# Patient Record
Sex: Female | Born: 1985 | Race: Black or African American | Hispanic: No | Marital: Married | Smoking: Never smoker
Health system: Southern US, Community
[De-identification: ages and names within clinical notes are randomized; demographics above are authoritative.]

## PROBLEM LIST (undated history)

## (undated) ENCOUNTER — Inpatient Hospital Stay (HOSPITAL_COMMUNITY): Payer: Self-pay

## (undated) DIAGNOSIS — B958 Unspecified staphylococcus as the cause of diseases classified elsewhere: Secondary | ICD-10-CM

## (undated) DIAGNOSIS — Z789 Other specified health status: Secondary | ICD-10-CM

## (undated) DIAGNOSIS — N200 Calculus of kidney: Secondary | ICD-10-CM

## (undated) DIAGNOSIS — N390 Urinary tract infection, site not specified: Secondary | ICD-10-CM

## (undated) DIAGNOSIS — I10 Essential (primary) hypertension: Secondary | ICD-10-CM

## (undated) DIAGNOSIS — Z3490 Encounter for supervision of normal pregnancy, unspecified, unspecified trimester: Secondary | ICD-10-CM

## (undated) DIAGNOSIS — Z87442 Personal history of urinary calculi: Secondary | ICD-10-CM

## (undated) DIAGNOSIS — Z973 Presence of spectacles and contact lenses: Secondary | ICD-10-CM

## (undated) DIAGNOSIS — T148XXA Other injury of unspecified body region, initial encounter: Secondary | ICD-10-CM

## (undated) DIAGNOSIS — N319 Neuromuscular dysfunction of bladder, unspecified: Secondary | ICD-10-CM

## (undated) DIAGNOSIS — Z8679 Personal history of other diseases of the circulatory system: Secondary | ICD-10-CM

## (undated) DIAGNOSIS — N329 Bladder disorder, unspecified: Secondary | ICD-10-CM

## (undated) HISTORY — PX: INTERSTIM IMPLANT REMOVAL: SHX5131

## (undated) HISTORY — PX: OTHER SURGICAL HISTORY: SHX169

## (undated) HISTORY — PX: SPINE SURGERY: SHX786

## (undated) HISTORY — PX: INDUCED ABORTION: SHX677

## (undated) HISTORY — PX: INTERSTIM IMPLANT PLACEMENT: SHX5130

## (undated) HISTORY — PX: EXTRACORPOREAL SHOCK WAVE LITHOTRIPSY: SHX1557

## (undated) HISTORY — PX: WISDOM TOOTH EXTRACTION: SHX21

## (undated) HISTORY — PX: LITHOTRIPSY: SUR834

## (undated) HISTORY — PX: ENDOMETRIAL ABLATION: SHX621

---
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
## 2006-02-21 ENCOUNTER — Emergency Department (HOSPITAL_COMMUNITY): Admission: EM | Admit: 2006-02-21 | Discharge: 2006-02-21 | Payer: Self-pay | Admitting: Family Medicine

## 2006-05-14 ENCOUNTER — Emergency Department (HOSPITAL_COMMUNITY): Admission: EM | Admit: 2006-05-14 | Discharge: 2006-05-14 | Payer: Self-pay | Admitting: Emergency Medicine

## 2006-10-21 ENCOUNTER — Emergency Department (HOSPITAL_COMMUNITY): Admission: EM | Admit: 2006-10-21 | Discharge: 2006-10-21 | Payer: Self-pay | Admitting: Family Medicine

## 2006-12-08 ENCOUNTER — Inpatient Hospital Stay (HOSPITAL_COMMUNITY): Admission: AD | Admit: 2006-12-08 | Discharge: 2006-12-08 | Payer: Self-pay | Admitting: Obstetrics & Gynecology

## 2006-12-24 ENCOUNTER — Ambulatory Visit: Payer: Self-pay | Admitting: Family Medicine

## 2006-12-24 ENCOUNTER — Encounter (INDEPENDENT_AMBULATORY_CARE_PROVIDER_SITE_OTHER): Payer: Self-pay | Admitting: Family Medicine

## 2006-12-24 LAB — CONVERTED CEMR LAB
Antibody Screen: NEGATIVE
Basophils Absolute: 0 10*3/uL (ref 0.0–0.1)
Basophils Relative: 0 % (ref 0–1)
Eosinophils Absolute: 0.1 10*3/uL — ABNORMAL LOW (ref 0.2–0.7)
Eosinophils Relative: 2 % (ref 0–5)
HCT: 37.3 % (ref 36.0–46.0)
Hemoglobin: 12 g/dL (ref 12.0–15.0)
Hepatitis B Surface Ag: NEGATIVE
Lymphocytes Relative: 33 % (ref 12–46)
Lymphs Abs: 1.5 10*3/uL (ref 0.7–4.0)
MCHC: 32.2 g/dL (ref 30.0–36.0)
MCV: 67.8 fL — ABNORMAL LOW (ref 78.0–100.0)
Monocytes Absolute: 0.3 10*3/uL (ref 0.1–1.0)
Monocytes Relative: 7 % (ref 3–12)
Neutro Abs: 2.6 10*3/uL (ref 1.7–7.7)
Neutrophils Relative %: 58 % (ref 43–77)
Platelets: 321 10*3/uL (ref 150–400)
RBC: 5.5 M/uL — ABNORMAL HIGH (ref 3.87–5.11)
RDW: 19.1 % — ABNORMAL HIGH (ref 11.5–15.5)
Rh Type: POSITIVE
Rubella: 91 intl units/mL — ABNORMAL HIGH
WBC: 4.5 10*3/uL (ref 4.0–10.5)

## 2006-12-25 ENCOUNTER — Encounter (INDEPENDENT_AMBULATORY_CARE_PROVIDER_SITE_OTHER): Payer: Self-pay | Admitting: Family Medicine

## 2006-12-31 ENCOUNTER — Encounter (INDEPENDENT_AMBULATORY_CARE_PROVIDER_SITE_OTHER): Payer: Self-pay | Admitting: Family Medicine

## 2006-12-31 ENCOUNTER — Encounter: Payer: Self-pay | Admitting: Family Medicine

## 2006-12-31 ENCOUNTER — Ambulatory Visit: Payer: Self-pay | Admitting: Family Medicine

## 2006-12-31 LAB — CONVERTED CEMR LAB: GC Culture Only: NEGATIVE

## 2007-01-01 LAB — CONVERTED CEMR LAB: GC Probe Amp, Genital: NEGATIVE

## 2007-01-27 ENCOUNTER — Telehealth (INDEPENDENT_AMBULATORY_CARE_PROVIDER_SITE_OTHER): Payer: Self-pay | Admitting: Family Medicine

## 2007-01-27 ENCOUNTER — Inpatient Hospital Stay (HOSPITAL_COMMUNITY): Admission: AD | Admit: 2007-01-27 | Discharge: 2007-01-28 | Payer: Self-pay | Admitting: Obstetrics & Gynecology

## 2007-01-28 ENCOUNTER — Encounter (INDEPENDENT_AMBULATORY_CARE_PROVIDER_SITE_OTHER): Payer: Self-pay | Admitting: *Deleted

## 2007-01-28 ENCOUNTER — Telehealth (INDEPENDENT_AMBULATORY_CARE_PROVIDER_SITE_OTHER): Payer: Self-pay | Admitting: Family Medicine

## 2007-01-29 ENCOUNTER — Telehealth: Payer: Self-pay | Admitting: *Deleted

## 2007-01-29 ENCOUNTER — Inpatient Hospital Stay (HOSPITAL_COMMUNITY): Admission: AD | Admit: 2007-01-29 | Discharge: 2007-01-29 | Payer: Self-pay | Admitting: Obstetrics & Gynecology

## 2007-01-30 ENCOUNTER — Inpatient Hospital Stay (HOSPITAL_COMMUNITY): Admission: AD | Admit: 2007-01-30 | Discharge: 2007-01-30 | Payer: Self-pay | Admitting: Obstetrics & Gynecology

## 2007-02-01 ENCOUNTER — Encounter (INDEPENDENT_AMBULATORY_CARE_PROVIDER_SITE_OTHER): Payer: Self-pay | Admitting: Family Medicine

## 2007-02-02 ENCOUNTER — Telehealth (INDEPENDENT_AMBULATORY_CARE_PROVIDER_SITE_OTHER): Payer: Self-pay | Admitting: Family Medicine

## 2007-04-28 ENCOUNTER — Inpatient Hospital Stay (HOSPITAL_COMMUNITY): Admission: AD | Admit: 2007-04-28 | Discharge: 2007-04-28 | Payer: Self-pay | Admitting: Obstetrics & Gynecology

## 2007-05-01 ENCOUNTER — Inpatient Hospital Stay (HOSPITAL_COMMUNITY): Admission: AD | Admit: 2007-05-01 | Discharge: 2007-05-01 | Payer: Self-pay | Admitting: Gynecology

## 2007-05-08 ENCOUNTER — Inpatient Hospital Stay (HOSPITAL_COMMUNITY): Admission: RE | Admit: 2007-05-08 | Discharge: 2007-05-08 | Payer: Self-pay | Admitting: Obstetrics & Gynecology

## 2007-06-09 ENCOUNTER — Inpatient Hospital Stay (HOSPITAL_COMMUNITY): Admission: AD | Admit: 2007-06-09 | Discharge: 2007-06-10 | Payer: Self-pay | Admitting: Obstetrics

## 2007-06-14 ENCOUNTER — Inpatient Hospital Stay (HOSPITAL_COMMUNITY): Admission: AD | Admit: 2007-06-14 | Discharge: 2007-06-14 | Payer: Self-pay | Admitting: Obstetrics

## 2007-06-16 ENCOUNTER — Observation Stay (HOSPITAL_COMMUNITY): Admission: AD | Admit: 2007-06-16 | Discharge: 2007-06-19 | Payer: Self-pay | Admitting: Obstetrics & Gynecology

## 2007-06-29 ENCOUNTER — Inpatient Hospital Stay (HOSPITAL_COMMUNITY): Admission: AD | Admit: 2007-06-29 | Discharge: 2007-06-29 | Payer: Self-pay | Admitting: Obstetrics & Gynecology

## 2007-07-24 ENCOUNTER — Inpatient Hospital Stay (HOSPITAL_COMMUNITY): Admission: AD | Admit: 2007-07-24 | Discharge: 2007-07-25 | Payer: Self-pay | Admitting: Obstetrics & Gynecology

## 2007-08-03 ENCOUNTER — Inpatient Hospital Stay (HOSPITAL_COMMUNITY): Admission: AD | Admit: 2007-08-03 | Discharge: 2007-08-03 | Payer: Self-pay | Admitting: Obstetrics

## 2007-08-05 ENCOUNTER — Encounter: Payer: Self-pay | Admitting: Obstetrics & Gynecology

## 2007-08-05 ENCOUNTER — Ambulatory Visit (HOSPITAL_COMMUNITY): Admission: RE | Admit: 2007-08-05 | Discharge: 2007-08-05 | Payer: Self-pay | Admitting: Obstetrics

## 2007-08-05 ENCOUNTER — Observation Stay (HOSPITAL_COMMUNITY): Admission: AD | Admit: 2007-08-05 | Discharge: 2007-08-06 | Payer: Self-pay | Admitting: Obstetrics & Gynecology

## 2007-09-24 ENCOUNTER — Emergency Department (HOSPITAL_COMMUNITY): Admission: EM | Admit: 2007-09-24 | Discharge: 2007-09-24 | Payer: Self-pay | Admitting: Emergency Medicine

## 2007-11-20 ENCOUNTER — Emergency Department (HOSPITAL_COMMUNITY): Admission: EM | Admit: 2007-11-20 | Discharge: 2007-11-20 | Payer: Self-pay | Admitting: Family Medicine

## 2007-11-26 ENCOUNTER — Emergency Department (HOSPITAL_COMMUNITY): Admission: EM | Admit: 2007-11-26 | Discharge: 2007-11-26 | Payer: Self-pay | Admitting: Emergency Medicine

## 2007-12-25 ENCOUNTER — Emergency Department (HOSPITAL_COMMUNITY): Admission: EM | Admit: 2007-12-25 | Discharge: 2007-12-25 | Payer: Self-pay | Admitting: Family Medicine

## 2008-03-18 ENCOUNTER — Emergency Department (HOSPITAL_COMMUNITY): Admission: EM | Admit: 2008-03-18 | Discharge: 2008-03-18 | Payer: Self-pay | Admitting: Emergency Medicine

## 2008-04-06 ENCOUNTER — Inpatient Hospital Stay (HOSPITAL_COMMUNITY): Admission: AD | Admit: 2008-04-06 | Discharge: 2008-04-06 | Payer: Self-pay | Admitting: Obstetrics & Gynecology

## 2008-04-09 ENCOUNTER — Inpatient Hospital Stay (HOSPITAL_COMMUNITY): Admission: AD | Admit: 2008-04-09 | Discharge: 2008-04-09 | Payer: Self-pay | Admitting: Obstetrics & Gynecology

## 2008-05-26 ENCOUNTER — Inpatient Hospital Stay (HOSPITAL_COMMUNITY): Admission: AD | Admit: 2008-05-26 | Discharge: 2008-05-26 | Payer: Self-pay | Admitting: Obstetrics & Gynecology

## 2008-06-15 ENCOUNTER — Encounter (INDEPENDENT_AMBULATORY_CARE_PROVIDER_SITE_OTHER): Payer: Self-pay | Admitting: Family Medicine

## 2008-06-15 ENCOUNTER — Ambulatory Visit: Payer: Self-pay | Admitting: Family Medicine

## 2008-06-15 DIAGNOSIS — N912 Amenorrhea, unspecified: Secondary | ICD-10-CM

## 2008-06-15 LAB — CONVERTED CEMR LAB
Antibody Screen: NEGATIVE
Anticardiolipin IgG: <7 (ref <11)
Anticardiolipin IgM: <7 (ref <10)
Basophils Absolute: 0 10*3/uL (ref 0.0–0.1)
Basophils Relative: 0 % (ref 0–1)
Beta hcg, urine, semiquantitative: POSITIVE
Eosinophils Absolute: 0.1 10*3/uL (ref 0.0–0.7)
Eosinophils Relative: 2 % (ref 0–5)
Hemoglobin: 11.7 g/dL — ABNORMAL LOW (ref 12.0–15.0)
Lymphocytes Relative: 36 % (ref 12–46)
Lymphs Abs: 1.8 10*3/uL (ref 0.7–4.0)
MCV: 67.9 fL — ABNORMAL LOW (ref 78.0–100.0)
Monocytes Relative: 7 % (ref 3–12)
Platelets: 296 10*3/uL (ref 150–400)
RBC: 4.96 M/uL (ref 3.87–5.11)
Rubella: 85 intl units/mL — ABNORMAL HIGH
Sickle Cell Screen: NEGATIVE

## 2008-06-16 ENCOUNTER — Encounter: Payer: Self-pay | Admitting: *Deleted

## 2008-06-16 ENCOUNTER — Encounter (INDEPENDENT_AMBULATORY_CARE_PROVIDER_SITE_OTHER): Payer: Self-pay | Admitting: Family Medicine

## 2008-06-18 ENCOUNTER — Ambulatory Visit (HOSPITAL_COMMUNITY): Admission: RE | Admit: 2008-06-18 | Discharge: 2008-06-18 | Payer: Self-pay | Admitting: Family Medicine

## 2008-06-28 ENCOUNTER — Telehealth (INDEPENDENT_AMBULATORY_CARE_PROVIDER_SITE_OTHER): Payer: Self-pay | Admitting: Family Medicine

## 2008-07-07 ENCOUNTER — Telehealth: Payer: Self-pay | Admitting: Family Medicine

## 2008-07-08 ENCOUNTER — Encounter: Payer: Self-pay | Admitting: Family Medicine

## 2008-07-08 ENCOUNTER — Other Ambulatory Visit: Admission: RE | Admit: 2008-07-08 | Discharge: 2008-07-08 | Payer: Self-pay | Admitting: Family Medicine

## 2008-07-08 ENCOUNTER — Ambulatory Visit: Payer: Self-pay | Admitting: Family Medicine

## 2008-07-08 DIAGNOSIS — O211 Hyperemesis gravidarum with metabolic disturbance: Secondary | ICD-10-CM | POA: Insufficient documentation

## 2008-07-08 LAB — CONVERTED CEMR LAB: Chlamydia, DNA Probe: NEGATIVE

## 2008-07-21 ENCOUNTER — Inpatient Hospital Stay (HOSPITAL_COMMUNITY): Admission: AD | Admit: 2008-07-21 | Discharge: 2008-07-21 | Payer: Self-pay | Admitting: Obstetrics & Gynecology

## 2008-07-21 ENCOUNTER — Ambulatory Visit: Payer: Self-pay | Admitting: Family

## 2008-07-22 ENCOUNTER — Ambulatory Visit: Payer: Self-pay | Admitting: Obstetrics & Gynecology

## 2008-07-22 ENCOUNTER — Encounter: Payer: Self-pay | Admitting: Family Medicine

## 2008-07-22 ENCOUNTER — Ambulatory Visit (HOSPITAL_COMMUNITY): Admission: RE | Admit: 2008-07-22 | Discharge: 2008-07-22 | Payer: Self-pay | Admitting: Obstetrics & Gynecology

## 2008-07-22 ENCOUNTER — Encounter: Payer: Self-pay | Admitting: Physician Assistant

## 2008-07-22 LAB — CONVERTED CEMR LAB
Trich, Wet Prep: NONE SEEN
WBC, Wet Prep HPF POC: NONE SEEN
Yeast Wet Prep HPF POC: NONE SEEN

## 2008-08-02 ENCOUNTER — Ambulatory Visit: Payer: Self-pay | Admitting: Obstetrics & Gynecology

## 2008-08-16 ENCOUNTER — Ambulatory Visit: Payer: Self-pay | Admitting: Obstetrics & Gynecology

## 2008-09-06 ENCOUNTER — Ambulatory Visit: Payer: Self-pay | Admitting: Obstetrics & Gynecology

## 2008-09-07 ENCOUNTER — Encounter: Payer: Self-pay | Admitting: Obstetrics & Gynecology

## 2008-09-09 ENCOUNTER — Ambulatory Visit (HOSPITAL_COMMUNITY): Admission: RE | Admit: 2008-09-09 | Discharge: 2008-09-09 | Payer: Self-pay | Admitting: Obstetrics & Gynecology

## 2008-10-04 ENCOUNTER — Ambulatory Visit: Payer: Self-pay | Admitting: Obstetrics & Gynecology

## 2008-11-01 ENCOUNTER — Ambulatory Visit: Payer: Self-pay | Admitting: Obstetrics & Gynecology

## 2008-11-15 ENCOUNTER — Ambulatory Visit: Payer: Self-pay | Admitting: Obstetrics & Gynecology

## 2008-12-01 ENCOUNTER — Ambulatory Visit: Payer: Self-pay | Admitting: Obstetrics & Gynecology

## 2008-12-01 ENCOUNTER — Encounter: Payer: Self-pay | Admitting: Physician Assistant

## 2008-12-01 LAB — CONVERTED CEMR LAB
MCV: 75.1 fL — ABNORMAL LOW (ref 78.0–100.0)
RBC: 4.33 M/uL (ref 3.87–5.11)
WBC: 6.4 10*3/uL (ref 4.0–10.5)

## 2008-12-08 ENCOUNTER — Encounter: Payer: Self-pay | Admitting: Obstetrics & Gynecology

## 2008-12-15 ENCOUNTER — Ambulatory Visit: Payer: Self-pay | Admitting: Obstetrics and Gynecology

## 2008-12-16 ENCOUNTER — Ambulatory Visit (HOSPITAL_COMMUNITY): Admission: RE | Admit: 2008-12-16 | Discharge: 2008-12-16 | Payer: Self-pay | Admitting: Obstetrics and Gynecology

## 2008-12-29 ENCOUNTER — Ambulatory Visit: Payer: Self-pay | Admitting: Obstetrics & Gynecology

## 2008-12-29 ENCOUNTER — Encounter: Payer: Self-pay | Admitting: Physician Assistant

## 2008-12-31 ENCOUNTER — Inpatient Hospital Stay (HOSPITAL_COMMUNITY): Admission: AD | Admit: 2008-12-31 | Discharge: 2008-12-31 | Payer: Self-pay | Admitting: Obstetrics and Gynecology

## 2009-01-12 ENCOUNTER — Encounter: Payer: Self-pay | Admitting: Advanced Practice Midwife

## 2009-01-12 ENCOUNTER — Ambulatory Visit: Payer: Self-pay | Admitting: Obstetrics and Gynecology

## 2009-01-17 ENCOUNTER — Ambulatory Visit: Payer: Self-pay | Admitting: Advanced Practice Midwife

## 2009-01-17 ENCOUNTER — Inpatient Hospital Stay (HOSPITAL_COMMUNITY): Admission: AD | Admit: 2009-01-17 | Discharge: 2009-01-17 | Payer: Self-pay | Admitting: Obstetrics & Gynecology

## 2009-01-18 ENCOUNTER — Inpatient Hospital Stay (HOSPITAL_COMMUNITY): Admission: AD | Admit: 2009-01-18 | Discharge: 2009-01-18 | Payer: Self-pay | Admitting: Obstetrics and Gynecology

## 2009-01-19 ENCOUNTER — Ambulatory Visit: Payer: Self-pay | Admitting: Obstetrics and Gynecology

## 2009-01-26 ENCOUNTER — Ambulatory Visit: Payer: Self-pay | Admitting: Obstetrics and Gynecology

## 2009-01-26 LAB — CONVERTED CEMR LAB
Chlamydia, DNA Probe: NEGATIVE
GC Probe Amp, Genital: NEGATIVE

## 2009-02-02 ENCOUNTER — Ambulatory Visit: Payer: Self-pay | Admitting: Obstetrics and Gynecology

## 2009-02-09 ENCOUNTER — Ambulatory Visit: Payer: Self-pay | Admitting: Obstetrics and Gynecology

## 2009-02-10 ENCOUNTER — Inpatient Hospital Stay (HOSPITAL_COMMUNITY): Admission: AD | Admit: 2009-02-10 | Discharge: 2009-02-12 | Payer: Self-pay | Admitting: Family Medicine

## 2009-02-10 ENCOUNTER — Encounter: Payer: Self-pay | Admitting: Family Medicine

## 2009-02-10 ENCOUNTER — Ambulatory Visit: Payer: Self-pay | Admitting: Advanced Practice Midwife

## 2009-02-10 DIAGNOSIS — R339 Retention of urine, unspecified: Secondary | ICD-10-CM

## 2009-03-03 ENCOUNTER — Encounter: Payer: Self-pay | Admitting: Family Medicine

## 2009-03-04 ENCOUNTER — Encounter: Payer: Self-pay | Admitting: Family Medicine

## 2010-03-14 NOTE — Consult Note (Signed)
[facility]  [facility]   Imported By: Clydell Hakim 03/07/2009 11:45:14  _____________________________________________________________________  External Attachment:    Type:   Image     Comment:   External Document

## 2010-03-14 NOTE — Consult Note (Signed)
[facility]   Imported By: Clydell Hakim 03/11/2009 13:59:26  _____________________________________________________________________  External Attachment:    Type:   Image     Comment:   External Document

## 2010-04-14 ENCOUNTER — Inpatient Hospital Stay (HOSPITAL_COMMUNITY)
Admission: AD | Admit: 2010-04-14 | Discharge: 2010-04-14 | Disposition: A | Discharge status: Home / Self Care | Payer: Medicaid Other | Source: Ambulatory Visit | Attending: Obstetrics & Gynecology | Admitting: Obstetrics & Gynecology

## 2010-04-14 DIAGNOSIS — N390 Urinary tract infection, site not specified: Secondary | ICD-10-CM | POA: Insufficient documentation

## 2010-04-14 DIAGNOSIS — R109 Unspecified abdominal pain: Secondary | ICD-10-CM | POA: Insufficient documentation

## 2010-04-14 LAB — URINALYSIS, ROUTINE W REFLEX MICROSCOPIC
Bilirubin Urine: NEGATIVE
Glucose, UA: NEGATIVE mg/dL
Hgb urine dipstick: NEGATIVE
Specific Gravity, Urine: 1.02 (ref 1.005–1.030)
Urobilinogen, UA: 0.2 mg/dL (ref 0.0–1.0)
pH: 6.5 (ref 5.0–8.0)

## 2010-04-14 LAB — URINE MICROSCOPIC-ADD ON

## 2010-04-16 LAB — URINE CULTURE: Culture: NO GROWTH

## 2010-05-15 LAB — POCT URINALYSIS DIP (DEVICE)
Bilirubin Urine: NEGATIVE
Bilirubin Urine: NEGATIVE
Glucose, UA: NEGATIVE mg/dL
Ketones, ur: NEGATIVE mg/dL
Nitrite: NEGATIVE
Protein, ur: NEGATIVE mg/dL
pH: 7 (ref 5.0–8.0)

## 2010-05-15 LAB — URINE MICROSCOPIC-ADD ON

## 2010-05-15 LAB — CBC
HCT: 33.5 % — ABNORMAL LOW (ref 36.0–46.0)
Platelets: 224 10*3/uL (ref 150–400)
WBC: 5.9 10*3/uL (ref 4.0–10.5)

## 2010-05-15 LAB — COMPREHENSIVE METABOLIC PANEL
ALT: 21 U/L (ref 0–35)
AST: 34 U/L (ref 0–37)
Albumin: 3 g/dL — ABNORMAL LOW (ref 3.5–5.2)
Alkaline Phosphatase: 186 U/L — ABNORMAL HIGH (ref 39–117)
BUN: 4 mg/dL — ABNORMAL LOW (ref 6–23)
Chloride: 105 mEq/L (ref 96–112)
Potassium: 3.9 mEq/L (ref 3.5–5.1)
Sodium: 134 mEq/L — ABNORMAL LOW (ref 135–145)
Total Bilirubin: 0.6 mg/dL (ref 0.3–1.2)

## 2010-05-15 LAB — URINALYSIS, ROUTINE W REFLEX MICROSCOPIC
Bilirubin Urine: NEGATIVE
Glucose, UA: NEGATIVE mg/dL
Specific Gravity, Urine: 1.01 (ref 1.005–1.030)
pH: 6.5 (ref 5.0–8.0)

## 2010-05-15 LAB — RPR: RPR Ser Ql: NONREACTIVE

## 2010-05-16 LAB — POCT URINALYSIS DIP (DEVICE)
Bilirubin Urine: NEGATIVE
Bilirubin Urine: NEGATIVE
Glucose, UA: NEGATIVE mg/dL
Glucose, UA: NEGATIVE mg/dL
Hgb urine dipstick: NEGATIVE
Ketones, ur: NEGATIVE mg/dL
Nitrite: NEGATIVE
Specific Gravity, Urine: 1.015 (ref 1.005–1.030)
Specific Gravity, Urine: 1.015 (ref 1.005–1.030)

## 2010-05-17 LAB — POCT URINALYSIS DIP (DEVICE)
Bilirubin Urine: NEGATIVE
Glucose, UA: NEGATIVE mg/dL
Hgb urine dipstick: NEGATIVE
Ketones, ur: NEGATIVE mg/dL
Protein, ur: NEGATIVE mg/dL
Specific Gravity, Urine: 1.02 (ref 1.005–1.030)
pH: 6 (ref 5.0–8.0)

## 2010-05-18 LAB — POCT URINALYSIS DIP (DEVICE)
Bilirubin Urine: NEGATIVE
Glucose, UA: NEGATIVE mg/dL
Glucose, UA: NEGATIVE mg/dL
Hgb urine dipstick: NEGATIVE
Hgb urine dipstick: NEGATIVE
Ketones, ur: NEGATIVE mg/dL
Ketones, ur: NEGATIVE mg/dL
Nitrite: NEGATIVE
Specific Gravity, Urine: 1.015 (ref 1.005–1.030)
Specific Gravity, Urine: 1.015 (ref 1.005–1.030)
Urobilinogen, UA: 1 mg/dL (ref 0.0–1.0)
pH: 7 (ref 5.0–8.0)

## 2010-05-19 LAB — POCT URINALYSIS DIP (DEVICE)
Bilirubin Urine: NEGATIVE
Hgb urine dipstick: NEGATIVE
Ketones, ur: NEGATIVE mg/dL
Protein, ur: NEGATIVE mg/dL
Specific Gravity, Urine: 1.015 (ref 1.005–1.030)
pH: 7 (ref 5.0–8.0)

## 2010-05-20 LAB — POCT URINALYSIS DIP (DEVICE)
Hgb urine dipstick: NEGATIVE
Ketones, ur: NEGATIVE mg/dL
Protein, ur: NEGATIVE mg/dL
Specific Gravity, Urine: 1.015 (ref 1.005–1.030)
Urobilinogen, UA: 0.2 mg/dL (ref 0.0–1.0)
pH: 6.5 (ref 5.0–8.0)

## 2010-05-21 LAB — POCT URINALYSIS DIP (DEVICE)
Glucose, UA: NEGATIVE mg/dL
Hgb urine dipstick: NEGATIVE
Nitrite: NEGATIVE
Specific Gravity, Urine: 1.02 (ref 1.005–1.030)
Urobilinogen, UA: 1 mg/dL (ref 0.0–1.0)
pH: 6 (ref 5.0–8.0)

## 2010-05-22 LAB — URINALYSIS, ROUTINE W REFLEX MICROSCOPIC
Bilirubin Urine: NEGATIVE
Hgb urine dipstick: NEGATIVE
Ketones, ur: NEGATIVE mg/dL
Specific Gravity, Urine: 1.01 (ref 1.005–1.030)
pH: 7.5 (ref 5.0–8.0)

## 2010-05-22 LAB — POCT URINALYSIS DIP (DEVICE)
Bilirubin Urine: NEGATIVE
Bilirubin Urine: NEGATIVE
Hgb urine dipstick: NEGATIVE
Hgb urine dipstick: NEGATIVE
Ketones, ur: NEGATIVE mg/dL
Nitrite: NEGATIVE
Protein, ur: NEGATIVE mg/dL
Protein, ur: 30 mg/dL — AB
pH: 7 (ref 5.0–8.0)
pH: 7.5 (ref 5.0–8.0)

## 2010-05-24 LAB — URINE CULTURE: Colony Count: >=100000

## 2010-05-24 LAB — URINALYSIS, ROUTINE W REFLEX MICROSCOPIC
Bilirubin Urine: NEGATIVE
Glucose, UA: NEGATIVE mg/dL
Hgb urine dipstick: NEGATIVE
Specific Gravity, Urine: 1.025 (ref 1.005–1.030)
pH: 6 (ref 5.0–8.0)

## 2010-05-30 LAB — URINALYSIS, ROUTINE W REFLEX MICROSCOPIC
Glucose, UA: NEGATIVE mg/dL
Ketones, ur: NEGATIVE mg/dL
pH: 6 (ref 5.0–8.0)

## 2010-05-30 LAB — GC/CHLAMYDIA PROBE AMP, GENITAL: GC Probe Amp, Genital: NEGATIVE

## 2010-05-30 LAB — CBC
MCHC: 33.1 g/dL (ref 30.0–36.0)
Platelets: 275 10*3/uL (ref 150–400)
RDW: 16.7 % — ABNORMAL HIGH (ref 11.5–15.5)

## 2010-05-30 LAB — HCG, QUANTITATIVE, PREGNANCY: hCG, Beta Chain, Quant, S: 28 m[IU]/mL — ABNORMAL HIGH (ref <5)

## 2010-05-30 LAB — URINALYSIS, MICROSCOPIC ONLY
Bilirubin Urine: NEGATIVE
Hgb urine dipstick: NEGATIVE
Specific Gravity, Urine: 1.026 (ref 1.005–1.030)
Urobilinogen, UA: 1 mg/dL (ref 0.0–1.0)

## 2010-05-30 LAB — URINE CULTURE: Colony Count: >=100000

## 2010-05-30 LAB — URINE MICROSCOPIC-ADD ON

## 2010-05-30 LAB — POCT URINALYSIS DIP (DEVICE)
Bilirubin Urine: NEGATIVE
Hgb urine dipstick: NEGATIVE
Nitrite: NEGATIVE
Protein, ur: 30 mg/dL — AB
Urobilinogen, UA: 0.2 mg/dL (ref 0.0–1.0)
pH: 7 (ref 5.0–8.0)

## 2010-06-24 ENCOUNTER — Inpatient Hospital Stay (HOSPITAL_COMMUNITY)
Admission: AD | Admit: 2010-06-24 | Discharge: 2010-06-24 | Disposition: A | Discharge status: Home / Self Care | Payer: Medicaid Other | Source: Ambulatory Visit | Attending: Obstetrics & Gynecology | Admitting: Obstetrics & Gynecology

## 2010-06-24 DIAGNOSIS — N390 Urinary tract infection, site not specified: Secondary | ICD-10-CM | POA: Insufficient documentation

## 2010-06-24 DIAGNOSIS — R112 Nausea with vomiting, unspecified: Secondary | ICD-10-CM

## 2010-06-24 DIAGNOSIS — R109 Unspecified abdominal pain: Secondary | ICD-10-CM

## 2010-06-24 LAB — URINALYSIS, ROUTINE W REFLEX MICROSCOPIC
Bilirubin Urine: NEGATIVE
Glucose, UA: NEGATIVE mg/dL
Ketones, ur: NEGATIVE mg/dL
Protein, ur: >300 mg/dL — AB

## 2010-06-24 LAB — URINE MICROSCOPIC-ADD ON

## 2010-06-27 NOTE — Consult Note (Signed)
Hayley Calhoun, RANDLEMAN               ACCOUNT NO.:  [id number]   MEDICAL RECORD NO.:  [id number]          PATIENT TYPE:  INP   LOCATION:  9302                          FACILITY:  [facility]   PHYSICIAN:  Sigmund I. Patsi Sears, M.D.DATE OF BIRTH:  01-14-1986   DATE OF CONSULTATION:  DATE OF DISCHARGE:                                 CONSULTATION   CHIEF COMPLAINT:  Inability to voiding and urinary retention.   Consult was called by Dr. Clearance Coots [facility].  The patient was admitted on  Jun 16, 2007, with inability to void, burning and pain in lower abdomen  in pubic area, and PVR post-renal ultrasound showed bladder capacity of  666 mL.   HISTORY OF PRESENT ILLNESS:  This is a healthy 25 year old female that  is antepartum of a 11 weeks'.  The patient is G3, P1-1-1.  The patient  uses nothing for birth control.  She states she has been having  difficulty emptying her bladder x1 week.  She empties small amounts at a  time until about 36 hours ago when she cannot empty all.  A lower  abdominal pain until cath in MAU.  At that point, she was cath with a 16-  French catheter and there was approximately 700 mL removed from her  bladder.   PAST MEDICAL HISTORY:  Last normal period was March 28, 2007.  Significant history of positive gonorrhea.   FAMILY HISTORY:  Noncontributory.   SURGICAL HISTORY:  Noncontributory.   SOCIAL HISTORY:  She is a nonsmoker.  Nondrinker.  Denies drug abuse.  Lives with relatives.  She is a Consulting civil engineer.  She is single.   PHYSICAL EXAMINATION:  GENERAL:  She is a well-developed, well-nourished  thin African American female in no acute distress.  VITAL SIGNS:  Stable.  The patient's temp is 98.3, pulse is 75,  respirations 18, blood pressure 117/67, and room air sat is 100.  HEENT:  Negative.  NECK:  Supple and nontender.  No nodes.  HEART:  Regular rhythm and rate.  LUNGS:  Clear to P&A.  ABDOMEN:  Soft and nontender.  Positive bowel sounds x4 without  organomegaly or  masses.  There is no suprapubic tenderness at this time.  GU:  Shows normal female genitalia.  Normal meatus.  Normal labia.  Normal vagina.  RECTAL:  Deferred.  SKIN:  Normal to inspection.   LABORATORY DATA:  Show a negative urine culture.  GC, Chlamydia prep is  negative.  Wet prep is negative.  UA shows bacteria few, wbc's 0-2, pH  is 7.5, and it is nitrite negative.  Radiology results; renal ultrasound  shows a right kidney that measures a 11.7 cm with a right upper pole is  measuring 2.9 x 2.7 x 2.7 cm.  Left kidney measures a 11 cm.  Mild  prominence of the renal pelvis is noted bilaterally without overt  hydronephrosis.  No shadowing renal calculi is identified.  The bladder  is grossly distended with bilateral ureteral jets identified.  Postvoid  volume measures 666 mL with dependent mobile debris noted.   IMPRESSION:  Mild renal pelvis fullness without  overt hydronephrosis in  the setting of a grossly distended bladder.  Bilateral ureteral jets  were visualized.   ASSESSMENT:  Urinary retention.   PLAN:  Discontinued Foley.  Treat the patient a self catheterization.  Plan is to have the patient attempt to void prior to self  catheterization and she is able to void.  Please bladder scan the  patient if less than 300 mL left in the bladder then have the patient  not to cath unless she becomes distended or unable to void.  The patient  was instructed to void to self cath at least every 6 hours if unable to  void naturally.  Plan is to discharge the patient at OB/GYN discussion  and follow up with Dr. Patsi Sears in the office.      [name], NP      Sigmund I. Patsi Sears, M.D.  Electronically Signed    DW/MEDQ  D:  06/18/2007  T:  06/19/2007  Job:  [id number]

## 2010-11-06 LAB — PREGNANCY, URINE: Preg Test, Ur: POSITIVE

## 2010-11-06 LAB — URINALYSIS, ROUTINE W REFLEX MICROSCOPIC
Bilirubin Urine: NEGATIVE
Ketones, ur: NEGATIVE
Nitrite: POSITIVE — AB
Specific Gravity, Urine: 1.02
Urobilinogen, UA: 0.2

## 2010-11-06 LAB — CBC
Hemoglobin: 11.6 — ABNORMAL LOW
MCHC: 33.2
MCV: 71.3 — ABNORMAL LOW
RBC: 4.91

## 2010-11-06 LAB — URINE MICROSCOPIC-ADD ON

## 2010-11-06 LAB — WET PREP, GENITAL
Clue Cells Wet Prep HPF POC: NONE SEEN
Trich, Wet Prep: NONE SEEN

## 2010-11-06 LAB — HCG, QUANTITATIVE, PREGNANCY: hCG, Beta Chain, Quant, S: 1620 — ABNORMAL HIGH

## 2010-11-07 LAB — URINE MICROSCOPIC-ADD ON

## 2010-11-07 LAB — URINALYSIS, ROUTINE W REFLEX MICROSCOPIC
Ketones, ur: NEGATIVE
Leukocytes, UA: NEGATIVE
Nitrite: NEGATIVE
Protein, ur: NEGATIVE

## 2010-11-07 LAB — URINE CULTURE: Colony Count: NO GROWTH

## 2010-11-08 LAB — WET PREP, GENITAL: Yeast Wet Prep HPF POC: NONE SEEN

## 2010-11-08 LAB — CBC
MCV: 74.3 — ABNORMAL LOW
Platelets: 278
RDW: 20.3 — ABNORMAL HIGH
WBC: 7.6

## 2010-11-09 LAB — URINE MICROSCOPIC-ADD ON

## 2010-11-09 LAB — ABO/RH: ABO/RH(D): O POS

## 2010-11-09 LAB — LUPUS ANTICOAGULANT PANEL
DRVVT: 42.6 (ref 36.1–47.0)
Lupus Anticoagulant: NOT DETECTED

## 2010-11-09 LAB — GC/CHLAMYDIA PROBE AMP, GENITAL: GC Probe Amp, Genital: NEGATIVE

## 2010-11-09 LAB — URINALYSIS, ROUTINE W REFLEX MICROSCOPIC
Bilirubin Urine: NEGATIVE
Glucose, UA: NEGATIVE
Ketones, ur: NEGATIVE
Nitrite: NEGATIVE
Protein, ur: NEGATIVE
Specific Gravity, Urine: 1.025
Specific Gravity, Urine: 1.01
Urobilinogen, UA: 0.2
pH: 6

## 2010-11-09 LAB — BETA-2-GLYCOPROTEIN I ABS, IGG/M/A
Beta-2 Glyco I IgG: 8 U/mL (ref <20)
Beta-2-Glycoprotein I IgA: 18 U/mL (ref <10)
Beta-2-Glycoprotein I IgM: 7 U/mL (ref <10)

## 2010-11-09 LAB — URINE CULTURE

## 2010-11-09 LAB — CBC
Hemoglobin: 11.5 — ABNORMAL LOW
MCHC: 34.3
MCV: 76.4 — ABNORMAL LOW
Platelets: 259
RBC: 4.39
RDW: 17.3 — ABNORMAL HIGH
WBC: 14.4 — ABNORMAL HIGH

## 2010-11-09 LAB — WET PREP, GENITAL: Trich, Wet Prep: NONE SEEN

## 2010-11-10 LAB — URINE MICROSCOPIC-ADD ON

## 2010-11-10 LAB — DIFFERENTIAL
Basophils Absolute: 0.1
Basophils Relative: 1
Eosinophils Absolute: 0
Eosinophils Relative: 1
Monocytes Absolute: 0.3

## 2010-11-10 LAB — CBC
HCT: 38.4
Platelets: 269
WBC: 6.8

## 2010-11-10 LAB — COMPREHENSIVE METABOLIC PANEL
ALT: 24
AST: 28
Albumin: 3.5
Alkaline Phosphatase: 37 — ABNORMAL LOW
BUN: 5 — ABNORMAL LOW
Chloride: 111
GFR calc Af Amer: >60
Potassium: 3.4 — ABNORMAL LOW
Total Bilirubin: 0.7

## 2010-11-10 LAB — PREGNANCY, URINE: Preg Test, Ur: NEGATIVE

## 2010-11-10 LAB — URINALYSIS, ROUTINE W REFLEX MICROSCOPIC
Glucose, UA: 100 — AB
Ketones, ur: 15 — AB
Protein, ur: >300 — AB

## 2010-11-13 LAB — POCT URINALYSIS DIP (DEVICE)
Glucose, UA: NEGATIVE
Ketones, ur: NEGATIVE
Operator id: 247071
Specific Gravity, Urine: 1.02

## 2010-11-14 LAB — POCT URINALYSIS DIP (DEVICE)
Ketones, ur: NEGATIVE mg/dL
Nitrite: POSITIVE — AB
Operator id: 239701
Protein, ur: >=300 mg/dL — AB

## 2010-11-14 LAB — URINE CULTURE: Colony Count: 3000

## 2010-11-14 LAB — POCT PREGNANCY, URINE: Preg Test, Ur: NEGATIVE

## 2010-11-17 LAB — CBC
MCHC: 33.4
MCV: 71.6 — ABNORMAL LOW
Platelets: 306
RDW: 19.3 — ABNORMAL HIGH
WBC: 5.7

## 2010-11-17 LAB — URINALYSIS, ROUTINE W REFLEX MICROSCOPIC
Ketones, ur: NEGATIVE
Nitrite: NEGATIVE
Protein, ur: NEGATIVE

## 2010-11-17 LAB — URINE CULTURE: Colony Count: >=100000

## 2010-11-17 LAB — HERPES SIMPLEX VIRUS CULTURE

## 2010-11-17 LAB — URINE MICROSCOPIC-ADD ON

## 2010-11-17 LAB — WET PREP, GENITAL
Clue Cells Wet Prep HPF POC: NONE SEEN
Trich, Wet Prep: NONE SEEN

## 2010-11-17 LAB — DIFFERENTIAL
Basophils Relative: 0
Eosinophils Absolute: 0.2
Neutrophils Relative %: 60

## 2010-11-17 LAB — ABO/RH: ABO/RH(D): O POS

## 2010-11-24 LAB — POCT PREGNANCY, URINE: Preg Test, Ur: NEGATIVE

## 2010-11-24 LAB — POCT URINALYSIS DIP (DEVICE)
Bilirubin Urine: NEGATIVE
Glucose, UA: NEGATIVE
Nitrite: POSITIVE — AB
Operator id: 235561

## 2010-11-24 LAB — GC/CHLAMYDIA PROBE AMP, GENITAL
Chlamydia, DNA Probe: NEGATIVE
GC Probe Amp, Genital: NEGATIVE

## 2010-11-24 LAB — WET PREP, GENITAL: Yeast Wet Prep HPF POC: NONE SEEN

## 2011-04-18 ENCOUNTER — Encounter (HOSPITAL_COMMUNITY): Payer: Self-pay | Admitting: *Deleted

## 2011-04-18 ENCOUNTER — Inpatient Hospital Stay (HOSPITAL_COMMUNITY): Payer: Medicaid Other

## 2011-04-18 ENCOUNTER — Inpatient Hospital Stay (HOSPITAL_COMMUNITY)
Admission: AD | Admit: 2011-04-18 | Discharge: 2011-04-19 | Disposition: A | Discharge status: Home / Self Care | Payer: Medicaid Other | Source: Ambulatory Visit | Attending: Obstetrics & Gynecology | Admitting: Obstetrics & Gynecology

## 2011-04-18 DIAGNOSIS — O209 Hemorrhage in early pregnancy, unspecified: Secondary | ICD-10-CM | POA: Insufficient documentation

## 2011-04-18 HISTORY — DX: Bladder disorder, unspecified: N32.9

## 2011-04-18 NOTE — ED Provider Notes (Signed)
History     Chief Complaint  Patient presents with  . Vaginal Bleeding   HPI 26 y.o. [id number] at [redacted]w[redacted]d. C/O small amount of vaginal bleeding tonight, brown spotting this morning. Had pap smear yesterday. Very slight cramping today.    Past Medical History  Diagnosis Date  . Spina bifida   . Bladder disorder     Past Surgical History  Procedure Date  . Spina bifida   . Lithotripsy     History reviewed. No pertinent family history.  History  Substance Use Topics  . Smoking status: Never Smoker   . Smokeless tobacco: Not on file  . Alcohol Use: No    Allergies:  Allergies  Allergen Reactions  . Macrobid Nausea And Vomiting  . Penicillins     Reaction unknown    Prescriptions prior to admission  Medication Sig Dispense Refill  . Prenatal Vit-Fe Fumarate-FA (PRENATAL MULTIVITAMIN) TABS Take 1 tablet by mouth daily.        Review of Systems  Constitutional: Negative.   Respiratory: Negative.   Cardiovascular: Negative.   Gastrointestinal: Negative for nausea, vomiting, abdominal pain, diarrhea and constipation.  Genitourinary: Negative for dysuria, urgency, frequency, hematuria and flank pain.       Positive for vaginal bleeding  Musculoskeletal: Negative.   Neurological: Negative.   Psychiatric/Behavioral: Negative.    Physical Exam   There were no vitals taken for this visit.  Physical Exam  Nursing note and vitals reviewed. Constitutional: She is oriented to person, place, and time. She appears well-developed and well-nourished. No distress.  HENT:  Head: Normocephalic and atraumatic.  Cardiovascular: Normal rate, regular rhythm and normal heart sounds.   Respiratory: Effort normal and breath sounds normal. No respiratory distress.  GI: Soft. Bowel sounds are normal. She exhibits no distension and no mass. There is no tenderness. There is no rebound and no guarding.  Genitourinary: There is no rash or lesion on the right labia. There is no rash or lesion  on the left labia. Uterus is not deviated, not enlarged, not fixed and not tender. Cervix exhibits no motion tenderness, no discharge and no friability. Right adnexum displays no mass, no tenderness and no fullness. Left adnexum displays no mass, no tenderness and no fullness. There is bleeding (small) around the vagina. No erythema or tenderness around the vagina. No vaginal discharge found.  Neurological: She is alert and oriented to person, place, and time.  Skin: Skin is warm and dry.  Psychiatric: She has a normal mood and affect.    MAU Course  Procedures  Unable to visualize IUP with bedside u/s.   US Ob Comp Less 14 Wks  04/18/2011  *RADIOLOGY REPORT*  Clinical Data: Early pregnancy, spotting; no quantitative beta HCG for correlation  OBSTETRIC <14 WK [place] AND TRANSVAGINAL OB US  Technique:  Both transabdominal and transvaginal ultrasound examinations were performed for complete evaluation of the gestation as well as the maternal uterus, adnexal regions, and pelvic cul-de-sac.  Transvaginal technique was performed to assess early pregnancy.  Comparison:  None for this pregnancy  Intrauterine gestational sac:  Visualized, normal morphology and position Yolk sac: Not identified Embryo: Not identified Cardiac Activity: Not identified Heart Rate: N/A bpm  MSD: 15.3 mm    6  w   5  d   [place] EDC: 12/06/2011  Maternal uterus/adnexae: No subchorionic hemorrhage. Trace free pelvic fluid. Right ovary normal size and morphology 2.8 x 1.3 x 1.6 cm. Left ovary measures 3.9 x 3.5 x 3.1  cm contains a corpus luteal cyst. No adnexal masses.  IMPRESSION: Small gestational sac within uterus, corresponding to a gestational age of [redacted] weeks 5 days by mean sac diameter. No fetal pole or yolk sac are identified; recommend follow-up ultrasound in 10 to 14 days to establish fetal viability.  Original Report Authenticated By: Lollie Marrow, M.D.   US Ob Transvaginal  04/18/2011  *RADIOLOGY REPORT*  Clinical Data: Early  pregnancy, spotting; no quantitative beta HCG for correlation  OBSTETRIC <14 WK [place] AND TRANSVAGINAL OB US  Technique:  Both transabdominal and transvaginal ultrasound examinations were performed for complete evaluation of the gestation as well as the maternal uterus, adnexal regions, and pelvic cul-de-sac.  Transvaginal technique was performed to assess early pregnancy.  Comparison:  None for this pregnancy  Intrauterine gestational sac:  Visualized, normal morphology and position Yolk sac: Not identified Embryo: Not identified Cardiac Activity: Not identified Heart Rate: N/A bpm  MSD: 15.3 mm    6  w   5  d   [place] EDC: 12/06/2011  Maternal uterus/adnexae: No subchorionic hemorrhage. Trace free pelvic fluid. Right ovary normal size and morphology 2.8 x 1.3 x 1.6 cm. Left ovary measures 3.9 x 3.5 x 3.1 cm contains a corpus luteal cyst. No adnexal masses.  IMPRESSION: Small gestational sac within uterus, corresponding to a gestational age of [redacted] weeks 5 days by mean sac diameter. No fetal pole or yolk sac are identified; recommend follow-up ultrasound in 10 to 14 days to establish fetal viability.  Original Report Authenticated By: Lollie Marrow, M.D.    Assessment and Plan  26 y.o. [id number] with bleeding in early pregnancy F/U in office, call for appointment  [facility] 04/18/2011, 10:33 PM

## 2011-04-18 NOTE — Progress Notes (Signed)
Brownish discharge this am, then tonight had bright red blood on her underwear. Cramping off/on . LMP 02/12/2011

## 2011-04-18 NOTE — ED Notes (Signed)
N. Bascom Levels CNM in to perform bedside U/S.

## 2011-04-19 DIAGNOSIS — O039 Complete or unspecified spontaneous abortion without complication: Secondary | ICD-10-CM

## 2011-04-20 ENCOUNTER — Inpatient Hospital Stay (HOSPITAL_COMMUNITY): Payer: Medicaid Other

## 2011-04-20 ENCOUNTER — Encounter (HOSPITAL_COMMUNITY): Payer: Self-pay

## 2011-04-20 ENCOUNTER — Inpatient Hospital Stay (HOSPITAL_COMMUNITY)
Admission: AD | Admit: 2011-04-20 | Discharge: 2011-04-20 | Disposition: A | Discharge status: Home / Self Care | Payer: Medicaid Other | Source: Ambulatory Visit | Attending: Obstetrics | Admitting: Obstetrics

## 2011-04-20 DIAGNOSIS — O039 Complete or unspecified spontaneous abortion without complication: Secondary | ICD-10-CM | POA: Insufficient documentation

## 2011-04-20 NOTE — Discharge Instructions (Signed)
Miscarriage  An early pregnancy loss or spontaneous abortion (miscarriage) is a common problem. This usually happens when the pregnancy is not developing normally. It is very unlikely that you or your partner did anything to cause this, although cigarette smoking, a sexually transmitted disease, excessive alcohol use, or drug abuse can increase the risk. Other causes are:   Abnormalities of the uterus.   Hormone or medical problems.   Trauma or genetic (chromosome) problems.  Having a miscarriage does not change your chances of having a normal pregnancy in the future. Your caregiver will advise you when it is safe to try to get pregnant again.  AFTER A MISCARRIAGE   A miscarriage is inevitable when there is continual, heavy vaginal bleeding; cramping; dilation of the cervix; or passing of any pregnancy tissue. Bleeding and cramping will usually continue until all the tissue has been removed from the womb (uterus).   Often the uterus does not clean itself out completely. A medication or a D&C procedure is needed to loosen or remove the pregnancy tissue from the uterus. A D&C scrapes or suctions the tissue out.   If you are RH negative, you may need to have Rh immune globulin to avoid Rh problems.   You may be given medication to fight an infection if the miscarriage was due to an infection.  HOME CARE INSTRUCTIONS    You should rest in bed for the next 2 to 3 days.   Do not take tub baths or put anything in your vagina, including tampons or a douche.   Do not have sex until your caregiver approves.   Avoid exercise or heavy activities until directed by your caregiver.   Save any vaginal discharge that looks like tissue. Ask your caregiver if he or she wants to inspect the discharge.   If you and your partner are having problems with guilt or grieving, talk to your caregiver or get counseling to help you understand and cope with your pregnancy loss.   Allow enough time to grieve before trying to get  pregnant again.  SEEK IMMEDIATE MEDICAL CARE IF:    You have persistent heavy bleeding or a bad smelling vaginal discharge.   You have continued abdominal or pelvic pain.   You have an oral temperature above 102 F (38.9 C), not controlled by medicine.   You have severe weakness, fainting, or keep throwing up (vomiting).   You develop chills.   You are experiencing domestic violence.  MAKE SURE YOU:    Understand these instructions.   Will watch your condition.   Will get help right away if you are not doing well or get worse.  Document Released: 03/08/2004 Document Revised: 01/18/2011 Document Reviewed: 01/22/2008  Exit[employer] Patient Information 2012 [employer].

## 2011-04-20 NOTE — Procedures (Addendum)
Patient was given comfort care pillow, emotional support with references for grief counseling.

## 2011-04-20 NOTE — Progress Notes (Signed)
Patient states she has been having some cramping and passed something in the toilet. Light bleeding.

## 2011-04-20 NOTE — Progress Notes (Signed)
Patient brought tissue that was passed at home placed in pathology container.

## 2011-04-20 NOTE — ED Provider Notes (Signed)
History     Chief Complaint  Patient presents with  . Vaginal Bleeding   HPI Pt here with report of bleeding that started three days ago.  Began cramping this morning and passed some tissue.  Seen in MAU on 04/18/11.   Blood type:  O+; Ultrasound on 04/18/11:  Small gestational sac within uterus, corresponding to a gestational age of [redacted] weeks 5 days by mean sac diameter.  No fetal pole or yolk sac are identified; recommend follow-up ultrasound in 10 to 14 days to establish fetal viability.   Past Medical History  Diagnosis Date  . Spina bifida   . Bladder disorder     Past Surgical History  Procedure Date  . Spina bifida   . Lithotripsy     No family history on file.  History  Substance Use Topics  . Smoking status: Never Smoker   . Smokeless tobacco: Not on file  . Alcohol Use: No    Allergies:  Allergies  Allergen Reactions  . Macrobid Nausea And Vomiting  . Penicillins     Reaction unknown    Prescriptions prior to admission  Medication Sig Dispense Refill  . acetaminophen (TYLENOL) 500 MG tablet Take 1,000 mg by mouth every 6 (six) hours as needed. Takes for pain      . Prenatal Vit-Fe Fumarate-FA (PRENATAL MULTIVITAMIN) TABS Take 1 tablet by mouth daily.        Review of Systems  Gastrointestinal: Positive for abdominal pain (cramping).  Genitourinary:       Vaginal bleeding  All other systems reviewed and are negative.   Physical Exam   Blood pressure 122/75, pulse 68, temperature 98.3 F (36.8 C), temperature source Oral, resp. rate 16, height 5\' 4"  (1.626 m), weight 58.695 kg (129 lb 6.4 oz), last menstrual period 02/12/2011, SpO2 100.00%.  Physical Exam  Constitutional: She is oriented to person, place, and time. She appears well-developed and well-nourished. No distress.  HENT:  Head: Normocephalic.  Neck: Normal range of motion. Neck supple.  Cardiovascular: Normal rate, regular rhythm and normal heart sounds.   Respiratory: Effort normal and  breath sounds normal. No respiratory distress.  GI: Soft. There is no tenderness.  Genitourinary: There is bleeding (scant) around the vagina.  Neurological: She is alert and oriented to person, place, and time.  Skin: Skin is warm and dry.    MAU Course  Procedures  Ultrasound:  No intrauterine sac seen  Assessment and Plan  Complete Miscarriage  Plan: DC to home Bleeding precautions  [facility] 04/20/2011, 9:18 AM

## 2011-04-20 NOTE — ED Provider Notes (Signed)
Attestation of Attending Supervision of Advanced Practitioner: Evaluation and management procedures were performed by the PA/NP/CNM/OB Fellow under my supervision/collaboration. Chart reviewed, and agree with management and plan.  Jaynie Collins, M.D. 04/20/2011 2:39 PM

## 2011-08-02 ENCOUNTER — Inpatient Hospital Stay (HOSPITAL_COMMUNITY)
Admission: AD | Admit: 2011-08-02 | Discharge: 2011-08-02 | Disposition: A | Discharge status: Home / Self Care | Payer: Medicaid Other | Source: Ambulatory Visit | Attending: Obstetrics & Gynecology | Admitting: Obstetrics & Gynecology

## 2011-08-02 ENCOUNTER — Encounter (HOSPITAL_COMMUNITY): Payer: Self-pay | Admitting: *Deleted

## 2011-08-02 DIAGNOSIS — R5381 Other malaise: Secondary | ICD-10-CM | POA: Insufficient documentation

## 2011-08-02 DIAGNOSIS — R110 Nausea: Secondary | ICD-10-CM | POA: Insufficient documentation

## 2011-08-02 DIAGNOSIS — N390 Urinary tract infection, site not specified: Secondary | ICD-10-CM

## 2011-08-02 DIAGNOSIS — R42 Dizziness and giddiness: Secondary | ICD-10-CM | POA: Insufficient documentation

## 2011-08-02 DIAGNOSIS — N926 Irregular menstruation, unspecified: Secondary | ICD-10-CM

## 2011-08-02 HISTORY — DX: Urinary tract infection, site not specified: N39.0

## 2011-08-02 LAB — URINALYSIS, ROUTINE W REFLEX MICROSCOPIC
Bilirubin Urine: NEGATIVE
Glucose, UA: NEGATIVE mg/dL
Hgb urine dipstick: NEGATIVE
Specific Gravity, Urine: >1.03 — ABNORMAL HIGH (ref 1.005–1.030)
Urobilinogen, UA: 0.2 mg/dL (ref 0.0–1.0)
pH: 6 (ref 5.0–8.0)

## 2011-08-02 LAB — URINE MICROSCOPIC-ADD ON

## 2011-08-02 LAB — CBC
Hemoglobin: 11.3 g/dL — ABNORMAL LOW (ref 12.0–15.0)
MCH: 23.3 pg — ABNORMAL LOW (ref 26.0–34.0)
Platelets: 232 10*3/uL (ref 150–400)
RBC: 4.86 MIL/uL (ref 3.87–5.11)
WBC: 4.3 10*3/uL (ref 4.0–10.5)

## 2011-08-02 LAB — COMPREHENSIVE METABOLIC PANEL
ALT: 14 U/L (ref 0–35)
AST: 20 U/L (ref 0–37)
Alkaline Phosphatase: 38 U/L — ABNORMAL LOW (ref 39–117)
CO2: 28 mEq/L (ref 19–32)
Calcium: 9.2 mg/dL (ref 8.4–10.5)
Chloride: 101 mEq/L (ref 96–112)
GFR calc Af Amer: >90 mL/min (ref >90)
GFR calc non Af Amer: >90 mL/min (ref >90)
Glucose, Bld: 76 mg/dL (ref 70–99)
Potassium: 3.8 mEq/L (ref 3.5–5.1)
Sodium: 138 mEq/L (ref 135–145)
Total Bilirubin: 0.2 mg/dL — ABNORMAL LOW (ref 0.3–1.2)

## 2011-08-02 LAB — WET PREP, GENITAL: Trich, Wet Prep: NONE SEEN

## 2011-08-02 MED ORDER — SULFAMETHOXAZOLE-TMP DS 800-160 MG PO TABS: 1.0000 | ORAL_TABLET | Freq: Two times a day (BID) | ORAL | Status: AC

## 2011-08-02 NOTE — MAU Note (Signed)
Pt had 3 positive home pregnancy tests prior to today;

## 2011-08-02 NOTE — MAU Provider Note (Signed)
Hayley Calhoun [id number] Chief Complaint  Patient presents with  . Extremity Weakness     First Provider Initiated Contact with Patient 08/02/11 1227      SUBJECTIVE  HPI: Pt presents to MAU with report of 2 menstrual cycles in May, light spotting 3 days ago which has resolved, and dizziness with nausea x2 days.  She had a miscarriage in March and is not currently using contraception.  She had 3 faintly positive pregnancy tests at home 2 weeks ago.  She denies LOF, vaginal itching/burning, urinary symptoms, h/a, or fever/chills.    Past Medical History  Diagnosis Date  . Spina bifida   . Bladder disorder   . Kidney stone   . UTI (lower urinary tract infection)    Past Surgical History  Procedure Date  . Spina bifida   . Lithotripsy    History   Social History  . Marital Status: Single    Spouse Name: N/A    Number of Children: N/A  . Years of Education: N/A   Occupational History  . Not on file.   Social History Main Topics  . Smoking status: Never Smoker   . Smokeless tobacco: Not on file  . Alcohol Use: No  . Drug Use: No  . Sexually Active: Yes   Other Topics Concern  . Not on file   Social History Narrative  . No narrative on file   No current facility-administered medications on file prior to encounter.   No current outpatient prescriptions on file prior to encounter.   Allergies  Allergen Reactions  . Nitrofurantoin Monohyd Macro Nausea And Vomiting  . Penicillins     Childhood reaction unknown    ROS: Pertinent items in HPI  OBJECTIVE Blood pressure 126/84, pulse 78, temperature 98.1 F (36.7 C), temperature source Oral, resp. rate 16, height 5\' 4"  (1.626 m), weight 58.968 kg (130 lb), last menstrual period 07/11/2011, unknown if currently breastfeeding.  GENERAL: Well-developed, well-nourished female in no acute distress.  HEENT: Normocephalic, good dentition HEART: normal rate RESP: normal effort ABDOMEN: Soft,  nontender EXTREMITIES: Nontender, no edema NEURO: Alert and oriented SPECULUM EXAM: Pelvic exam: Cervix pink, nonfriable, visually closed, without lesion, scant white creamy discharge, vaginal walls and external genitalia normal Bimanual exam: Cervix 0/long/high, firm, posterior, neg CMT, uterus nontender, nonenlarged, adnexa without tenderness, enlargement, or mass   LAB RESULTS Results for orders placed during the hospital encounter of 08/02/11 (from the past 24 hour(s))  URINALYSIS, ROUTINE W REFLEX MICROSCOPIC     Status: Abnormal   Collection Time   08/02/11 11:30 AM      Component Value Range   Color, Urine YELLOW  YELLOW   APPearance CLOUDY (*) CLEAR   Specific Gravity, Urine >1.030 (*) 1.005 - 1.030   pH 6.0  5.0 - 8.0   Glucose, UA NEGATIVE  NEGATIVE mg/dL   Hgb urine dipstick NEGATIVE  NEGATIVE   Bilirubin Urine NEGATIVE  NEGATIVE   Ketones, ur NEGATIVE  NEGATIVE mg/dL   Protein, ur NEGATIVE  NEGATIVE mg/dL   Urobilinogen, UA 0.2  0.0 - 1.0 mg/dL   Nitrite POSITIVE (*) NEGATIVE   Leukocytes, UA TRACE (*) NEGATIVE  URINE MICROSCOPIC-ADD ON     Status: Abnormal   Collection Time   08/02/11 11:30 AM      Component Value Range   Squamous Epithelial / LPF FEW (*) RARE   WBC, UA 3-6  <3 WBC/hpf   Bacteria, UA MANY (*) RARE  POCT PREGNANCY, URINE  Status: Normal   Collection Time   08/02/11 11:37 AM      Component Value Range   Preg Test, Ur NEGATIVE  NEGATIVE  CBC     Status: Abnormal   Collection Time   08/02/11 12:35 PM      Component Value Range   WBC 4.3  4.0 - 10.5 K/uL   RBC 4.86  3.87 - 5.11 MIL/uL   Hemoglobin 11.3 (*) 12.0 - 15.0 g/dL   HCT 16.1 (*) 09.6 - 04.5 %   MCV 69.1 (*) 78.0 - 100.0 fL   MCH 23.3 (*) 26.0 - 34.0 pg   MCHC 33.6  30.0 - 36.0 g/dL   RDW 40.9 (*) 81.1 - 91.4 %   Platelets 232  150 - 400 K/uL  COMPREHENSIVE METABOLIC PANEL     Status: Abnormal   Collection Time   08/02/11 12:35 PM      Component Value Range   Sodium 138  135 - 145  mEq/L   Potassium 3.8  3.5 - 5.1 mEq/L   Chloride 101  96 - 112 mEq/L   CO2 28  19 - 32 mEq/L   Glucose, Bld 76  70 - 99 mg/dL   BUN 9  6 - 23 mg/dL   Creatinine, Ser 7.82  0.50 - 1.10 mg/dL   Calcium 9.2  8.4 - 95.6 mg/dL   Total Protein 6.5  6.0 - 8.3 g/dL   Albumin 3.6  3.5 - 5.2 g/dL   AST 20  0 - 37 U/L   ALT 14  0 - 35 U/L   Alkaline Phosphatase 38 (*) 39 - 117 U/L   Total Bilirubin 0.2 (*) 0.3 - 1.2 mg/dL   GFR calc non Af Amer >90  >90 mL/min   GFR calc Af Amer >90  >90 mL/min  HCG, QUANTITATIVE, PREGNANCY     Status: Normal   Collection Time   08/02/11 12:35 PM      Component Value Range   hCG, Beta Chain, Quant, S <1  <5 mIU/mL  WET PREP, GENITAL     Status: Abnormal   Collection Time   08/02/11 12:45 PM      Component Value Range   Yeast Wet Prep HPF POC NONE SEEN  NONE SEEN   Trich, Wet Prep NONE SEEN  NONE SEEN   Clue Cells Wet Prep HPF POC NONE SEEN  NONE SEEN   WBC, Wet Prep HPF POC FEW (*) NONE SEEN     ASSESSMENT False positive home UPT vs early miscarriage UTI  PLAN D/C home with bleeding precautions Urine sent for culture Bactrim DS BID x7 days Encourage prenatal vitamin and iron-rich foods while pt trying to conceive  Return to MAU as needed  [place]-KIRBY, Wacey Zieger 08/02/2011 12:51 PM

## 2011-08-02 NOTE — MAU Note (Signed)
C/o feeling weak; c/o 2 periods last month; hx frequency UTI; c/o clear discharge

## 2011-08-02 NOTE — Discharge Instructions (Signed)
Urinary Tract Infection Infections of the urinary tract can start in several places. A bladder infection (cystitis), a kidney infection (pyelonephritis), and a prostate infection (prostatitis) are different types of urinary tract infections (UTIs). They usually get better if treated with medicines (antibiotics) that kill germs. Take all the medicine until it is gone. You or your child may feel better in a few days, but TAKE ALL MEDICINE or the infection may not respond and may become more difficult to treat. HOME CARE INSTRUCTIONS   Drink enough water and fluids to keep the urine clear or pale yellow. Cranberry juice is especially recommended, in addition to large amounts of water.   Avoid caffeine, tea, and carbonated beverages. They tend to irritate the bladder.   Alcohol may irritate the prostate.   Only take over-the-counter or prescription medicines for pain, discomfort, or fever as directed by your caregiver.  To prevent further infections:  Empty the bladder often. Avoid holding urine for long periods of time.   After a bowel movement, women should cleanse from front to back. Use each tissue only once.   Empty the bladder before and after sexual intercourse.  FINDING OUT THE RESULTS OF YOUR TEST Not all test results are available during your visit. If your or your child's test results are not back during the visit, make an appointment with your caregiver to find out the results. Do not assume everything is normal if you have not heard from your caregiver or the medical facility. It is important for you to follow up on all test results. SEEK MEDICAL CARE IF:   There is back pain.   Your baby is older than 3 months with a rectal temperature of 100.5 F (38.1 C) or higher for more than 1 day.   Your or your child's problems (symptoms) are no better in 3 days. Return sooner if you or your child is getting worse.  SEEK IMMEDIATE MEDICAL CARE IF:   There is severe back pain or lower  abdominal pain.   You or your child develops chills.   You have a fever.   Your baby is older than 3 months with a rectal temperature of 102 F (38.9 C) or higher.   Your baby is 61 months old or younger with a rectal temperature of 100.4 F (38 C) or higher.   There is nausea or vomiting.   There is continued burning or discomfort with urination.  MAKE SURE YOU:   Understand these instructions.   Will watch your condition.   Will get help right away if you are not doing well or get worse.  Document Released: 11/08/2004 Document Revised: 01/18/2011 Document Reviewed: 06/13/2006 [facility] Patient Information 2012 [place], [place]. Anemia, Frequently Asked Questions WHAT ARE THE SYMPTOMS OF ANEMIA?  Headache.   Difficulty thinking.   Fatigue.   Shortness of breath.   Weakness.   Rapid heartbeat.  AT WHAT POINT ARE PEOPLE CONSIDERED ANEMIC?  This varies with gender and age.   Both hemoglobin (Hgb) and hematocrit values are used to define anemia. These lab values are obtained from a complete blood count (CBC) test. This is performed at a caregiver's office.   The normal range of hemoglobin values for adult men is 14.0 g/dL to 16.1 g/dL. For nonpregnant women, values are 12.3 g/dL to 09.6 g/dL.   The World Health Organization defines anemia as less than 12 g/dL for nonpregnant women and less than 13 g/dL for men.   For adult males, the average normal  hematocrit is 46%, and the range is 40% to 52%.   For adult females, the average normal hematocrit is 41%, and the range is 35% to 47%.   Values that fall below the lower limits can be a sign of anemia and should have further checking (evaluation).  GROUPS OF PEOPLE WHO ARE AT RISK FOR DEVELOPING ANEMIA INCLUDE:   Infants who are breastfed or taking a formula that is not fortified with iron.   Children going through a rapid growth spurt. The iron available can not keep up with the needs for a red cell mass which must grow  with the child.   Women in childbearing years. They need iron because of blood loss during menstruation.   Pregnant women. The growing fetus creates a high demand for iron.   People with ongoing gastrointestinal blood loss are at risk of developing iron deficiency.   Individuals with leukemia or cancer who must receive chemotherapy or radiation to treat their disease. The drugs or radiation used to treat these diseases often decreases the bone marrow's ability to make cells of all classes. This includes red blood cells, white blood cells, and platelets.   Individuals with chronic inflammatory conditions such as rheumatoid arthritis or chronic infections.   The elderly.  ARE SOME TYPES OF ANEMIA INHERITED?   Yes, some types of anemia are due to inherited or genetic defects.   Sickle cell anemia. This occurs most often in people of African, African American, and Mediterranean descent.   Thalassemia (or Cooley's anemia). This type is found in people of Mediterranean and Southeast Asian descent. These types of anemia are common.   [place]. This is rare.  CAN CERTAIN MEDICATIONS CAUSE A PERSON TO BECOME ANEMIC?  Yes. For example, drugs to fight cancer (chemotherapeutic agents) often cause anemia. These drugs can slow the bone marrow's ability to make red blood cells. If there are not enough red blood cells, the body does not get enough oxygen. WHAT HEMATOCRIT LEVEL IS REQUIRED TO DONATE BLOOD?  The lower limit of an acceptable hematocrit for blood donors is 38%. If you have a low hematocrit value, you should schedule an appointment with your caregiver. ARE BLOOD TRANSFUSIONS COMMONLY USED TO CORRECT ANEMIA, AND ARE THEY DANGEROUS?  They are used to treat anemia as a last resort. Your caregiver will find the cause of the anemia and correct it if possible. Most blood transfusions are given because of excessive bleeding at the time of surgery, with trauma, or because of bone marrow suppression in  patients with cancer or leukemia on chemotherapy. Blood transfusions are safer than ever before. We also know that blood transfusions affect the immune system and may increase certain risks. There is also a concern for human error. In 1/16,000 transfusions, a patient receives a transfusion of blood that is not matched with his or her blood type.  WHAT IS IRON DEFICIENCY ANEMIA AND CAN I CORRECT IT BY CHANGING MY DIET?  Iron is an essential part of hemoglobin. Without enough hemoglobin, anemia develops and the body does not get the right amount of oxygen. Iron deficiency anemia develops after the body has had a low level of iron for a long time. This is either caused by blood loss, not taking in or absorbing enough iron, or increased demands for iron (like pregnancy or rapid growth).  Foods from animal origin such as beef, chicken, and pork, are good sources of iron. Be sure to have one of these foods at each meal. Vitamin  C helps your body absorb iron. Foods rich in Vitamin C include citrus, bell pepper, strawberries, spinach and cantaloupe. In some cases, iron supplements may be needed in order to correct the iron deficiency. In the case of poor absorption, extra iron may have to be given directly into the vein through a needle (intravenously). I HAVE BEEN DIAGNOSED WITH IRON DEFICIENCY ANEMIA AND MY CAREGIVER PRESCRIBED IRON SUPPLEMENTS. HOW LONG WILL IT TAKE FOR MY BLOOD TO BECOME NORMAL?  It depends on the degree of anemia at the beginning of treatment. Most people with mild to moderate iron deficiency, anemia will correct the anemia over a period of 2 to 3 months. But after the anemia is corrected, the iron stored by the body is still low. Caregivers often suggest an additional 6 months of oral iron therapy once the anemia has been reversed. This will help prevent the iron deficiency anemia from quickly happening again. Non-anemic adult males should take iron supplements only under the direction of a  doctor, too much iron can cause liver damage.  MY HEMOGLOBIN IS 9 G/DL AND I AM SCHEDULED FOR SURGERY. SHOULD I POSTPONE THE SURGERY?  If you have Hgb of 9, you should discuss this with your caregiver right away. Many patients with similar hemoglobin levels have had surgery without problems. If minimal blood loss is expected for a minor procedure, no treatment may be necessary.  If a greater blood loss is expected for more extensive procedures, you should ask your caregiver about being treated with erythropoietin and iron. This is to accelerate the recovery of your hemoglobin to a normal level before surgery. An anemic patient who undergoes high-blood-loss surgery has a greater risk of surgical complications and need for a blood transfusion, which also carries some risk.  I HAVE BEEN TOLD THAT HEAVY MENSTRUAL PERIODS CAUSE ANEMIA. IS THERE ANYTHING I CAN DO TO PREVENT THE ANEMIA?  Anemia that results from heavy periods is usually due to iron deficiency. You can try to meet the increased demands for iron caused by the heavy monthly blood loss by increasing the intake of iron-rich foods. Iron supplements may be required. Discuss your concerns with your caregiver. WHAT CAUSES ANEMIA DURING PREGNANCY?  Pregnancy places major demands on the body. The mother must meet the needs of both her body and her growing baby. The body needs enough iron and folate to make the right amount of red blood cells. To prevent anemia while pregnant, the mother should stay in close contact with her caregiver.  Be sure to eat a diet that has foods rich in iron and folate like liver and dark green leafy vegetables. Folate plays an important role in the normal development of a baby's spinal cord. Folate can help prevent serious disorders like spina bifida. If your diet does not provide adequate nutrients, you may want to talk with your caregiver about nutritional supplements.  WHAT IS THE RELATIONSHIP BETWEEN FIBROID TUMORS AND ANEMIA  IN WOMEN?  The relationship is usually caused by the increased menstrual blood loss caused by fibroids. Good iron intake may be required to prevent iron deficiency anemia from developing.  Document Released: 09/07/2003 Document Revised: 01/18/2011 Document Reviewed: 02/21/2010 [facility] Patient Information 2012 [place], [place].

## 2011-08-03 LAB — GC/CHLAMYDIA PROBE AMP, GENITAL
Chlamydia, DNA Probe: NEGATIVE
GC Probe Amp, Genital: NEGATIVE

## 2011-08-04 LAB — URINE CULTURE: Culture  Setup Time: 201306210125

## 2011-08-06 ENCOUNTER — Other Ambulatory Visit: Payer: Self-pay | Admitting: Physician Assistant

## 2011-09-03 IMAGING — US US OB FOLLOW-UP
1 series · 14 of 28 positions shown · non-contrast
Comparison: none

OBSTETRICAL ULTRASOUND:
 This ultrasound exam was performed in the [HOSPITAL] Ultrasound Department.  The OB US report was generated in the AS system, and faxed to the ordering physician.  This report is also available in [HOSPITAL]?s Access[employer]ware and in [REDACTED] PACS.

[Series 1: us ob follow up · 0.26mm/px · 14 of 29 slices shown]
[im 2/29]
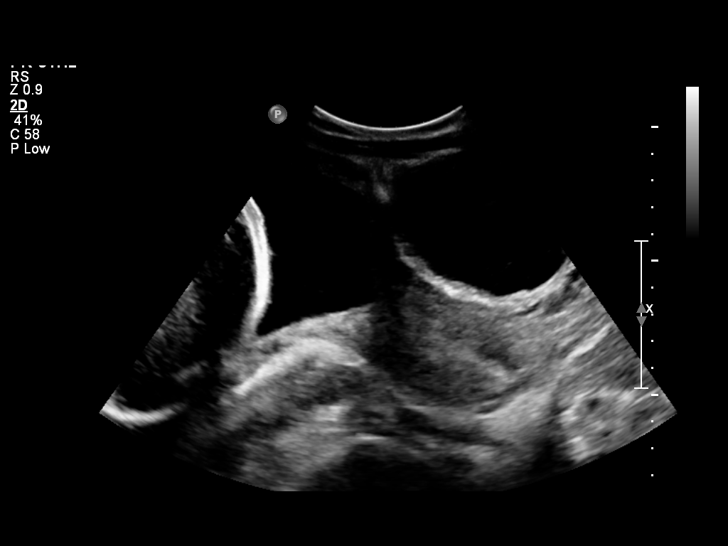
[im 4/29]
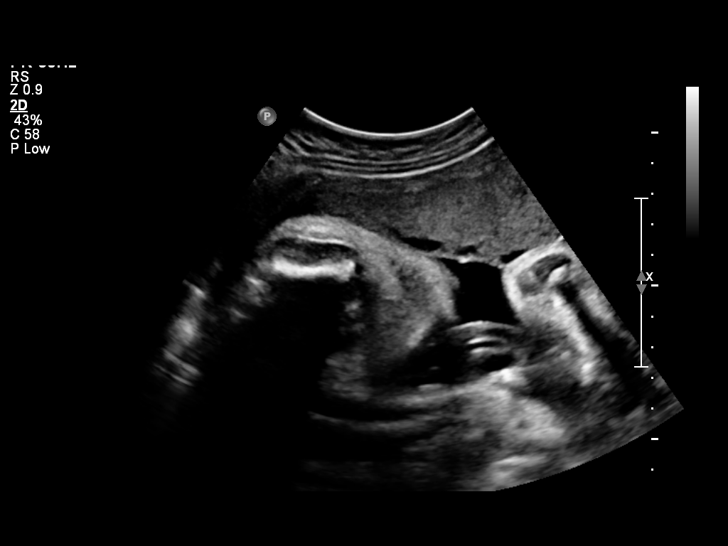
[im 6/29]
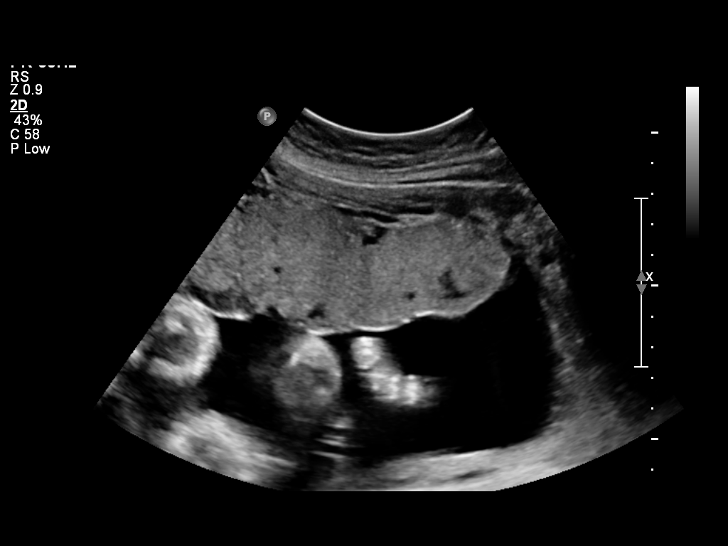
[im 8/29]
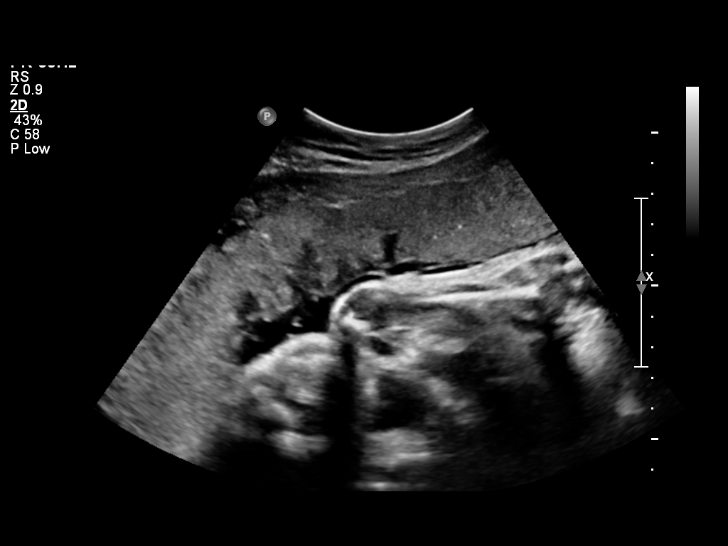
[im 10/29]
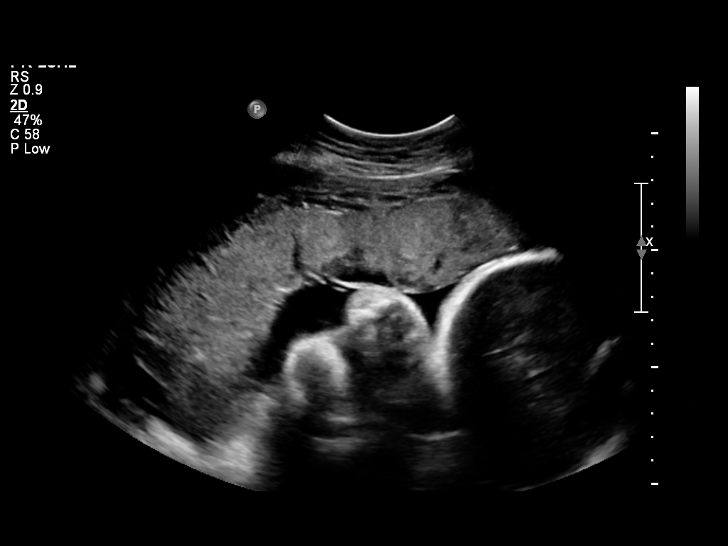
[im 12/29]
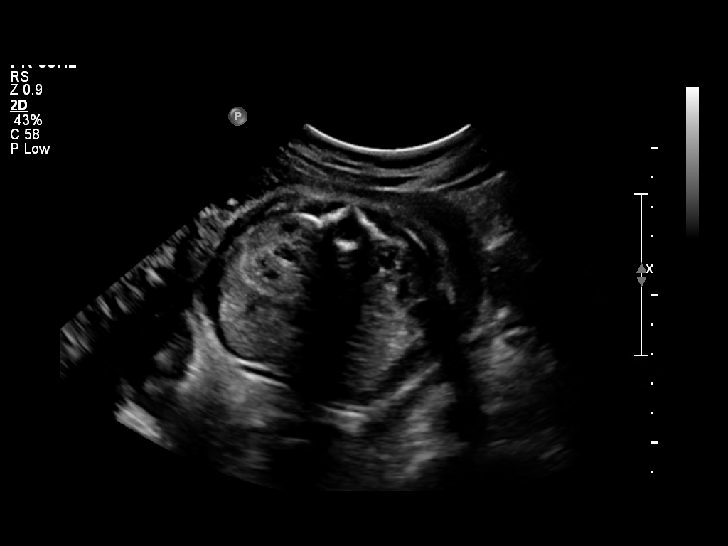
[im 14/29]
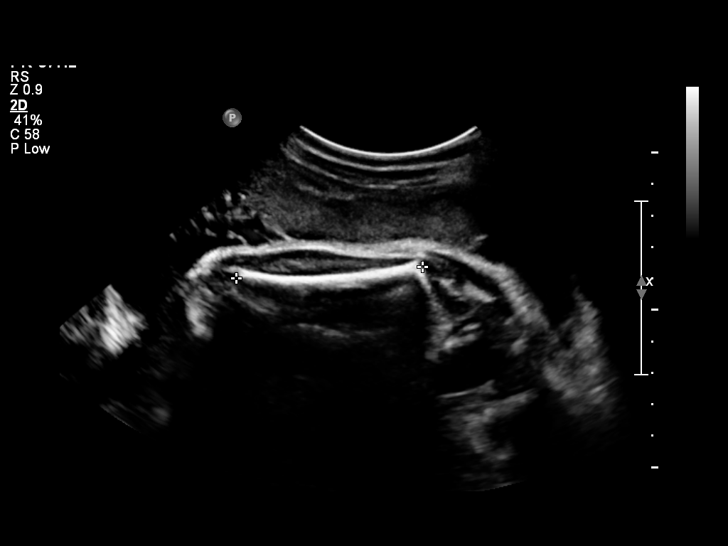
[im 16/29]
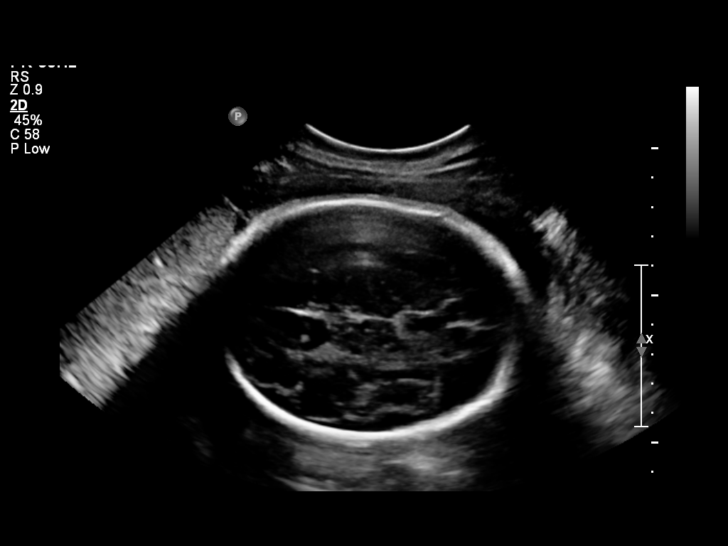
[im 18/29]
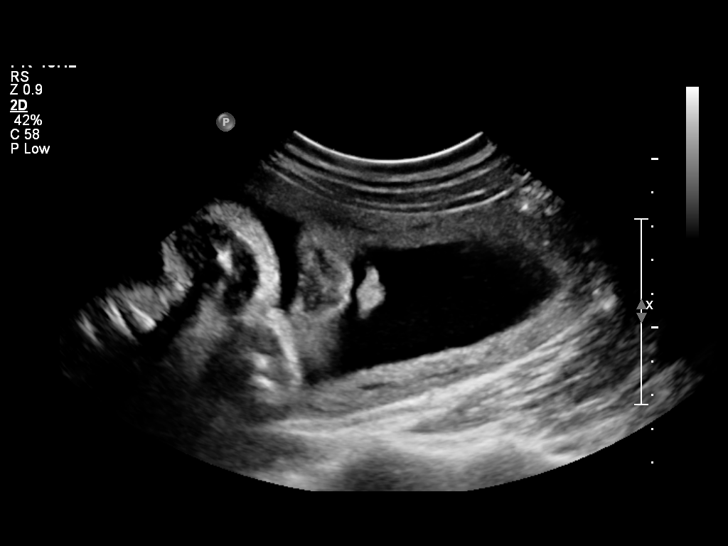
[im 20/29]
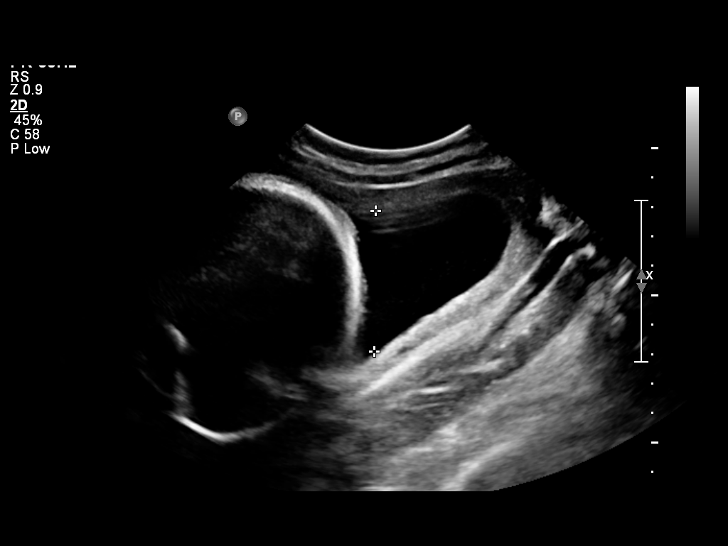
[im 22/29]
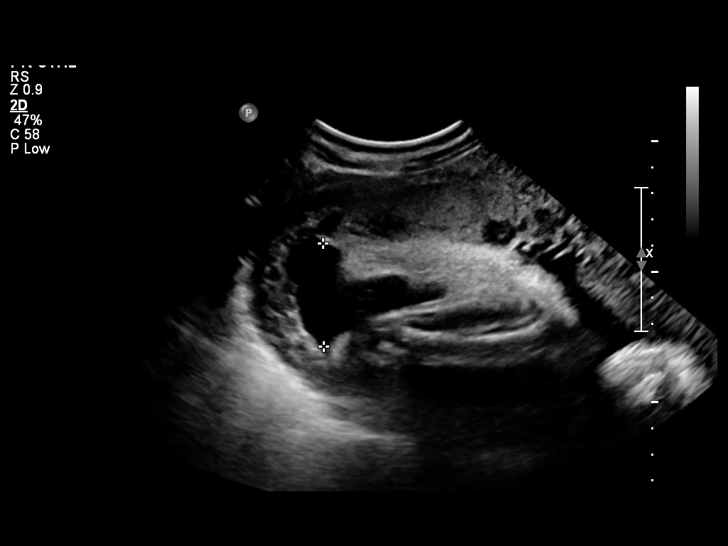
[im 24/29]
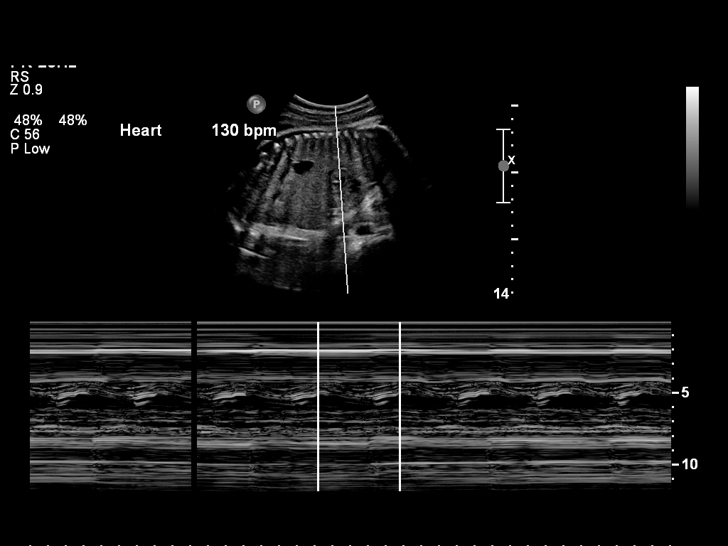
[im 26/29]
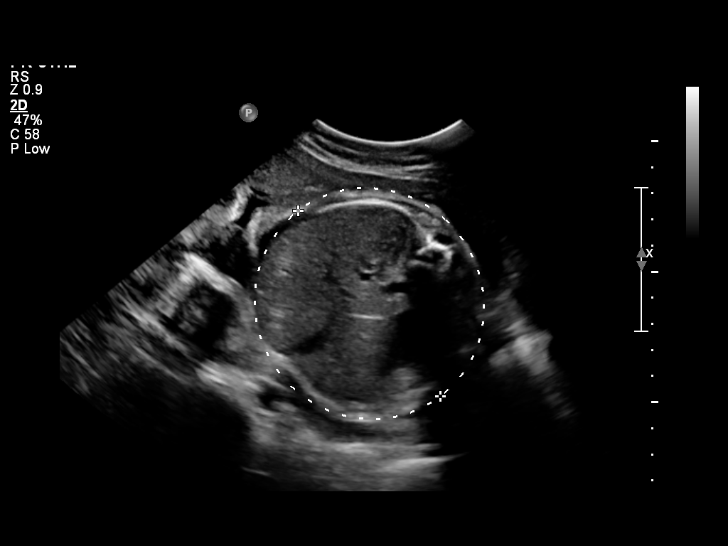
[im 29/29]
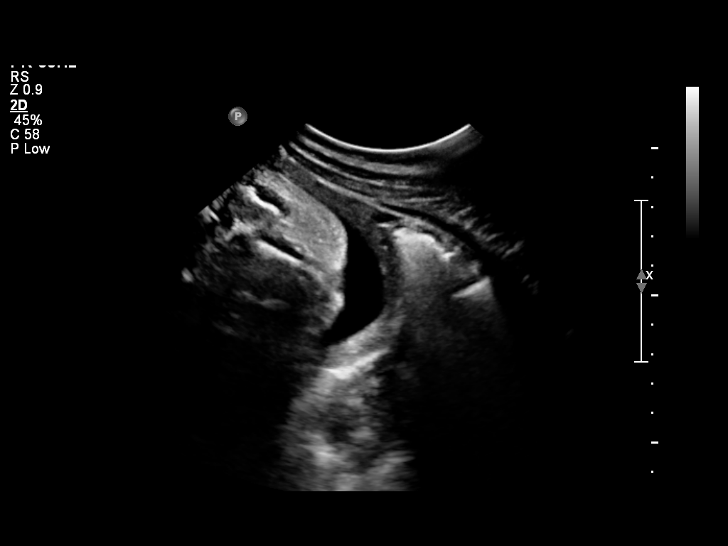

[14 of 28 positions shown; findings below may reference images not displayed]

IMPRESSION: See [facility] Obstetric US report.

## 2011-09-25 ENCOUNTER — Encounter (HOSPITAL_COMMUNITY): Payer: Self-pay | Admitting: Advanced Practice Midwife

## 2011-09-25 ENCOUNTER — Inpatient Hospital Stay (HOSPITAL_COMMUNITY)
Admission: AD | Admit: 2011-09-25 | Discharge: 2011-09-25 | Disposition: A | Discharge status: Home / Self Care | Payer: Medicaid Other | Source: Ambulatory Visit | Attending: Obstetrics | Admitting: Obstetrics

## 2011-09-25 ENCOUNTER — Inpatient Hospital Stay (HOSPITAL_COMMUNITY): Payer: Medicaid Other

## 2011-09-25 DIAGNOSIS — O021 Missed abortion: Secondary | ICD-10-CM | POA: Insufficient documentation

## 2011-09-25 DIAGNOSIS — O039 Complete or unspecified spontaneous abortion without complication: Secondary | ICD-10-CM

## 2011-09-25 HISTORY — DX: Calculus of kidney: N20.0

## 2011-09-25 NOTE — MAU Provider Note (Signed)
  History     CSN: [id number]  Arrival date and time: 09/25/11 0808   None     No chief complaint on file.  HPI 26 y.o. [id number] at [redacted]w[redacted]d with cramping, no bleeding. Seen in office yesterday, no FHT by doppler. Concerned that she is having a miscarriage, h/o SAB x 4.   Past Medical History  Diagnosis Date  . Spina bifida   . Bladder disorder   . Kidney stone   . UTI (lower urinary tract infection)     Past Surgical History  Procedure Date  . Spina bifida   . Lithotripsy     Family History  Problem Relation Age of Onset  . Other Neg Hx     History  Substance Use Topics  . Smoking status: Never Smoker   . Smokeless tobacco: Not on file  . Alcohol Use: No    Allergies:  Allergies  Allergen Reactions  . Nitrofurantoin Monohyd Macro Nausea And Vomiting  . Penicillins     Childhood reaction unknown    Prescriptions prior to admission  Medication Sig Dispense Refill  . Multiple Vitamin (MULTIVITAMIN WITH MINERALS) TABS Take 1 tablet by mouth at bedtime.        Review of Systems  Constitutional: Negative.   Respiratory: Negative.   Cardiovascular: Negative.   Gastrointestinal: Negative for nausea, vomiting, abdominal pain, diarrhea and constipation.  Genitourinary: Negative for dysuria, urgency, frequency, hematuria and flank pain.       Negative for vaginal bleeding, vaginal discharge  Musculoskeletal: Negative.   Neurological: Negative.   Psychiatric/Behavioral: Negative.    Physical Exam   Blood pressure 122/75, pulse 70, resp. rate 18, height 5\' 4"  (1.626 m), weight 128 lb (58.06 kg), SpO2 100.00%, unknown if currently breastfeeding.  Physical Exam  Nursing note and vitals reviewed. Constitutional: She is oriented to person, place, and time. She appears well-developed and well-nourished. No distress.  Cardiovascular: Normal rate.   Respiratory: Effort normal.  GI: Soft. There is no tenderness.  Musculoskeletal: Normal range of motion.    Neurological: She is alert and oriented to person, place, and time.  Skin: Skin is warm and dry.  Psychiatric: She has a normal mood and affect.    MAU Course  Procedures  Bedside u/s - no FHT visualized      Assessment and Plan  25 y.o. [id number]  9 week demise Spoke with Dr. Clearance Coots - f/u in office on Thursday at 1:45 with Dr. Tamela Oddi Rev'd precautions Motrin PRN pain  Cleburn Maiolo 09/25/2011, 8:54 AM

## 2011-09-25 NOTE — MAU Note (Signed)
Patient states she has been having abdominal cramping for about one week. Was in the office yesterday and they were unable to hear a heart beat with a doppler. No bleeding

## 2011-09-29 ENCOUNTER — Encounter (HOSPITAL_COMMUNITY): Payer: Self-pay | Admitting: Obstetrics and Gynecology

## 2011-09-29 ENCOUNTER — Inpatient Hospital Stay (HOSPITAL_COMMUNITY)
Admission: AD | Admit: 2011-09-29 | Discharge: 2011-09-29 | Disposition: A | Discharge status: Home / Self Care | Payer: Medicaid Other | Source: Ambulatory Visit | Attending: Obstetrics & Gynecology | Admitting: Obstetrics & Gynecology

## 2011-09-29 DIAGNOSIS — R112 Nausea with vomiting, unspecified: Secondary | ICD-10-CM | POA: Insufficient documentation

## 2011-09-29 DIAGNOSIS — O034 Incomplete spontaneous abortion without complication: Secondary | ICD-10-CM | POA: Insufficient documentation

## 2011-09-29 LAB — CBC
HCT: 34.3 % — ABNORMAL LOW (ref 36.0–46.0)
MCH: 24.7 pg — ABNORMAL LOW (ref 26.0–34.0)
MCV: 71.3 fL — ABNORMAL LOW (ref 78.0–100.0)
Platelets: 221 10*3/uL (ref 150–400)
RDW: 18.2 % — ABNORMAL HIGH (ref 11.5–15.5)
WBC: 7.4 10*3/uL (ref 4.0–10.5)

## 2011-09-29 MED ORDER — OXYCODONE-ACETAMINOPHEN 5-325 MG PO TABS
1.0000 | ORAL_TABLET | Freq: Once | ORAL | Status: AC
  Administered 2011-09-29: 1 via ORAL
  Filled 2011-09-29: qty 1

## 2011-09-29 MED ORDER — MISOPROSTOL 200 MCG PO TABS
800.0000 ug | ORAL_TABLET | Freq: Once | ORAL | Status: AC
  Administered 2011-09-29: 800 ug via VAGINAL
  Filled 2011-09-29: qty 4

## 2011-09-29 MED ORDER — IBUPROFEN 800 MG PO TABS: 800.0000 mg | ORAL_TABLET | Freq: Three times a day (TID) | ORAL | Status: AC | PRN

## 2011-09-29 MED ORDER — IBUPROFEN 800 MG PO TABS: 800.0000 mg | ORAL_TABLET | Freq: Three times a day (TID) | ORAL | Status: DC | PRN

## 2011-09-29 MED ORDER — ONDANSETRON 8 MG PO TBDP: 8.0000 mg | ORAL_TABLET | Freq: Three times a day (TID) | ORAL | Status: DC | PRN

## 2011-09-29 MED ORDER — ONDANSETRON 8 MG PO TBDP
8.0000 mg | ORAL_TABLET | Freq: Once | ORAL | Status: AC
  Administered 2011-09-29: 8 mg via ORAL
  Filled 2011-09-29: qty 1

## 2011-09-29 MED ORDER — ONDANSETRON 8 MG PO TBDP: 8.0000 mg | ORAL_TABLET | Freq: Three times a day (TID) | ORAL | Status: AC | PRN

## 2011-09-29 MED ORDER — OXYCODONE-ACETAMINOPHEN 5-500 MG PO TABS: 1.0000 | ORAL_TABLET | ORAL | Status: AC | PRN

## 2011-09-29 NOTE — MAU Note (Signed)
Patient presents with N/V, weakness and pain after being told her baby stopped growing at 9 weeks has been unable to miscarry on her.

## 2011-09-29 NOTE — MAU Provider Note (Signed)
History     CSN: [id number]  Arrival date and time: 09/29/11 1652   First Provider Initiated Contact with Patient 09/29/11 1729      Chief Complaint  Patient presents with  . Nausea  . Fatigue  . Abdominal Pain   HPI 26 y.o. [id number] at [redacted]w[redacted]d by LMP with incomplete SAB at [redacted] weeks EGA, diagnosed on Tuesday of this week. Saw Dr. Clearance Coots in office on Thursday, plan was for expectant mgmt over the weekend with follow up on Monday if SAB had not progressed. Pt states no bleeding at this time, has some left pelvic pain and nausea/vomiting today.     Past Medical History  Diagnosis Date  . Spina bifida   . Bladder disorder   . Kidney stone   . UTI (lower urinary tract infection)   . Kidney stones     Past Surgical History  Procedure Date  . Spina bifida   . Lithotripsy     Family History  Problem Relation Age of Onset  . Other Neg Hx   . Scoliosis Mother     History  Substance Use Topics  . Smoking status: Never Smoker   . Smokeless tobacco: Not on file  . Alcohol Use: No    Allergies:  Allergies  Allergen Reactions  . Nitrofurantoin Monohyd Macro Nausea And Vomiting  . Penicillins     Childhood reaction unknown    Prescriptions prior to admission  Medication Sig Dispense Refill  . calcium carbonate (TUMS - DOSED IN MG ELEMENTAL CALCIUM) 500 MG chewable tablet Chew 2 tablets by mouth daily as needed. For heartburn      . glycerin adult (GLYCERIN ADULT) 2 G SUPP Place 1 suppository rectally once. For constipation      . Multiple Vitamin (MULTIVITAMIN WITH MINERALS) TABS Take 1 tablet by mouth at bedtime.      . ondansetron (ZOFRAN-ODT) 8 MG disintegrating tablet Take 8 mg by mouth every 12 (twelve) hours as needed. For nausea        Review of Systems  Constitutional: Negative.   Respiratory: Negative.   Cardiovascular: Negative.   Gastrointestinal: Positive for nausea, vomiting and abdominal pain. Negative for diarrhea and constipation.  Genitourinary:  Negative for dysuria, urgency, frequency, hematuria and flank pain.       Negative for vaginal bleeding, vaginal discharge  Musculoskeletal: Negative.   Neurological: Negative.   Psychiatric/Behavioral: Negative.    Physical Exam   Blood pressure 134/79, pulse 87, temperature 98.6 F (37 C), temperature source Oral, resp. rate 18, height 5\' 4"  (1.626 m), weight 130 lb 9.6 oz (59.24 kg), last menstrual period 07/12/2011.  Physical Exam  Nursing note and vitals reviewed. Constitutional: She is oriented to person, place, and time. She appears well-developed and well-nourished. No distress.  Cardiovascular: Normal rate.   Respiratory: Effort normal.  GI: Soft. Bowel sounds are normal. She exhibits no distension and no mass. There is no tenderness. There is no rebound and no guarding.  Neurological: She is alert and oriented to person, place, and time.  Skin: Skin is warm and dry.  Psychiatric: She has a normal mood and affect.    MAU Course  Procedures  Results for orders placed during the hospital encounter of 09/29/11 (from the past 72 hour(s))  CBC     Status: Abnormal   Collection Time   09/29/11  7:52 PM      Component Value Range Comment   WBC 7.4  4.0 - 10.5 K/uL  RBC 4.81  3.87 - 5.11 MIL/uL    Hemoglobin 11.9 (*) 12.0 - 15.0 g/dL    HCT 40.9 (*) 81.1 - 46.0 %    MCV 71.3 (*) 78.0 - 100.0 fL    MCH 24.7 (*) 26.0 - 34.0 pg    MCHC 34.7  30.0 - 36.0 g/dL    RDW 91.4 (*) 78.2 - 15.5 %    Platelets 221  150 - 400 K/uL       Assessment and Plan  26 y.o. [id number] with 9 wk missed AB Cytotec 800 mcg per vagina given today in MAU per patient request Follow up next week in office Precautions rev'd  Medication List  As of 10/02/2011 11:15 AM   TAKE these medications         calcium carbonate 500 MG chewable tablet   Commonly known as: TUMS - dosed in mg elemental calcium   Chew 2 tablets by mouth daily as needed. For heartburn      glycerin adult 2 G Supp   Generic  drug: glycerin adult   Place 1 suppository rectally once. For constipation      ibuprofen 800 MG tablet   Commonly known as: ADVIL,MOTRIN   Take 1 tablet (800 mg total) by mouth every 8 (eight) hours as needed for pain.      multivitamin with minerals Tabs   Take 1 tablet by mouth at bedtime.      ondansetron 8 MG disintegrating tablet   Commonly known as: ZOFRAN-ODT   Take 1 tablet (8 mg total) by mouth every 8 (eight) hours as needed for nausea.      ondansetron 8 MG disintegrating tablet   Commonly known as: ZOFRAN-ODT   Take 8 mg by mouth every 12 (twelve) hours as needed. For nausea      oxycodone-acetaminophen 5-500 MG per tablet   Commonly known as: ROXICET   Take 1 tablet by mouth every 4 (four) hours as needed for pain.             FRAZIER,NATALIE 09/29/2011, 5:30 PM

## 2011-10-02 ENCOUNTER — Encounter (HOSPITAL_COMMUNITY): Payer: Self-pay | Admitting: Advanced Practice Midwife

## 2011-11-25 DIAGNOSIS — R109 Unspecified abdominal pain: Secondary | ICD-10-CM | POA: Insufficient documentation

## 2011-11-25 DIAGNOSIS — R0602 Shortness of breath: Secondary | ICD-10-CM | POA: Insufficient documentation

## 2011-11-25 DIAGNOSIS — R110 Nausea: Secondary | ICD-10-CM | POA: Insufficient documentation

## 2011-11-25 DIAGNOSIS — N390 Urinary tract infection, site not specified: Secondary | ICD-10-CM | POA: Insufficient documentation

## 2011-11-26 ENCOUNTER — Emergency Department (HOSPITAL_COMMUNITY)
Admission: EM | Admit: 2011-11-26 | Discharge: 2011-11-26 | Disposition: A | Discharge status: Home / Self Care | Payer: Medicaid Other | Attending: Emergency Medicine | Admitting: Emergency Medicine

## 2011-11-26 ENCOUNTER — Emergency Department (HOSPITAL_COMMUNITY): Payer: Medicaid Other

## 2011-11-26 ENCOUNTER — Encounter (HOSPITAL_COMMUNITY): Payer: Self-pay | Admitting: *Deleted

## 2011-11-26 DIAGNOSIS — R110 Nausea: Secondary | ICD-10-CM

## 2011-11-26 DIAGNOSIS — N390 Urinary tract infection, site not specified: Secondary | ICD-10-CM

## 2011-11-26 LAB — URINE MICROSCOPIC-ADD ON

## 2011-11-26 LAB — URINALYSIS, ROUTINE W REFLEX MICROSCOPIC
Glucose, UA: NEGATIVE mg/dL
Ketones, ur: NEGATIVE mg/dL
pH: 7.5 (ref 5.0–8.0)

## 2011-11-26 LAB — PREGNANCY, URINE: Preg Test, Ur: NEGATIVE

## 2011-11-26 LAB — WET PREP, GENITAL: Trich, Wet Prep: NONE SEEN

## 2011-11-26 MED ORDER — ONDANSETRON HCL 4 MG PO TABS: 4.0000 mg | ORAL_TABLET | Freq: Four times a day (QID) | ORAL | Status: DC

## 2011-11-26 MED ORDER — ONDANSETRON HCL 4 MG/2ML IJ SOLN
4.0000 mg | Freq: Once | INTRAMUSCULAR | Status: AC
  Administered 2011-11-26: 4 mg via INTRAVENOUS
  Filled 2011-11-26: qty 2

## 2011-11-26 MED ORDER — DEXTROSE 5 % IV SOLN
1.0000 g | Freq: Once | INTRAVENOUS | Status: AC
  Administered 2011-11-26: 1 g via INTRAVENOUS
  Filled 2011-11-26: qty 10

## 2011-11-26 MED ORDER — CEFPODOXIME PROXETIL 200 MG PO TABS: 200.0000 mg | ORAL_TABLET | Freq: Two times a day (BID) | ORAL | Status: DC

## 2011-11-26 MED ORDER — OXYCODONE-ACETAMINOPHEN 5-325 MG PO TABS
1.0000 | ORAL_TABLET | Freq: Once | ORAL | Status: AC
  Administered 2011-11-26: 1 via ORAL
  Filled 2011-11-26: qty 1

## 2011-11-26 MED ORDER — HYDROMORPHONE HCL PF 1 MG/ML IJ SOLN
1.0000 mg | Freq: Once | INTRAMUSCULAR | Status: AC
  Administered 2011-11-26: 1 mg via INTRAVENOUS
  Filled 2011-11-26: qty 1

## 2011-11-26 NOTE — ED Notes (Signed)
Pt transported to Ultrasound.  

## 2011-11-26 NOTE — ED Provider Notes (Signed)
History     CSN: [id number]  Arrival date & time 11/25/11  2345   First MD Initiated Contact with Patient 11/26/11 0008      Chief Complaint  Patient presents with  . Shortness of Breath  . Dizziness  . Nausea  . Urinary Tract Infection    (Consider location/radiation/quality/duration/timing/severity/associated sxs/prior treatment) HPI Comments: 26 y/o female presents to the ED with sudden onset shortness of breath waking her up from sleep tonight. States she is prone to UTI's, and her urologist has her on prophylactic Septra to take when she feels a UTI coming on or after intercourse. Admits to increased frequency which is her usual UTI symptom, so she took a Septra around 8:00 tonight before going to bed. When she woke up short of breath, she felt her stomach and chest "itching on the inside and burning". She had a mirena placed about 2 months ago, and over the past 2 weeks has been having vaginal bleeding. At first it was heavy bleeding, now it is spotting. Denies fever, chills, back pain, flank pain, dysuria, hematuria, vomiting. Had spina bifida surgery 1 year ago which is thought to be a contributing factor to her UTI's.   The history is provided by the patient.    Past Medical History  Diagnosis Date  . Spina bifida   . Bladder disorder   . Kidney stone   . UTI (lower urinary tract infection)   . Kidney stones     Past Surgical History  Procedure Date  . Spina bifida   . Lithotripsy     Family History  Problem Relation Age of Onset  . Other Neg Hx   . Scoliosis Mother     History  Substance Use Topics  . Smoking status: Never Smoker   . Smokeless tobacco: Not on file  . Alcohol Use: No    OB History    Grav Para Term Preterm Abortions TAB SAB Ect Mult Living   7 2 2  4  4   2       Review of Systems  Constitutional: Negative for fever and chills.  Eyes: Negative for visual disturbance.  Respiratory: Positive for shortness of breath.     Cardiovascular: Positive for chest pain and palpitations.  Gastrointestinal: Positive for nausea and abdominal pain. Negative for vomiting.  Genitourinary: Positive for frequency, vaginal bleeding and pelvic pain. Negative for dysuria, urgency, hematuria, flank pain, difficulty urinating and vaginal pain.  Musculoskeletal: Negative for back pain.  Skin: Negative for color change and rash.  Neurological: Negative for dizziness, syncope and light-headedness.  Psychiatric/Behavioral: Negative for confusion.    Allergies  Nitrofurantoin monohyd macro and Penicillins  Home Medications   Current Outpatient Rx  Name Route Sig Dispense Refill  . CALCIUM CARBONATE ANTACID 500 MG PO CHEW Oral Chew 2 tablets by mouth daily as needed. For heartburn    . GLYCERIN (LAXATIVE) 2 G RE SUPP Rectal Place 1 suppository rectally once. For constipation    . ADULT MULTIVITAMIN W/MINERALS CH Oral Take 1 tablet by mouth at bedtime.    Marland Kitchen ONDANSETRON 8 MG PO TBDP Oral Take 8 mg by mouth every 12 (twelve) hours as needed. For nausea      BP 141/99  Pulse 105  Temp 97.9 F (36.6 C) (Oral)  Resp 20  SpO2 100%  LMP 11/12/2011  Breastfeeding? Unknown  Physical Exam  Constitutional: She is oriented to person, place, and time. She appears well-developed and well-nourished. She appears distressed.  HENT:  Head: Normocephalic and atraumatic.  Mouth/Throat: Oropharynx is clear and moist and mucous membranes are normal.  Eyes: Conjunctivae normal and EOM are normal. Pupils are equal, round, and reactive to light.  Neck: Normal range of motion. Neck supple.  Cardiovascular: Regular rhythm and normal heart sounds.  Tachycardia present.   Pulmonary/Chest: Effort normal and breath sounds normal. She has no decreased breath sounds. She has no wheezes.  Abdominal: Soft. Normal appearance and bowel sounds are normal. There is tenderness in the right lower quadrant, suprapubic area and left lower quadrant. There is  guarding. There is no rigidity, no rebound and no CVA tenderness.  Genitourinary: Uterus normal. Cervix exhibits discharge (clear, bloody). Cervix exhibits no motion tenderness. Right adnexum displays tenderness. Right adnexum displays no mass and no fullness. Left adnexum displays tenderness. Left adnexum displays no mass and no fullness. There is bleeding around the vagina. No erythema or tenderness around the vagina. Vaginal discharge (clear mixed with blood streaks) found.  Musculoskeletal: Normal range of motion.  Neurological: She is alert and oriented to person, place, and time.  Skin: Skin is warm, dry and intact. She is not diaphoretic. No pallor.  Psychiatric: Her speech is normal and behavior is normal. Her mood appears anxious.    ED Course  Procedures (including critical care time)   Labs Reviewed  GC/CHLAMYDIA PROBE AMP, GENITAL  URINALYSIS, ROUTINE W REFLEX MICROSCOPIC  URINE CULTURE  WET PREP, GENITAL   Results for orders placed during the hospital encounter of 11/26/11  URINALYSIS, ROUTINE W REFLEX MICROSCOPIC      Component Value Range   Color, Urine YELLOW  YELLOW   APPearance TURBID (*) CLEAR   Specific Gravity, Urine 1.009  1.005 - 1.030   pH 7.5  5.0 - 8.0   Glucose, UA NEGATIVE  NEGATIVE mg/dL   Hgb urine dipstick LARGE (*) NEGATIVE   Bilirubin Urine NEGATIVE  NEGATIVE   Ketones, ur NEGATIVE  NEGATIVE mg/dL   Protein, ur NEGATIVE  NEGATIVE mg/dL   Urobilinogen, UA 0.2  0.0 - 1.0 mg/dL   Nitrite POSITIVE (*) NEGATIVE   Leukocytes, UA LARGE (*) NEGATIVE  WET PREP, GENITAL      Component Value Range   Yeast Wet Prep HPF POC NONE SEEN  NONE SEEN   Trich, Wet Prep NONE SEEN  NONE SEEN   Clue Cells Wet Prep HPF POC NONE SEEN  NONE SEEN   WBC, Wet Prep HPF POC FEW (*) NONE SEEN  URINE MICROSCOPIC-ADD ON      Component Value Range   Squamous Epithelial / LPF MANY (*) RARE   WBC, UA TOO NUMEROUS TO COUNT  <3 WBC/hpf   RBC / HPF 21-50  <3 RBC/hpf   Bacteria,  UA MANY (*) RARE   Urine-Other SOME WBC'S IN CLUMPS    PREGNANCY, URINE      Component Value Range   Preg Test, Ur NEGATIVE  NEGATIVE    US Transvaginal Non-ob  11/26/2011  *RADIOLOGY REPORT*  Clinical Data: Abdominal and pelvic pain.  Recent IUD placement.  TRANSABDOMINAL AND TRANSVAGINAL ULTRASOUND OF PELVIS Technique:  Both transabdominal and transvaginal ultrasound examinations of the pelvis were performed. Transabdominal technique was performed for global imaging of the pelvis including uterus, ovaries, adnexal regions, and pelvic cul-de-sac.  It was necessary to proceed with endovaginal exam following the transabdominal exam to visualize the endometrium.  Comparison:  None  Findings:  Uterus: The uterus is mildly anteverted and measures 9.4 x 5.6 x 6.8 cm.  No  focal myometrial mass lesions.  Endometrium: Echogenic structure with shadowing in the endometrium consistent with intrauterine device.  IUD appears to be situated in the lower half of the uterine fundus extending into the lower uterine segment.  No abnormal endometrial fluid collections. Endometrial stripe thickness is measured at about 4 mm.  Right ovary:  Right ovary measures 4.9 x 4.1 x 7 cm.  The ovary is best seen transabdominally.  No abnormal adnexal masses.  Left ovary: The left ovary measures 4.8 x 1.5 x 2.5 cm.  The ovary is best seen transabdominally.  No abnormal adnexal masses.  Other findings: Minimal free fluid is demonstrated in the cul-de- sac.  Note that the patient is unable to empty the bladder completely in the bladder is shown in the distended state.  Suggestion of internal echoes within the bladder may represent infection or artifact.  IMPRESSION: Intrauterine device is present in the endometrium, located in the lower half of the fundic region with apparent extension into the lower uterine segment.  Small amount of free fluid in the pelvis. No abnormal adnexal masses.   Original Report Authenticated By: Marlon Pel, M.D.    US Pelvis Complete  11/26/2011  *RADIOLOGY REPORT*  Clinical Data: Abdominal and pelvic pain.  Recent IUD placement.  TRANSABDOMINAL AND TRANSVAGINAL ULTRASOUND OF PELVIS Technique:  Both transabdominal and transvaginal ultrasound examinations of the pelvis were performed. Transabdominal technique was performed for global imaging of the pelvis including uterus, ovaries, adnexal regions, and pelvic cul-de-sac.  It was necessary to proceed with endovaginal exam following the transabdominal exam to visualize the endometrium.  Comparison:  None  Findings:  Uterus: The uterus is mildly anteverted and measures 9.4 x 5.6 x 6.8 cm.  No focal myometrial mass lesions.  Endometrium: Echogenic structure with shadowing in the endometrium consistent with intrauterine device.  IUD appears to be situated in the lower half of the uterine fundus extending into the lower uterine segment.  No abnormal endometrial fluid collections. Endometrial stripe thickness is measured at about 4 mm.  Right ovary:  Right ovary measures 4.9 x 4.1 x 7 cm.  The ovary is best seen transabdominally.  No abnormal adnexal masses.  Left ovary: The left ovary measures 4.8 x 1.5 x 2.5 cm.  The ovary is best seen transabdominally.  No abnormal adnexal masses.  Other findings: Minimal free fluid is demonstrated in the cul-de- sac.  Note that the patient is unable to empty the bladder completely in the bladder is shown in the distended state.  Suggestion of internal echoes within the bladder may represent infection or artifact.  IMPRESSION: Intrauterine device is present in the endometrium, located in the lower half of the fundic region with apparent extension into the lower uterine segment.  Small amount of free fluid in the pelvis. No abnormal adnexal masses.   Original Report Authenticated By: Marlon Pel, M.D.      1. Urinary tract infection   2. Nausea       MDM  26 year old female with a known bladder disorder  presenting with a urinary tract infection. Her pelvic ultrasound not showing any acute abnormality related to her ovary uterus. She is filling a lot better after receiving pain medication, nausea medication. Improved abdominal exam prior to discharge. 1 g IV Rocephin given. She was discharged with Vantin and Zofran. She will call her urologist in the morning. Return precautions discussed. Case discussed with Dr. Dierdre Highman who agrees with plan of care.  Trevor Mace, PA-C 11/26/11 [phone]

## 2011-11-26 NOTE — ED Notes (Addendum)
C/o multiple sx: c/o UTI sx, sob, nausea, dizziness, "feel like passing out", epigastric burning. [place]. "Took septra before lying down, woke with sob & other sx". Nasal congestion noted (cannot breathe thru nose). R eye red.

## 2011-11-27 NOTE — ED Provider Notes (Signed)
.  att  Sunnie Nielsen, MD 11/27/11 0600

## 2011-11-29 LAB — URINE CULTURE: Colony Count: >=100000

## 2011-11-30 NOTE — ED Notes (Signed)
+   Urine Patient treated with Bactrim-sensitive to same-chart appended per protocol MD. 

## 2011-12-24 ENCOUNTER — Emergency Department (HOSPITAL_COMMUNITY)
Admission: EM | Admit: 2011-12-24 | Discharge: 2011-12-25 | Disposition: A | Discharge status: Home / Self Care | Payer: Medicaid Other | Attending: Emergency Medicine | Admitting: Emergency Medicine

## 2011-12-24 ENCOUNTER — Encounter (HOSPITAL_COMMUNITY): Payer: Self-pay | Admitting: *Deleted

## 2011-12-24 DIAGNOSIS — R109 Unspecified abdominal pain: Secondary | ICD-10-CM | POA: Insufficient documentation

## 2011-12-24 DIAGNOSIS — R0602 Shortness of breath: Secondary | ICD-10-CM | POA: Insufficient documentation

## 2011-12-24 DIAGNOSIS — Z87440 Personal history of urinary (tract) infections: Secondary | ICD-10-CM | POA: Insufficient documentation

## 2011-12-24 DIAGNOSIS — Z87448 Personal history of other diseases of urinary system: Secondary | ICD-10-CM | POA: Insufficient documentation

## 2011-12-24 DIAGNOSIS — R079 Chest pain, unspecified: Secondary | ICD-10-CM | POA: Insufficient documentation

## 2011-12-24 DIAGNOSIS — Z87442 Personal history of urinary calculi: Secondary | ICD-10-CM | POA: Insufficient documentation

## 2011-12-24 DIAGNOSIS — R112 Nausea with vomiting, unspecified: Secondary | ICD-10-CM | POA: Insufficient documentation

## 2011-12-24 DIAGNOSIS — N898 Other specified noninflammatory disorders of vagina: Secondary | ICD-10-CM | POA: Insufficient documentation

## 2011-12-24 DIAGNOSIS — K2970 Gastritis, unspecified, without bleeding: Secondary | ICD-10-CM | POA: Insufficient documentation

## 2011-12-24 DIAGNOSIS — R0609 Other forms of dyspnea: Secondary | ICD-10-CM | POA: Insufficient documentation

## 2011-12-24 DIAGNOSIS — Z79899 Other long term (current) drug therapy: Secondary | ICD-10-CM | POA: Insufficient documentation

## 2011-12-24 DIAGNOSIS — K2990 Gastroduodenitis, unspecified, without bleeding: Secondary | ICD-10-CM | POA: Insufficient documentation

## 2011-12-24 DIAGNOSIS — R0600 Dyspnea, unspecified: Secondary | ICD-10-CM

## 2011-12-24 DIAGNOSIS — R0989 Other specified symptoms and signs involving the circulatory and respiratory systems: Secondary | ICD-10-CM | POA: Insufficient documentation

## 2011-12-24 DIAGNOSIS — Z87728 Personal history of other specified (corrected) congenital malformations of nervous system and sense organs: Secondary | ICD-10-CM | POA: Insufficient documentation

## 2011-12-24 LAB — CBC WITH DIFFERENTIAL/PLATELET
Basophils Absolute: 0 10*3/uL (ref 0.0–0.1)
Basophils Relative: 0 % (ref 0–1)
Lymphocytes Relative: 14 % (ref 12–46)
Neutro Abs: 9.1 10*3/uL — ABNORMAL HIGH (ref 1.7–7.7)
Platelets: 227 10*3/uL (ref 150–400)
RDW: 15.4 % (ref 11.5–15.5)
WBC: 11.1 10*3/uL — ABNORMAL HIGH (ref 4.0–10.5)

## 2011-12-24 LAB — POCT I-STAT, CHEM 8
Calcium, Ion: 1.25 mmol/L — ABNORMAL HIGH (ref 1.12–1.23)
HCT: 43 % (ref 36.0–46.0)
Hemoglobin: 14.6 g/dL (ref 12.0–15.0)
TCO2: 27 mmol/L (ref 0–100)

## 2011-12-24 LAB — D-DIMER, QUANTITATIVE: D-Dimer, Quant: 2.6 ug/mL-FEU — ABNORMAL HIGH (ref 0.00–0.48)

## 2011-12-24 MED ORDER — GI COCKTAIL ~~LOC~~
30.0000 mL | Freq: Once | ORAL | Status: AC
  Administered 2011-12-24: 30 mL via ORAL
  Filled 2011-12-24: qty 30

## 2011-12-24 NOTE — ED Provider Notes (Signed)
History     CSN: [id number]  Arrival date & time 12/24/11  [place]   First MD Initiated Contact with Patient 12/24/11 2130      Chief complaint: Chest pain, abdominal pain  (Consider location/radiation/quality/duration/timing/severity/associated sxs/prior treatment) The history is provided by the patient.  26 -year-old female comes in with an episode of chest pain which started about 5 PM today. She describes a burning pain in the mid chest with associated dyspnea, nausea, vomiting. That pain has resolved and is completely gone. It was 5/10 at its worst. She's also been having chronic upper abdominal pain which waxes and wanes and is currently present at 3/10. When pain is present, nothing made it better nothing made it worse. She had a similar episode about one month ago and was seen in emergency department. She had a Mirena ring  inserted 2 months ago and her gynecologist is concerned that the symptoms may be related to the Mirena ring and is considering removing it. Also, she has been having heavy vaginal bleeding for the last 4 days and just got a prescription today for something to try and slow the bleeding down.  Past Medical History  Diagnosis Date  . Spina bifida   . Bladder disorder   . Kidney stone   . UTI (lower urinary tract infection)   . Kidney stones     Past Surgical History  Procedure Date  . Spina bifida   . Lithotripsy     Family History  Problem Relation Age of Onset  . Other Neg Hx   . Scoliosis Mother     History  Substance Use Topics  . Smoking status: Never Smoker   . Smokeless tobacco: Not on file  . Alcohol Use: No    OB History    Grav Para Term Preterm Abortions TAB SAB Ect Mult Living   7 2 2  4  4   2       Review of Systems  All other systems reviewed and are negative.    Allergies  Nitrofurantoin monohyd macro and Penicillins  Home Medications   Current Outpatient Rx  Name  Route  Sig  Dispense  Refill  . CARVEDILOL 6.25 MG PO  TABS   Oral   Take 6.25 mg by mouth 2 (two) times daily with a meal.         . SULFAMETHOXAZOLE-TMP DS 800-160 MG PO TABS   Oral   Take 1 tablet by mouth daily as needed. For prevention of infection after intercourse         . TRIAMTERENE-HCTZ 37.5-25 MG PO CAPS   Oral   Take 1 capsule by mouth daily.           BP 114/74  Pulse 57  Temp 97.7 F (36.5 C) (Oral)  Resp 13  SpO2 100%  LMP 11/12/2011  Breastfeeding? Unknown  Physical Exam  Nursing note and vitals reviewed. 26 year old female, resting comfortably and in no acute distress. Vital signs are as significant for hypertension with blood pressure 141/95. Oxygen saturation is 100%, which is normal. Head is normocephalic and atraumatic. PERRLA, EOMI. Oropharynx is clear. Neck is nontender and supple without adenopathy or JVD. Back is nontender and there is no CVA tenderness. Lungs are clear without rales, wheezes, or rhonchi. Chest is nontender. Heart has regular rate and rhythm without murmur. Abdomen is soft, flat, nontender without masses or hepatosplenomegaly and peristalsis is normoactive. Extremities have no cyanosis or edema, full range of motion is  present. Skin is warm and dry without rash. Neurologic: Mental status is normal, cranial nerves are intact, there are no motor or sensory deficits.   ED Course  Procedures (including critical care time)  Results for orders placed during the hospital encounter of 12/24/11  POCT I-STAT, CHEM 8      Component Value Range   Sodium 142  135 - 145 mEq/L   Potassium 3.4 (*) 3.5 - 5.1 mEq/L   Chloride 102  96 - 112 mEq/L   BUN 13  6 - 23 mg/dL   Creatinine, Ser 7.82  0.50 - 1.10 mg/dL   Glucose, Bld 70  70 - 99 mg/dL   Calcium, Ion 9.56 (*) 1.12 - 1.23 mmol/L   TCO2 27  0 - 100 mmol/L   Hemoglobin 14.6  12.0 - 15.0 g/dL   HCT 21.3  08.6 - 57.8 %  CBC WITH DIFFERENTIAL      Component Value Range   WBC 11.1 (*) 4.0 - 10.5 K/uL   RBC 5.39 (*) 3.87 - 5.11 MIL/uL     Hemoglobin 14.1  12.0 - 15.0 g/dL   HCT 46.9  62.9 - 52.8 %   MCV 72.4 (*) 78.0 - 100.0 fL   MCH 26.2  26.0 - 34.0 pg   MCHC 36.2 (*) 30.0 - 36.0 g/dL   RDW 41.3  24.4 - 01.0 %   Platelets 227  150 - 400 K/uL   Neutrophils Relative 82 (*) 43 - 77 %   Neutro Abs 9.1 (*) 1.7 - 7.7 K/uL   Lymphocytes Relative 14  12 - 46 %   Lymphs Abs 1.5  0.7 - 4.0 K/uL   Monocytes Relative 4  3 - 12 %   Monocytes Absolute 0.4  0.1 - 1.0 K/uL   Eosinophils Relative 1  0 - 5 %   Eosinophils Absolute 0.1  0.0 - 0.7 K/uL   Basophils Relative 0  0 - 1 %   Basophils Absolute 0.0  0.0 - 0.1 K/uL  D-DIMER, QUANTITATIVE      Component Value Range   D-Dimer, Quant 2.60 (*) 0.00 - 0.48 ug/mL-FEU  POCT PREGNANCY, URINE      Component Value Range   Preg Test, Ur NEGATIVE  NEGATIVE   Ct Angio Chest W/cm &/or Wo Cm  12/25/2011  *RADIOLOGY REPORT*  Clinical Data: Shortness of breath, chest pain.  CT ANGIOGRAPHY CHEST  Technique:  Multidetector CT imaging of the chest using the standard protocol during bolus administration of intravenous contrast. Multiplanar reconstructed images including MIPs were obtained and reviewed to evaluate the vascular anatomy.  Contrast: 80mL OMNIPAQUE IOHEXOL 350 MG/ML SOLN  Comparison: None.  Findings: No filling defects in the pulmonary arteries to suggest pulmonary emboli. Heart is normal size. Aorta is normal caliber. No mediastinal, hilar, or axillary adenopathy.  Visualized thyroid and chest wall soft tissues unremarkable.  Lungs are clear.  No focal airspace opacities or suspicious nodules.  No effusions.  Imaging into the upper abdomen demonstrates what appears to be diffuse wall thickening throughout the stomach, but the stomach is decompressed and difficult to evaluate.  Recommend clinical correlation to exclude gastritis.  Benign-appearing cyst in the upper pole of the right kidney.  No acute bony abnormality.  IMPRESSION: No evidence of pulmonary embolus.  No acute findings in the  chest.  Apparent diffuse gastric wall thickening, but the stomach is decompressed which may be the cause of the apparent wall thickening.  Recommend clinical correlation to exclude gastritis.  Original Report Authenticated By: Charlett Nose, M.D.     ECG shows normal sinus rhythm with a rate of 96, no ectopy. Normal axis. Normal P wave. Normal QRS. Normal intervals. Normal ST and T waves. Impression: normal ECG. No prior ECG available for comparison.   1. Dyspnea   2. Chest pain   3. Gastritis       MDM  Chest pain of uncertain cause. Old records are reviewed and she was seen in one month ago for similar complaints with relatively little workup done at that time. Given the fact that she would be at risk for thromboembolic disease because of the Mirena ring, d-dimer will be obtained to screen for pulmonary embolism. If negative, she will discharged as her symptoms have essentially resolved. She is given a dose of a GI cocktail to try and help her abdominal pain.  She got significant relief with her GI cocktail. D-dimer is come back elevated and CT angiogram has been ordered which shows no evidence of pulmonary emboli. However, thickening of the stomach wall was identified which is consistent with gastritis which would be consistent with her stomach symptoms. She'll need to follow up with her gynecologist regarding her Mirena ring, but she will be discharged today with reassurance regarding her chest pain and dyspnea and she will be given a prescription for Protonix.     Dione Booze, MD 12/25/11 [phone]

## 2011-12-24 NOTE — ED Notes (Signed)
Pt assisted to the restroom per request; UA cup given;

## 2011-12-24 NOTE — ED Notes (Signed)
Pt reports one episode of vomiting in the waiting room; pt reports emesis being large in quantity and consistent with meal from today.

## 2011-12-24 NOTE — ED Notes (Signed)
Pt is here with burning in chest and problems breathing and nose running.  Pt has been taking bp pill and now with vaginal bleeding heavy for 2 weeks and pt had marana placed for 2 months.  Pt took bactrim for the first time and has never had that problem before.  Pt is talking in full sentences.  Sats wnl.  No swelling of lips, tongue, or throat.  Pt gets sob with walking and just started today about one hour ago and took bactrim about 1.5 hours ago

## 2011-12-25 ENCOUNTER — Emergency Department (HOSPITAL_COMMUNITY): Payer: Medicaid Other

## 2011-12-25 MED ORDER — PANTOPRAZOLE SODIUM 40 MG PO TBEC
40.0000 mg | DELAYED_RELEASE_TABLET | Freq: Once | ORAL | Status: AC
  Administered 2011-12-25: 40 mg via ORAL
  Filled 2011-12-25: qty 1

## 2011-12-25 MED ORDER — IOHEXOL 350 MG/ML SOLN
80.0000 mL | Freq: Once | INTRAVENOUS | Status: AC | PRN
  Administered 2011-12-25: 80 mL via INTRAVENOUS

## 2011-12-25 MED ORDER — PANTOPRAZOLE SODIUM 40 MG PO TBEC: 40.0000 mg | DELAYED_RELEASE_TABLET | Freq: Every day | ORAL | Status: DC

## 2011-12-25 NOTE — ED Notes (Signed)
Patient transported to CT 

## 2012-03-10 ENCOUNTER — Emergency Department (HOSPITAL_COMMUNITY)
Admission: EM | Admit: 2012-03-10 | Discharge: 2012-03-10 | Disposition: A | Discharge status: Home Health | Payer: Medicaid Other | Attending: Emergency Medicine | Admitting: Emergency Medicine

## 2012-03-10 ENCOUNTER — Encounter (HOSPITAL_COMMUNITY): Payer: Self-pay | Admitting: *Deleted

## 2012-03-10 DIAGNOSIS — Z87440 Personal history of urinary (tract) infections: Secondary | ICD-10-CM | POA: Insufficient documentation

## 2012-03-10 DIAGNOSIS — R339 Retention of urine, unspecified: Secondary | ICD-10-CM | POA: Insufficient documentation

## 2012-03-10 DIAGNOSIS — Z87442 Personal history of urinary calculi: Secondary | ICD-10-CM | POA: Insufficient documentation

## 2012-03-10 DIAGNOSIS — Z79899 Other long term (current) drug therapy: Secondary | ICD-10-CM | POA: Insufficient documentation

## 2012-03-10 DIAGNOSIS — N390 Urinary tract infection, site not specified: Secondary | ICD-10-CM

## 2012-03-10 DIAGNOSIS — Z87448 Personal history of other diseases of urinary system: Secondary | ICD-10-CM | POA: Insufficient documentation

## 2012-03-10 DIAGNOSIS — Z8739 Personal history of other diseases of the musculoskeletal system and connective tissue: Secondary | ICD-10-CM | POA: Insufficient documentation

## 2012-03-10 LAB — URINE MICROSCOPIC-ADD ON

## 2012-03-10 LAB — URINALYSIS, ROUTINE W REFLEX MICROSCOPIC
Ketones, ur: 40 mg/dL — AB
Nitrite: POSITIVE — AB
Protein, ur: 100 mg/dL — AB
Urobilinogen, UA: 1 mg/dL (ref 0.0–1.0)

## 2012-03-10 LAB — WET PREP, GENITAL: Clue Cells Wet Prep HPF POC: NONE SEEN

## 2012-03-10 MED ORDER — CIPROFLOXACIN HCL 500 MG PO TABS: 500.0000 mg | ORAL_TABLET | Freq: Two times a day (BID) | ORAL | Status: DC

## 2012-03-10 MED ORDER — PHENAZOPYRIDINE HCL 200 MG PO TABS: 200.0000 mg | ORAL_TABLET | Freq: Three times a day (TID) | ORAL | Status: DC

## 2012-03-10 MED ORDER — CIPROFLOXACIN HCL 500 MG PO TABS
500.0000 mg | ORAL_TABLET | Freq: Once | ORAL | Status: AC
  Administered 2012-03-10: 500 mg via ORAL
  Filled 2012-03-10: qty 1

## 2012-03-10 NOTE — ED Provider Notes (Signed)
History     CSN: [id number]  Arrival date & time 03/10/12  0911   First MD Initiated Contact with Patient 03/10/12 0935      Chief Complaint  Patient presents with  . urinary symptoms     (Consider location/radiation/quality/duration/timing/severity/associated sxs/prior treatment) HPI  27 year old female with history of spina bifida, bladder disorder, and recurrent urinary tract infection presents for evaluations of dysuria. 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 She has been self catheterize for the past year but suddenly more frequently after the Botox procedure. For the past 3 days she endorses low abnormal pain, body aches, and vomited last night, and having urinary discomfort. She believes she may have had a urinary tract infection. The onset was gradual, moderate in severity, pain is nonradiating. No decrease in appetite, no diarrhea. Patient denies vaginal discharge, or rash. Her last menstrual period was last month.  Patient also reports with recurrent urinary tract infection she has been on many different types of antibiotic. She is allergic to sulfa-containing medication and also allergy to Macrobid. She also mentioned that Keflex and Cipro used to work but no longer effective.  Past Medical History  Diagnosis Date  . Spina bifida   . Bladder disorder   . Kidney stone   . UTI (lower urinary tract infection)   . Kidney stones     Past Surgical History  Procedure Date  . Spina bifida   . Lithotripsy   . Bladder botox     Family History  Problem Relation Age of Onset  . Other Neg Hx   . Scoliosis Mother     History  Substance Use Topics  . Smoking status: Never Smoker   . Smokeless tobacco: Not on file  . Alcohol Use: No    OB History    Grav Para Term Preterm Abortions TAB SAB Ect Mult Living   7 2 2  4  4   2       Review of  Systems  Constitutional:       10 Systems reviewed and all are negative for acute change except as noted in the HPI.     Allergies  Nitrofurantoin monohyd macro; Penicillins; and Sulfa antibiotics  Home Medications   Current Outpatient Rx  Name  Route  Sig  Dispense  Refill  . TRIMETHOPRIM 100 MG PO TABS   Oral   Take 50 mg by mouth daily.           BP 140/87  Pulse 115  Temp 99.1 F (37.3 C) (Oral)  Resp 18  SpO2 99%  LMP 07/12/2011  Physical Exam  Nursing note and vitals reviewed. Constitutional: She appears well-developed and well-nourished. No distress.       Awake, alert, nontoxic appearance  HENT:  Head: Atraumatic.  Eyes: Conjunctivae normal are normal. Right eye exhibits no discharge. Left eye exhibits no discharge.  Neck: Neck supple.  Cardiovascular: Normal rate and regular rhythm.   Pulmonary/Chest: Effort normal. No respiratory distress. She exhibits no tenderness.  Abdominal: Soft. There is tenderness (Mild suprapubic tenderness without guarding or rebound tenderness. Negative Murphy's sign. No McBurney's point. No hernia noted.). There is no rebound. Hernia confirmed negative in the right inguinal area and confirmed negative in the left inguinal area.  Genitourinary: There is no rash on the right labia. There is no rash on the left labia. Cervix exhibits friability. Cervix exhibits no  motion tenderness and no discharge. Right adnexum displays no tenderness. Left adnexum displays no tenderness. No erythema, tenderness or bleeding around the vagina. No vaginal discharge found.       Chaperone present  Musculoskeletal: She exhibits no tenderness.       ROM appears intact, no obvious focal weakness  Neurological:       Mental status and motor strength appears intact  Skin: No rash noted.  Psychiatric: She has a normal mood and affect.    ED Course  Procedures (including critical care time)   Labs Reviewed  POCT PREGNANCY, URINE  URINALYSIS, ROUTINE W  REFLEX MICROSCOPIC   No results found.   No diagnosis found.  10:25 AM Patient was seen and evaluated. She has history of recurrent urinary tract infection and also history of bladder disorder requiring Botox injection. She has been having urinary retention requiring self cath daily. She is at risk for having recurrent urinary tract infection. We'll obtain UA, we'll perform pelvic exam further evaluation. Otherwise patient appears to be nontoxic, abdomen nonsurgical.   12:04 PM UA shows evidence of UTI.  Last urine culture shows that pt is sensitive to Cipro. Will treat with cipro.  No evidence concerning for PID.  Culture sent.  Pt recommended to f/u with PCP for further care.  Able to tolerates PO  BP 140/87  Pulse 115  Temp 99.1 F (37.3 C) (Oral)  Resp 18  SpO2 99%  LMP 07/12/2011  I have reviewed nursing notes and vital signs.  I reviewed available ER/hospitalization records thought the EMR  MDM  Patient presenting with symptoms consistent with urinary tract infection. No evidence for pyelonephritis, nephrolithiasis, traumatic injury, other significant pathology. Urinalysis shows findings consistent with UTI.  Pregnancy test was obtained and was negative.  GC/C  obtainedt.  Provided prescription for antibiotics. Advised to followup with primary physician if has continued symptoms. Return to ER if has uncontrolled pain, high fever, concern for dehydration, flank pain or other concerns.  Impression:  Urinary Tract Infection without evidence for pyelonephritis    Plan:  Discharge from ED   Ciprofloxacin 500 mg bid x 10 days.   Phenazopyridine(Pyridium) 100 mg PO, 2 tabs TID x 3 days  UTI prevention was discussed including post coital voiding, copious fluids and daily cranberry juice. F/U with PCP if pain continues  Informed to return to ER if has uncontrolled pain, high fever, concern for dehydration, flank pain or other concerns. Expressed understanding of and agreement  with plan and all questions answered.         Fayrene Helper, PA-C 03/10/12 1215

## 2012-03-10 NOTE — ED Provider Notes (Signed)
Medical screening examination/treatment/procedure(s) were performed by non-physician practitioner and as supervising physician I was immediately available for consultation/collaboration.   Carleene Cooper III, MD 03/10/12 2055

## 2012-03-10 NOTE — ED Notes (Signed)
Pt is here with not feeling well and thought she may have bad uti.  Pt states she vomited one time last nite.  Pt reports lower abdominal pain.  No diarrhea.

## 2012-03-10 NOTE — ED Notes (Signed)
Pt has to use in and out catheters to drain bladder for the last year

## 2012-03-11 LAB — GC/CHLAMYDIA PROBE AMP: GC Probe RNA: NEGATIVE

## 2012-03-13 LAB — URINE CULTURE: Colony Count: >=100000

## 2012-03-14 NOTE — ED Notes (Signed)
+   Urine Patient treated with Cipro-sensitive to same-chart appended per protocol MD. 

## 2012-05-01 ENCOUNTER — Encounter (HOSPITAL_COMMUNITY): Payer: Self-pay | Admitting: *Deleted

## 2012-05-01 ENCOUNTER — Inpatient Hospital Stay (HOSPITAL_COMMUNITY)
Admission: AD | Admit: 2012-05-01 | Discharge: 2012-05-01 | Disposition: A | Discharge status: Home / Self Care | Payer: Medicaid Other | Source: Ambulatory Visit | Attending: Obstetrics & Gynecology | Admitting: Obstetrics & Gynecology

## 2012-05-01 DIAGNOSIS — O2620 Pregnancy care for patient with recurrent pregnancy loss, unspecified trimester: Secondary | ICD-10-CM

## 2012-05-01 DIAGNOSIS — Z8759 Personal history of other complications of pregnancy, childbirth and the puerperium: Secondary | ICD-10-CM | POA: Diagnosis present

## 2012-05-01 DIAGNOSIS — K5909 Other constipation: Secondary | ICD-10-CM | POA: Diagnosis present

## 2012-05-01 DIAGNOSIS — K5900 Constipation, unspecified: Secondary | ICD-10-CM | POA: Insufficient documentation

## 2012-05-01 DIAGNOSIS — O039 Complete or unspecified spontaneous abortion without complication: Secondary | ICD-10-CM | POA: Insufficient documentation

## 2012-05-01 LAB — URINALYSIS, ROUTINE W REFLEX MICROSCOPIC
Glucose, UA: NEGATIVE mg/dL
Leukocytes, UA: NEGATIVE
Protein, ur: NEGATIVE mg/dL
Specific Gravity, Urine: 1.015 (ref 1.005–1.030)

## 2012-05-01 LAB — ABO/RH: ABO/RH(D): O POS

## 2012-05-01 LAB — CBC
Hemoglobin: 12.5 g/dL (ref 12.0–15.0)
MCHC: 35.1 g/dL (ref 30.0–36.0)
RDW: 15.3 % (ref 11.5–15.5)
WBC: 6.3 10*3/uL (ref 4.0–10.5)

## 2012-05-01 LAB — HCG, QUANTITATIVE, PREGNANCY: hCG, Beta Chain, Quant, S: 2 m[IU]/mL (ref <5)

## 2012-05-01 LAB — POCT PREGNANCY, URINE: Preg Test, Ur: NEGATIVE

## 2012-05-01 LAB — WET PREP, GENITAL: Clue Cells Wet Prep HPF POC: NONE SEEN

## 2012-05-01 MED ORDER — PROMETHAZINE HCL 25 MG PO TABS: 12.5000 mg | ORAL_TABLET | Freq: Four times a day (QID) | ORAL | Status: DC | PRN

## 2012-05-01 MED ORDER — IBUPROFEN 600 MG PO TABS: 600.0000 mg | ORAL_TABLET | Freq: Four times a day (QID) | ORAL | Status: DC | PRN

## 2012-05-01 NOTE — MAU Note (Signed)
Pt LMP 03/17/2012, + home UPT, bleeding like a period over the weekend.  Bleeding has stopped, now feeling nausea and abd pain.

## 2012-05-01 NOTE — MAU Provider Note (Signed)
Chief Complaint: Possible Pregnancy, Vaginal Bleeding, Abdominal Pain and Nausea   First Provider Initiated Contact with Patient 05/01/12 2038     SUBJECTIVE HPI: Hayley Calhoun is a 27 y.o. [id number] at [redacted]w[redacted]d by LMP who presents to maternity admissions reporting positive HPT x2, first one 2 weeks ago, then heavy vaginal bleeding 2 days ago, reduced today to brown spotting.  She has irregular heavy periods normally but has hx of 5 miscarriages and feels like this it what she is having now.  She also has abdominal pain described as cramping in her mid to upper abdomen "like when you have the stomach bug" and she has vomited x2 today and been nauseous all day.  She reports having tried every form of birth control without success because of side effects and is interested in having her tubes tied because of her frequent miscarriages.  She has seen a private MD in the past but would like to see the clinic to discuss contraception/BTL. She denies vaginal itching/burning, h/a, dizziness, n/v, or fever/chills.     Past Medical History  Diagnosis Date  . Spina bifida   . Bladder disorder   . Kidney stone   . UTI (lower urinary tract infection)   . Kidney stones    Past Surgical History  Procedure Laterality Date  . Spina bifida    . Lithotripsy    . Bladder botox     History   Social History  . Marital Status: Married    Spouse Name: N/A    Number of Children: N/A  . Years of Education: N/A   Occupational History  . Not on file.   Social History Main Topics  . Smoking status: Never Smoker   . Smokeless tobacco: Not on file  . Alcohol Use: No  . Drug Use: No  . Sexually Active: Yes   Other Topics Concern  . Not on file   Social History Narrative  . No narrative on file   No current facility-administered medications on file prior to encounter.   Current Outpatient Prescriptions on File Prior to Encounter  Medication Sig Dispense Refill  . trimethoprim (TRIMPEX) 100 MG tablet  Take 50 mg by mouth at bedtime.        Allergies  Allergen Reactions  . Sulfa Antibiotics Shortness Of Breath, Itching and Other (See Comments)    Itching and burning in chest; and could not breathe.  . Nitrofurantoin Monohyd Macro Nausea And Vomiting  . Penicillins Other (See Comments)    Childhood reaction unknown    ROS: Pertinent items in HPI  OBJECTIVE Blood pressure 123/81, pulse 101, temperature 98.8 F (37.1 C), temperature source Oral, resp. rate 16, height 5\' 4"  (1.626 m), weight 60.056 kg (132 lb 6.4 oz), last menstrual period 07/12/2011. GENERAL: Well-developed, well-nourished female in no acute distress.  HEENT: Normocephalic HEART: normal rate RESP: normal effort ABDOMEN: Soft, non-tender EXTREMITIES: Nontender, no edema NEURO: Alert and oriented Pelvic exam: Cervix pink, visually closed, without lesion, scant dark brown blood, vaginal walls and external genitalia normal Bimanual exam: Cervix 0/long/high, firm, anterior, neg CMT, uterus nontender, nonenlarged, adnexa without tenderness, enlargement, or mass  LAB RESULTS Results for orders placed during the hospital encounter of 05/01/12 (from the past 24 hour(s))  URINALYSIS, ROUTINE W REFLEX MICROSCOPIC     Status: None   Collection Time    05/01/12  7:35 PM      Result Value Range   Color, Urine YELLOW  YELLOW   APPearance CLEAR  CLEAR   Specific Gravity, Urine 1.015  1.005 - 1.030   pH 7.5  5.0 - 8.0   Glucose, UA NEGATIVE  NEGATIVE mg/dL   Hgb urine dipstick NEGATIVE  NEGATIVE   Bilirubin Urine NEGATIVE  NEGATIVE   Ketones, ur NEGATIVE  NEGATIVE mg/dL   Protein, ur NEGATIVE  NEGATIVE mg/dL   Urobilinogen, UA 1.0  0.0 - 1.0 mg/dL   Nitrite NEGATIVE  NEGATIVE   Leukocytes, UA NEGATIVE  NEGATIVE  ABO/RH     Status: None   Collection Time    05/01/12  7:40 PM      Result Value Range   ABO/RH(D) O POS    CBC     Status: Abnormal   Collection Time    05/01/12  7:40 PM      Result Value Range   WBC 6.3   4.0 - 10.5 K/uL   RBC 4.81  3.87 - 5.11 MIL/uL   Hemoglobin 12.5  12.0 - 15.0 g/dL   HCT 47.8 (*) 29.5 - 62.1 %   MCV 74.0 (*) 78.0 - 100.0 fL   MCH 26.0  26.0 - 34.0 pg   MCHC 35.1  30.0 - 36.0 g/dL   RDW 30.8  65.7 - 84.6 %   Platelets 275  150 - 400 K/uL  HCG, QUANTITATIVE, PREGNANCY     Status: None   Collection Time    05/01/12  7:40 PM      Result Value Range   hCG, Beta Chain, Quant, S 2  <5 mIU/mL  POCT PREGNANCY, URINE     Status: None   Collection Time    05/01/12  7:53 PM      Result Value Range   Preg Test, Ur NEGATIVE  NEGATIVE    ASSESSMENT 1. Spontaneous miscarriage   2. History of miscarriage   3. Chronic constipation     PLAN Discharge home with bleeding precautions F/U in Gyn clinic in 2 weeks for follow up labs/contraceptive management Ibuprofen 600 mg PO Q 6 hours Phenergan 12.5-25 mg Q 6 hours Return to MAU as needed   Sharen Counter Certified Nurse-Midwife 05/01/2012  9:08 PM

## 2012-05-15 ENCOUNTER — Ambulatory Visit (INDEPENDENT_AMBULATORY_CARE_PROVIDER_SITE_OTHER): Payer: Medicaid Other | Admitting: Medical

## 2012-05-15 ENCOUNTER — Encounter: Payer: Self-pay | Admitting: Medical

## 2012-05-15 VITALS — BP 131/87 | HR 81 | Temp 98.1°F | Ht 64.0 in | Wt 131.9 lb

## 2012-05-15 DIAGNOSIS — Z8742 Personal history of other diseases of the female genital tract: Secondary | ICD-10-CM

## 2012-05-15 DIAGNOSIS — R110 Nausea: Secondary | ICD-10-CM

## 2012-05-15 DIAGNOSIS — Z8759 Personal history of other complications of pregnancy, childbirth and the puerperium: Secondary | ICD-10-CM

## 2012-05-15 MED ORDER — ONDANSETRON HCL 8 MG PO TABS: 8.0000 mg | ORAL_TABLET | Freq: Three times a day (TID) | ORAL | Status: DC | PRN

## 2012-05-15 NOTE — Progress Notes (Signed)
Patient ID: Hayley Calhoun, female   DOB: August 12, 1985, 27 y.o.   MRN: [id number]  History:  Hayley Calhoun  is a 27 y.o. [id number] who presents to clinic today for follow-up after SAB. The patient was seen in MAU on 04/30/12 for SAB. She chose expectant management at that time. She states that she bled x 6 days and has had no bleeding since. She is still having occasional lower abdominal discomfort. She rates this pain at 4/10 at the worst. The patient also has an extensive history of bladder issues. She has had Botox to help with incontinence issues and is now unable to void without self-catheterization. She is on prophylactic Trimethoprim for UTIs. The patient also states that she has had N/V recently. It does not seem to be associated with her diet. It is not everyday. She denies fever. She is a patient of [facility]. The patient has had two full-term deliveries and six miscarriages. She is interested in birth control. She has tried OCPs, depo provera, nuvaring, ortho evra and mirena and has always had side effects of headaches, nausea or increased BP. She is interested in trying the Nexplanon, but would also like to consider BTL if a cause for her miscarriages cannot be defined.   The following portions of the patient's history were reviewed and updated as appropriate: allergies, current medications, past family history, past medical history, past social history, past surgical history and problem list.  Review of Systems:  Pertinent items are noted in HPI.  Objective:  Physical Exam BP 131/87  Pulse 81  Temp(Src) 98.1 F (36.7 C) (Oral)  Ht 5\' 4"  (1.626 m)  Wt 131 lb 14.4 oz (59.829 kg)  BMI 22.63 kg/m2  LMP 11/12/2011  Breastfeeding? No GENERAL: Well-developed, well-nourished female in no acute distress.  HEENT: Normocephalic, atraumatic.  LUNGS: Normal rate. Clear to auscultation bilaterally.  HEART: Regular rate and rhythm with no adventitious sounds.  ABDOMEN: Soft,  nontender, nondistended. No organomegaly. Normal bowel sounds appreciated in all quadrants.  PELVIC: Normal external female genitalia. Vagina is pink and rugated.  Normal discharge. Normal cervix contour. Uterus is normal in size. No adnexal mass or tenderness.  EXTREMITIES: No cyanosis, clubbing, or edema.  Labs and Imaging Quant bhCG and Thrombophilia panel today  Assessment & Plan:  Assessment: SAB  Plans: Thrombophilia panel and quant bhCG today Patient will return in 2 weeks for follow-up, lab results and Nexplanon insertion vs. BTL work-up If quant bhCG is not < 5 patient will return for lab draw in 1 week  Freddi Starr, PA-C 05/15/2012 2:09 PM

## 2012-05-15 NOTE — Patient Instructions (Addendum)
Laparoscopic Tubal Ligation Laparoscopic tubal ligation is a procedure that closes the fallopian tubes at a time other than right after childbirth. By closing the fallopian tubes, the eggs that are released from the ovaries cannot enter the uterus and sperm cannot reach the egg. Tubal ligation is also known as getting your "tubes tied." Tubal ligation is done so you will not be able to get pregnant or have a baby.  Although this procedure may be reversed, it should be considered permanent and irreversible. If you want to have future pregnancies, you should not have this procedure.  LET YOUR CAREGIVER KNOW ABOUT:  Allergies to food or medicine.  Medicines taken, including vitamins, herbs, eyedrops, over-the-counter medicines, and creams.  Use of steroids (by mouth or creams).  Previous problems with numbing medicines.  History of bleeding problems or blood clots.  Any recent colds or infections.  Previous surgery.  Other health problems, including diabetes and kidney problems.  Possibility of pregnancy, if this applies.  Any past pregnancies. RISKS AND COMPLICATIONS   Infection.  Bleeding.  Injury to surrounding organs.  Anesthetic side effects.  Failure of the procedure.  Ectopic pregnancy.  Future regret about having the procedure done. BEFORE THE PROCEDURE  Do not take aspirin or blood thinners a week before the procedure or as directed. This can cause bleeding.  Do not eat or drink anything 6 to 8 hours before the procedure. PROCEDURE   You may be given a medicine to help you relax (sedative) before the procedure. You will be given a medicine to make you sleep (general anesthetic) during the procedure.  A tube will be put down your throat to help your breath while under general anesthesia.  Two small cuts (incisions) are made in the lower abdominal area and near the belly button.  Your abdominal area will be inflated with a safe gas (carbon dioxide). This helps  give the surgeon room to operate, visualize, and helps the surgeon avoid other organs.  A thin, lighted tube (laparoscope) with a camera attached is inserted into your abdomen through one of the incisions near the belly button. Other small instruments are also inserted through the other abdominal incision.  The fallopian tubes are located and are either blocked with a ring, clip, or are burned (cauterized).  After the fallopian tubes are blocked, the gas is released from the abdomen.  The incisions will be closed with stitches (sutures), and a bandage may be placed over the incisions. AFTER THE PROCEDURE   You will rest in a recovery room for 1 4 hours until you are stable and doing well.  You will also have some mild abdominal discomfort for 3 7 days. You will be given pain medicine to ease any discomfort.  As long as there are no problems, you may be allowed to go home. Someone will need to drive you home and be with you for at least 24 hours once home.  You may have some mild discomfort in the throat. This is from the tube placed in your throat while you were sleeping.  You may experience discomfort in the shoulder area from some trapped air between the liver and diaphragm. This sensation is normal and will slowly go away on its own. Document Released: 05/07/2000 Document Revised: 07/31/2011 Document Reviewed: 05/12/2011 [facility] Patient Information 2013 [place], [place]. Etonogestrel implant What is this medicine? ETONOGESTREL is a contraceptive (birth control) device. It is used to prevent pregnancy. It can be used for up to 3 years. This  medicine may be used for other purposes; ask your health care provider or pharmacist if you have questions. What should I tell my health care provider before I take this medicine? They need to know if you have any of these conditions: -abnormal vaginal bleeding -blood vessel disease or blood clots -cancer of the breast, cervix, or  liver -depression -diabetes -gallbladder disease -headaches -heart disease or recent heart attack -high blood pressure -high cholesterol -kidney disease -liver disease -renal disease -seizures -tobacco smoker -an unusual or allergic reaction to etonogestrel, other hormones, anesthetics or antiseptics, medicines, foods, dyes, or preservatives -pregnant or trying to get pregnant -breast-feeding How should I use this medicine? This device is inserted just under the skin on the inner side of your upper arm by a health care professional. Talk to your pediatrician regarding the use of this medicine in children. Special care may be needed. Overdosage: If you think you've taken too much of this medicine contact a poison control center or emergency room at once. Overdosage: If you think you have taken too much of this medicine contact a poison control center or emergency room at once. NOTE: This medicine is only for you. Do not share this medicine with others. What if I miss a dose? This does not apply. What may interact with this medicine? Do not take this medicine with any of the following medications: -amprenavir -bosentan -fosamprenavir This medicine may also interact with the following medications: -barbiturate medicines for inducing sleep or treating seizures -certain medicines for fungal infections like ketoconazole and itraconazole -griseofulvin -medicines to treat seizures like carbamazepine, felbamate, oxcarbazepine, phenytoin, topiramate -modafinil -phenylbutazone -rifampin -some medicines to treat HIV infection like atazanavir, indinavir, lopinavir, nelfinavir, tipranavir, ritonavir -St. John's wort This list may not describe all possible interactions. Give your health care provider a list of all the medicines, herbs, non-prescription drugs, or dietary supplements you use. Also tell them if you smoke, drink alcohol, or use illegal drugs. Some items may interact with your  medicine. What should I watch for while using this medicine? This product does not protect you against HIV infection (AIDS) or other sexually transmitted diseases. You should be able to feel the implant by pressing your fingertips over the skin where it was inserted. Tell your doctor if you cannot feel the implant. What side effects may I notice from receiving this medicine? Side effects that you should report to your doctor or health care professional as soon as possible: -allergic reactions like skin rash, itching or hives, swelling of the face, lips, or tongue -breast lumps -changes in vision -confusion, trouble speaking or understanding -dark urine -depressed mood -general ill feeling or flu-like symptoms -light-colored stools -loss of appetite, nausea -right upper belly pain -severe headaches -severe pain, swelling, or tenderness in the abdomen -shortness of breath, chest pain, swelling in a leg -signs of pregnancy -sudden numbness or weakness of the face, arm or leg -trouble walking, dizziness, loss of balance or coordination -unusual vaginal bleeding, discharge -unusually weak or tired -yellowing of the eyes or skin Side effects that usually do not require medical attention (Report these to your doctor or health care professional if they continue or are bothersome.): -acne -breast pain -changes in weight -cough -fever or chills -headache -irregular menstrual bleeding -itching, burning, and vaginal discharge -pain or difficulty passing urine -sore throat This list may not describe all possible side effects. Call your doctor for medical advice about side effects. You may report side effects to FDA at 1-800-FDA-1088. Where should I  keep my medicine? This drug is given in a hospital or clinic and will not be stored at home. NOTE: This sheet is a summary. It may not cover all possible information. If you have questions about this medicine, talk to your doctor, pharmacist, or  health care provider.  2013, [employer]. (10/22/2008 3:54:17 PM)

## 2012-05-15 NOTE — Progress Notes (Signed)
Came into MAU on 3/20 for SAB (5w gestation). No bleeding since then but has been experiencing frequent abdominal pains "like a stomach bug" as well as some nausea & vomiting on daily basis (not always related to eating) since the SAB. Has had 6 miscarriages now. Not currently on birth control. Has used mirena, nuvaring, depo, pills, and patch all cause constant nausea or high blood pressure. Interested in BTL if cause of miscarriages cannot be determined. Needs hcg level drawn

## 2012-05-16 LAB — PROTHROMBIN GENE MUTATION

## 2012-05-16 LAB — ANTITHROMBIN III: AntiThromb III Func: 119 % (ref 76–126)

## 2012-05-16 LAB — HCG, QUANTITATIVE, PREGNANCY: hCG, Beta Chain, Quant, S: <2 m[IU]/mL

## 2012-05-16 LAB — CARDIOLIPIN ANTIBODIES, IGG, IGM, IGA
Anticardiolipin IgA: 5 APL U/mL (ref <22)
Anticardiolipin IgG: 7 GPL U/mL (ref <23)

## 2012-05-16 LAB — LUPUS ANTICOAGULANT PANEL
DRVVT: 28.1 secs (ref <42.9)
Lupus Anticoagulant: NOT DETECTED

## 2012-06-05 ENCOUNTER — Other Ambulatory Visit: Payer: Self-pay | Admitting: Family Medicine

## 2012-06-05 ENCOUNTER — Ambulatory Visit (INDEPENDENT_AMBULATORY_CARE_PROVIDER_SITE_OTHER): Payer: Medicaid Other | Admitting: Family Medicine

## 2012-06-05 ENCOUNTER — Encounter: Payer: Self-pay | Admitting: Family Medicine

## 2012-06-05 VITALS — BP 128/88 | HR 68 | Temp 99.0°F | Ht 64.0 in | Wt 132.1 lb

## 2012-06-05 DIAGNOSIS — Z8742 Personal history of other diseases of the female genital tract: Secondary | ICD-10-CM

## 2012-06-05 DIAGNOSIS — Z8759 Personal history of other complications of pregnancy, childbirth and the puerperium: Secondary | ICD-10-CM

## 2012-06-05 DIAGNOSIS — Q059 Spina bifida, unspecified: Secondary | ICD-10-CM

## 2012-06-05 LAB — POCT PREGNANCY, URINE: Preg Test, Ur: NEGATIVE

## 2012-06-05 NOTE — Patient Instructions (Signed)
Recurrent Miscarriage Recurrent miscarriage means that a woman has lost two or more pregnancies in a row. The loss happens before 20 weeks of pregnancy. Primary recurrent miscarriage is with a woman that has never been able to give birth to a child. Secondary recurrent miscarriage is a woman who had a child and then had two or more miscarriages in a row.  CAUSES   Your parents passed it to you (genetic).  Chromosomal defects.  Endocrine disorders. A person's endocrine system includes glands that make hormones.  Having a lack of progesterone hormone in early pregnancy.  Thyroid problems.  Insulin resistance seen in diabetic women and women with Polycystic Ovary Syndrome that is hard to control.  Abnormalities of the uterus.  Certain viral and germ (bacterial) infections.  Autoimmune disorders. This is when the immune system attacks or destroys healthy body tissue.  Smoking, drinking or taking drugs or medicines.  Being exposed to certain chemicals or toxins at home or work. HOME CARE INSTRUCTIONS   See your caregiver if you have two or more miscarriages.  Follow the advice for testing and treatment that your caregiver recommends.  Discuss any concerns or questions with your caregiver.  Do not smoke or drink alcohol when pregnant.  Recurrent miscarriages can have a severe emotional and psychological effect on a couple wanting to have children. They may have feelings of anger, blame, guilt and become depressed after losing a pregnancy many times. Join a support group or see a grief counselor if you have emotional problems because of pregnancy loss. SEEK MEDICAL CARE IF:   You are or think you are pregnant and have any kind of vaginal spotting or bleeding.  You develop low abdominal cramps.  You develop a fever of 102 F (38.9 C) or higher.  You develop abnormal vaginal discharge.  You have been or think you have been exposed to a spreadable (contagious) illness, toxins or  chemicals that make you sick to your stomach (nauseated) or throw up (vomit).  You think you have been exposed to a sexually transmitted disease. Document Released: 07/18/2007 Document Revised: 04/23/2011 Document Reviewed: 07/18/2007 ExitCare Patient Information 2013 [employer].  

## 2012-06-06 ENCOUNTER — Encounter: Payer: Self-pay | Admitting: Family Medicine

## 2012-06-06 DIAGNOSIS — Q059 Spina bifida, unspecified: Secondary | ICD-10-CM | POA: Insufficient documentation

## 2012-06-06 NOTE — Progress Notes (Signed)
  Subjective:    Patient ID: Hayley Calhoun, female    DOB: 19-Jun-1985, 28 y.o.   MRN: [id number]  HPI  [id number] with h/o term pregnancy, followed by 3 SAB's and then term pregnancy followed by 3 SAB's. She was here for possible Nexplanon insertion, but after risk review, she declined this.  She has had a thrombophilia panel to w/u recurrent Ab's which was negative. Normal serum  BS as well.  Review of Systems  Constitutional: Negative for fever and chills.  Respiratory: Negative for shortness of breath.   Cardiovascular: Negative for chest pain.  Gastrointestinal: Negative for abdominal pain.  Genitourinary: Negative for vaginal bleeding and pelvic pain.  Skin: Negative for rash.       Objective:   Physical Exam  Vitals reviewed. Constitutional: She appears well-developed and well-nourished.  HENT:  Head: Normocephalic.  Neck: Neck supple.  Cardiovascular: Normal rate.   Pulmonary/Chest: Effort normal.  Abdominal: Soft. There is no tenderness.          Assessment & Plan:

## 2012-06-06 NOTE — Assessment & Plan Note (Signed)
Given history, I believe pt. Is in need of karyotype as is her husband.  Balanced Translocation may be the culprit.  Will refer to Dr. April Manson for his input and further w/u.  Advised condom use for now.

## 2012-11-28 ENCOUNTER — Encounter (HOSPITAL_COMMUNITY): Payer: Self-pay | Admitting: *Deleted

## 2012-11-28 ENCOUNTER — Inpatient Hospital Stay (HOSPITAL_COMMUNITY)
Admission: AD | Admit: 2012-11-28 | Discharge: 2012-11-28 | Disposition: A | Discharge status: Home / Self Care | Payer: Medicaid Other | Source: Ambulatory Visit | Attending: Obstetrics and Gynecology | Admitting: Obstetrics and Gynecology

## 2012-11-28 ENCOUNTER — Inpatient Hospital Stay (HOSPITAL_COMMUNITY): Payer: Medicaid Other

## 2012-11-28 DIAGNOSIS — O26899 Other specified pregnancy related conditions, unspecified trimester: Secondary | ICD-10-CM

## 2012-11-28 DIAGNOSIS — Z8759 Personal history of other complications of pregnancy, childbirth and the puerperium: Secondary | ICD-10-CM

## 2012-11-28 DIAGNOSIS — R109 Unspecified abdominal pain: Secondary | ICD-10-CM | POA: Insufficient documentation

## 2012-11-28 DIAGNOSIS — O210 Mild hyperemesis gravidarum: Secondary | ICD-10-CM | POA: Insufficient documentation

## 2012-11-28 DIAGNOSIS — O2620 Pregnancy care for patient with recurrent pregnancy loss, unspecified trimester: Secondary | ICD-10-CM | POA: Insufficient documentation

## 2012-11-28 DIAGNOSIS — O219 Vomiting of pregnancy, unspecified: Secondary | ICD-10-CM

## 2012-11-28 DIAGNOSIS — O99891 Other specified diseases and conditions complicating pregnancy: Secondary | ICD-10-CM | POA: Insufficient documentation

## 2012-11-28 LAB — URINALYSIS, ROUTINE W REFLEX MICROSCOPIC
Bilirubin Urine: NEGATIVE
Ketones, ur: NEGATIVE mg/dL
Nitrite: POSITIVE — AB
Protein, ur: NEGATIVE mg/dL
Urobilinogen, UA: 0.2 mg/dL (ref 0.0–1.0)

## 2012-11-28 LAB — CBC WITH DIFFERENTIAL/PLATELET
Basophils Absolute: 0 10*3/uL (ref 0.0–0.1)
Basophils Relative: 1 % (ref 0–1)
Eosinophils Absolute: 0.1 10*3/uL (ref 0.0–0.7)
Eosinophils Relative: 2 % (ref 0–5)
HCT: 33.1 % — ABNORMAL LOW (ref 36.0–46.0)
MCH: 23.4 pg — ABNORMAL LOW (ref 26.0–34.0)
MCHC: 34.7 g/dL (ref 30.0–36.0)
MCV: 67.4 fL — ABNORMAL LOW (ref 78.0–100.0)
Monocytes Absolute: 0.3 10*3/uL (ref 0.1–1.0)
Platelets: 260 10*3/uL (ref 150–400)
RBC: 4.91 MIL/uL (ref 3.87–5.11)
RDW: 18.2 % — ABNORMAL HIGH (ref 11.5–15.5)
WBC: 5.1 10*3/uL (ref 4.0–10.5)

## 2012-11-28 LAB — URINE MICROSCOPIC-ADD ON

## 2012-11-28 MED ORDER — ONDANSETRON HCL 4 MG PO TABS: 4.0000 mg | ORAL_TABLET | Freq: Three times a day (TID) | ORAL | Status: DC | PRN

## 2012-11-28 NOTE — MAU Provider Note (Signed)
History     CSN: [id number]  Arrival date and time: 11/28/12 1109   First Provider Initiated Contact with Patient 11/28/12 1221      Chief Complaint  Patient presents with  . Fatigue  . Possible Pregnancy  . Abdominal Cramping   HPI Hayley Calhoun is 27 y.o. [id number] ? weeks presenting with nausea in early pregnancy.  She has hx of repeated miscarriages and is concerned.  LMP 10/15/12.  Denies vaginal bleeding and abdominal pain but does have cramping and feels weak.   Had to leave work. Appetite normal.  Recent interstem placement by urologist in [place] the first of September.  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  Planned pregnancy.  Married 4 years.  Neg cultures in March-she declines cultures.  Past Medical History  Diagnosis Date  . Spina bifida   . Bladder disorder   . Kidney stone   . UTI (lower urinary tract infection)   . Kidney stones     Past Surgical History  Procedure Laterality Date  . Spina bifida    . Lithotripsy    . Bladder botox      Family History  Problem Relation Age of Onset  . Other Neg Hx   . Scoliosis Mother     History  Substance Use Topics  . Smoking status: Never Smoker   . Smokeless tobacco: Never Used  . Alcohol Use: No    Allergies:  Allergies  Allergen Reactions  . Sulfa Antibiotics Shortness Of Breath, Itching and Other (See Comments)    Itching and burning in chest; and could not breathe.  . Nitrofurantoin Monohyd Macro Nausea And Vomiting  . Penicillins Other (See Comments)    Childhood reaction unknown    No prescriptions prior to admission    Review of Systems  Constitutional: Negative for fever and chills.  Gastrointestinal: Positive for nausea and abdominal pain (cramping). Negative for vomiting.  Genitourinary: Negative for dysuria, urgency, frequency and hematuria.       Neg for vaginal bleeding or discharge  Neurological: Negative for headaches.   Physical Exam   Blood  pressure 123/71, pulse 85, temperature 98.4 F (36.9 C), temperature source Oral, resp. rate 16, height 5\' 4"  (1.626 m), weight 138 lb 12.8 oz (62.959 kg), last menstrual period 10/16/2011, SpO2 100.00%.  Physical Exam  Constitutional: She is oriented to person, place, and time. She appears well-developed and well-nourished. No distress.  HENT:  Head: Normocephalic.  Neck: Normal range of motion.  Cardiovascular: Normal rate.   Respiratory: Effort normal.  GI: Soft. She exhibits no distension and no mass. There is no tenderness. There is no rebound and no guarding.  Genitourinary: There is no rash, tenderness or lesion on the right labia. There is no rash, tenderness or lesion on the left labia. Uterus is enlarged (slightly). Uterus is not tender. Right adnexum displays no mass, no tenderness and no fullness. Left adnexum displays no mass, no tenderness and no fullness. No tenderness or bleeding around the vagina. No vaginal discharge found.  Neurological: She is alert and oriented to person, place, and time.  Skin: Skin is warm and dry.  Psychiatric: She has a normal mood and affect. Her behavior is normal.   BLOOD TYPE O Positive  Results for orders placed during the hospital encounter of 11/28/12 (from the past 24 hour(s))  URINALYSIS, ROUTINE W REFLEX MICROSCOPIC     Status: Abnormal   Collection Time    11/28/12 11:25  AM      Result Value Range   Color, Urine YELLOW  YELLOW   APPearance CLEAR  CLEAR   Specific Gravity, Urine 1.020  1.005 - 1.030   pH 7.0  5.0 - 8.0   Glucose, UA NEGATIVE  NEGATIVE mg/dL   Hgb urine dipstick NEGATIVE  NEGATIVE   Bilirubin Urine NEGATIVE  NEGATIVE   Ketones, ur NEGATIVE  NEGATIVE mg/dL   Protein, ur NEGATIVE  NEGATIVE mg/dL   Urobilinogen, UA 0.2  0.0 - 1.0 mg/dL   Nitrite POSITIVE (*) NEGATIVE   Leukocytes, UA NEGATIVE  NEGATIVE  URINE MICROSCOPIC-ADD ON     Status: Abnormal   Collection Time    11/28/12 11:25 AM      Result Value Range    Squamous Epithelial / LPF RARE  RARE   WBC, UA 7-10  <3 WBC/hpf   Bacteria, UA MANY (*) RARE  POCT PREGNANCY, URINE     Status: Abnormal   Collection Time    11/28/12 11:29 AM      Result Value Range   Preg Test, Ur POSITIVE (*) NEGATIVE  HCG, QUANTITATIVE, PREGNANCY     Status: Abnormal   Collection Time    11/28/12 12:35 PM      Result Value Range   hCG, Beta Chain, Quant, S 14412 (*) <5 mIU/mL  CBC WITH DIFFERENTIAL     Status: Abnormal   Collection Time    11/28/12 12:35 PM      Result Value Range   WBC 5.1  4.0 - 10.5 K/uL   RBC 4.91  3.87 - 5.11 MIL/uL   Hemoglobin 11.5 (*) 12.0 - 15.0 g/dL   HCT 40.9 (*) 81.1 - 91.4 %   MCV 67.4 (*) 78.0 - 100.0 fL   MCH 23.4 (*) 26.0 - 34.0 pg   MCHC 34.7  30.0 - 36.0 g/dL   RDW 78.2 (*) 95.6 - 21.3 %   Platelets 260  150 - 400 K/uL   Neutrophils Relative % 64  43 - 77 %   Neutro Abs 3.3  1.7 - 7.7 K/uL   Lymphocytes Relative 27  12 - 46 %   Lymphs Abs 1.4  0.7 - 4.0 K/uL   Monocytes Relative 6  3 - 12 %   Monocytes Absolute 0.3  0.1 - 1.0 K/uL   Eosinophils Relative 2  0 - 5 %   Eosinophils Absolute 0.1  0.0 - 0.7 K/uL   Basophils Relative 1  0 - 1 %   Basophils Absolute 0.0  0.0 - 0.1 K/uL  US Ob Comp Less 14 Wks  11/28/2012   CLINICAL DATA:  Pelvic pain and cramping. Previous spontaneous abortions. Gestational age by LMP of 6 weeks 2 days.  EXAM: OBSTETRIC <14 WK [place] AND TRANSVAGINAL OB US  TECHNIQUE: Both transabdominal and transvaginal ultrasound examinations were performed for complete evaluation of the gestation as well as the maternal uterus, adnexal regions, and pelvic cul-de-sac. Transvaginal technique was performed to assess early pregnancy.  COMPARISON:  None.  FINDINGS: Intrauterine gestational sac: Visualized/normal in shape.  Yolk sac:  Visualized  Embryo:  Visualized  Cardiac Activity: Visualized  Heart Rate:  97 bpm  CRL:   3  mm   5 w 6 d                  [place] EDC: 07/25/2013  Maternal uterus/adnexae: Both ovaries are  normal in appearance. No adnexal mass or free fluid identified.  IMPRESSION: Single living  IUP measuring 5 weeks 6 days, which is concordant with LMP.  No significant maternal uterine or adnexal abnormality identified.   Electronically Signed   By: Myles Rosenthal M.D.   On: 11/28/2012 13:31   US Ob Transvaginal  11/28/2012   CLINICAL DATA:  Pelvic pain and cramping. Previous spontaneous abortions. Gestational age by LMP of 6 weeks 2 days.  EXAM: OBSTETRIC <14 WK [place] AND TRANSVAGINAL OB US  TECHNIQUE: Both transabdominal and transvaginal ultrasound examinations were performed for complete evaluation of the gestation as well as the maternal uterus, adnexal regions, and pelvic cul-de-sac. Transvaginal technique was performed to assess early pregnancy.  COMPARISON:  None.  FINDINGS: Intrauterine gestational sac: Visualized/normal in shape.  Yolk sac:  Visualized  Embryo:  Visualized  Cardiac Activity: Visualized  Heart Rate:  97 bpm  CRL:   3  mm   5 w 6 d                  [place] EDC: 07/25/2013  Maternal uterus/adnexae: Both ovaries are normal in appearance. No adnexal mass or free fluid identified.  IMPRESSION: Single living IUP measuring 5 weeks 6 days, which is concordant with LMP.  No significant maternal uterine or adnexal abnormality identified.   Electronically Signed   By: Myles Rosenthal M.D.   On: 11/28/2012 13:31   MAU Course  Procedures  Patient declined cultures--neg in March-married X  4 years  MDM Reported labs and U/S results to the patient. At time discharge instructions were given, the patient is asking for Rx for nausea and a doctors note for work  Assessment and Plan  A:  Cramping in early pregnancy [redacted]w[redacted]d by U/S-(By LMP [redacted]w[redacted]d)      Viable intrauterine pregnancy      Hx of repeated miscarriages     Nausea in early pregnancy  P:  Begin prenatal care with the clinic     Prenatal vitamins OTC 1 daily      Rx for Zofran for nausea  Morrison Masser,EVE M 11/28/2012, 12:22 PM

## 2012-11-28 NOTE — MAU Note (Signed)
Patient states she has had a positive home pregnancy test about 2 weeks ago. States she feels very weak, has had some nausea and vomiting for the past 2 days and has slight cramping.

## 2012-11-30 LAB — URINE CULTURE: Colony Count: >=100000

## 2012-12-01 ENCOUNTER — Telehealth: Payer: Self-pay | Admitting: Medical

## 2012-12-01 ENCOUNTER — Other Ambulatory Visit: Payer: Self-pay | Admitting: Medical

## 2012-12-01 DIAGNOSIS — N390 Urinary tract infection, site not specified: Secondary | ICD-10-CM

## 2012-12-01 MED ORDER — CEPHALEXIN 500 MG PO CAPS: 500.0000 mg | ORAL_CAPSULE | Freq: Three times a day (TID) | ORAL | Status: DC

## 2012-12-01 NOTE — MAU Provider Note (Signed)
Attestation of Attending Supervision of Advanced Practitioner (CNM/NP): Evaluation and management procedures were performed by the Advanced Practitioner under my supervision and collaboration.  I have reviewed the Advanced Practitioner's note and chart, and I agree with the management and plan.  Burle Kwan 12/01/2012 2:49 PM

## 2012-12-01 NOTE — Telephone Encounter (Signed)
Called patient to inform her of urine culture + for UTI. Patient states unknown childhood allergy to PCN. She states that she feels that she has had Keflex in the past without issues. Rx for Keflex sent to patient's pharmacy. Patient advised to seek immediate medical attention if she develops any kind of reaction. Patient voiced understanding.   Freddi Starr, PA-C 12/01/2012 8:16 AM

## 2012-12-09 ENCOUNTER — Inpatient Hospital Stay (HOSPITAL_COMMUNITY): Payer: Medicaid Other

## 2012-12-09 ENCOUNTER — Encounter (HOSPITAL_COMMUNITY): Payer: Self-pay | Admitting: *Deleted

## 2012-12-09 ENCOUNTER — Telehealth: Payer: Self-pay | Admitting: *Deleted

## 2012-12-09 ENCOUNTER — Inpatient Hospital Stay (HOSPITAL_COMMUNITY)
Admission: AD | Admit: 2012-12-09 | Discharge: 2012-12-09 | Disposition: A | Discharge status: Home / Self Care | Payer: Medicaid Other | Source: Ambulatory Visit | Attending: Obstetrics & Gynecology | Admitting: Obstetrics & Gynecology

## 2012-12-09 DIAGNOSIS — L210 Seborrhea capitis: Secondary | ICD-10-CM | POA: Insufficient documentation

## 2012-12-09 DIAGNOSIS — O210 Mild hyperemesis gravidarum: Secondary | ICD-10-CM | POA: Insufficient documentation

## 2012-12-09 DIAGNOSIS — O99891 Other specified diseases and conditions complicating pregnancy: Secondary | ICD-10-CM | POA: Insufficient documentation

## 2012-12-09 DIAGNOSIS — O219 Vomiting of pregnancy, unspecified: Secondary | ICD-10-CM

## 2012-12-09 DIAGNOSIS — K219 Gastro-esophageal reflux disease without esophagitis: Secondary | ICD-10-CM | POA: Insufficient documentation

## 2012-12-09 DIAGNOSIS — K5900 Constipation, unspecified: Secondary | ICD-10-CM

## 2012-12-09 LAB — URINE MICROSCOPIC-ADD ON

## 2012-12-09 LAB — WET PREP, GENITAL: Clue Cells Wet Prep HPF POC: NONE SEEN

## 2012-12-09 LAB — URINALYSIS, ROUTINE W REFLEX MICROSCOPIC
Bilirubin Urine: NEGATIVE
Ketones, ur: NEGATIVE mg/dL
Nitrite: POSITIVE — AB
Protein, ur: NEGATIVE mg/dL
Urobilinogen, UA: 0.2 mg/dL (ref 0.0–1.0)
pH: 8 (ref 5.0–8.0)

## 2012-12-09 LAB — OB RESULTS CONSOLE GC/CHLAMYDIA
Chlamydia: NEGATIVE
Gonorrhea: NEGATIVE

## 2012-12-09 MED ORDER — FAMOTIDINE IN NACL 20-0.9 MG/50ML-% IV SOLN
20.0000 mg | Freq: Once | INTRAVENOUS | Status: AC
  Administered 2012-12-09: 20 mg via INTRAVENOUS
  Filled 2012-12-09: qty 50

## 2012-12-09 MED ORDER — SODIUM CHLORIDE 0.9 % IV SOLN
INTRAVENOUS | Status: DC
  Administered 2012-12-09: 15:00:00 via INTRAVENOUS

## 2012-12-09 MED ORDER — PROMETHAZINE HCL 25 MG PO TABS: 25.0000 mg | ORAL_TABLET | Freq: Four times a day (QID) | ORAL | Status: DC | PRN

## 2012-12-09 MED ORDER — DOXYLAMINE-PYRIDOXINE 10-10 MG PO TBEC: 1.0000 | DELAYED_RELEASE_TABLET | Freq: Once | ORAL | Status: DC

## 2012-12-09 MED ORDER — LACTATED RINGERS IV BOLUS (SEPSIS)
1000.0000 mL | Freq: Once | INTRAVENOUS | Status: AC
  Administered 2012-12-09: 1000 mL via INTRAVENOUS

## 2012-12-09 MED ORDER — GLYCOPYRROLATE 1 MG PO TABS: 1.0000 mg | ORAL_TABLET | Freq: Three times a day (TID) | ORAL | Status: DC

## 2012-12-09 MED ORDER — POLYETHYLENE GLYCOL 3350 17 GM/SCOOP PO POWD: 17.0000 g | Freq: Every day | ORAL | Status: DC

## 2012-12-09 MED ORDER — DOXYLAMINE-PYRIDOXINE 10-10 MG PO TBEC: 2.0000 | DELAYED_RELEASE_TABLET | Freq: Every day | ORAL | Status: DC

## 2012-12-09 MED ORDER — DEXTROSE 5 % IV SOLN
1.0000 g | Freq: Once | INTRAVENOUS | Status: AC
  Administered 2012-12-09: 1 g via INTRAVENOUS
  Filled 2012-12-09: qty 10

## 2012-12-09 MED ORDER — DOCUSATE SODIUM 100 MG PO CAPS: 100.0000 mg | ORAL_CAPSULE | Freq: Two times a day (BID) | ORAL | Status: DC

## 2012-12-09 MED ORDER — FAMOTIDINE 20 MG PO TABS: 20.0000 mg | ORAL_TABLET | Freq: Two times a day (BID) | ORAL | Status: DC

## 2012-12-09 MED ORDER — GLYCOPYRROLATE 0.2 MG/ML IJ SOLN
0.1000 mg | Freq: Once | INTRAMUSCULAR | Status: AC
  Administered 2012-12-09: 0.1 mg via INTRAVENOUS
  Filled 2012-12-09 (×2): qty 0.5

## 2012-12-09 MED ORDER — ONDANSETRON HCL 4 MG/2ML IJ SOLN
4.0000 mg | Freq: Once | INTRAMUSCULAR | Status: AC
  Administered 2012-12-09: 4 mg via INTRAVENOUS
  Filled 2012-12-09: qty 2

## 2012-12-09 NOTE — Telephone Encounter (Signed)
Pt left message stating that she cannot keep anything down- not food or water. She is supposed to be taking antibiotic for UTI but since she cannot eat, she is not taking it. Upon chart review, observed pt is currently being evaluated @ MAU for these complaints. Call to pt not indicated at this time.

## 2012-12-09 NOTE — MAU Note (Signed)
Can't keep anything down, even fluids. On meds for a UTI, having cramping- knows  That is still a problem, because she can't take her meds.  Also has been constipated.  Was also put on zofran, not working- hasn't taken it in 3 days.

## 2012-12-09 NOTE — MAU Provider Note (Signed)
History     CSN: [id number]  Arrival date and time: 12/09/12 5784   None     Chief Complaint  Patient presents with  . Emesis During Pregnancy  . Abdominal Cramping   HPI Pt present with N/V in pregnancy unable to hold down anything to eat.  Pt has been taking zofran without relief. Pt has spina bifida and has seen urologist in [facility] with interstem device that has been turned off since pt found out she was pregnant. Pt is on schedule for self cath. Pt has had a UTI and was given Keflex but pt unable to keep medicine down.   RN note: Can't keep anything down, even fluids. On meds for a UTI, having cramping- knows That is still a problem, because she can't take her meds. Also has been constipated. Was also put on zofran, not working- hasn't taken it in 3 days.   Past Medical History  Diagnosis Date  . Spina bifida   . Bladder disorder   . Kidney stone   . UTI (lower urinary tract infection)   . Kidney stones     Past Surgical History  Procedure Laterality Date  . Spina bifida    . Lithotripsy    . Bladder botox    . Interstim implant placement    . Wisdom tooth extraction      Family History  Problem Relation Age of Onset  . Other Neg Hx   . Scoliosis Mother     History  Substance Use Topics  . Smoking status: Never Smoker   . Smokeless tobacco: Never Used  . Alcohol Use: No    Allergies:  Allergies  Allergen Reactions  . Sulfa Antibiotics Shortness Of Breath, Itching and Other (See Comments)    Itching and burning in chest; and could not breathe.  . Nitrofurantoin Monohyd Macro Nausea And Vomiting  . Penicillins Other (See Comments)    Childhood reaction unknown    Prescriptions prior to admission  Medication Sig Dispense Refill  . cephALEXin (KEFLEX) 500 MG capsule Take 1 capsule (500 mg total) by mouth 3 (three) times daily.  21 capsule  0  . ondansetron (ZOFRAN) 4 MG tablet Take 1 tablet (4 mg total) by mouth every 8 (eight) hours as  needed for nausea.  20 tablet  0    Review of Systems  Constitutional: Negative for fever and chills.  Gastrointestinal: Positive for nausea, vomiting and constipation. Negative for abdominal pain and diarrhea.  Genitourinary: Negative for dysuria and urgency.   Physical Exam   Blood pressure 121/74, pulse 83, temperature 98.7 F (37.1 C), temperature source Oral, resp. rate 16, height 5' 3.5" (1.613 m), weight 60.328 kg (133 lb), last menstrual period 10/15/2012.  Physical Exam  MAU Course  Procedures US Ob Comp Less 14 Wks  12/09/2012   CLINICAL DATA:  Cramping  EXAM: OBSTETRIC <14 WK ULTRASOUND  TECHNIQUE: Transabdominal ultrasound was performed for evaluation of the gestation as well as the maternal uterus and adnexal regions.  COMPARISON:  11/28/2012  FINDINGS: Intrauterine gestational sac: Visualized/normal in shape.  Yolk sac:  Visualized  Embryo:  Visualized  Cardiac Activity: Visualized  Heart Rate: 165 bpm  MSD:   mm    w     d  CRL:   13.7  mm   7 w 5 d                  [place] EDC: 07/23/2013  Maternal uterus/adnexae: Small subchorionic hemorrhage. No  adnexal masses or free fluid.  IMPRESSION: Seven week 5 day intrauterine pregnancy. Fetal heart rate 165 beats per min. Small subchorionic hemorrhage.   Electronically Signed   By: Charlett Nose M.D.   On: 12/09/2012 12:55   Results for orders placed during the hospital encounter of 12/09/12 (from the past 24 hour(s))  URINALYSIS, ROUTINE W REFLEX MICROSCOPIC     Status: Abnormal   Collection Time    12/09/12 10:15 AM      Result Value Range   Color, Urine YELLOW  YELLOW   APPearance CLOUDY (*) CLEAR   Specific Gravity, Urine 1.020  1.005 - 1.030   pH 8.0  5.0 - 8.0   Glucose, UA NEGATIVE  NEGATIVE mg/dL   Hgb urine dipstick NEGATIVE  NEGATIVE   Bilirubin Urine NEGATIVE  NEGATIVE   Ketones, ur NEGATIVE  NEGATIVE mg/dL   Protein, ur NEGATIVE  NEGATIVE mg/dL   Urobilinogen, UA 0.2  0.0 - 1.0 mg/dL   Nitrite POSITIVE (*) NEGATIVE    Leukocytes, UA MODERATE (*) NEGATIVE  URINE MICROSCOPIC-ADD ON     Status: Abnormal   Collection Time    12/09/12 10:15 AM      Result Value Range   Squamous Epithelial / LPF FEW (*) RARE   WBC, UA 11-20  <3 WBC/hpf   Bacteria, UA MANY (*) RARE   Urine-Other MUCOUS PRESENT     Rocephin 1 gm IV for UTI since could not take PO Keflex Assessment and Plan  Nausea and vomiting in pregnancy- Diclegis, Phenergan, Zofran RX GERD - Pepcid Pityriasis- Robinul 1mg  F/u with urologist Keep OB appointment in clinic Nov 6   Enis Leatherwood 12/09/2012, 10:51 AM

## 2012-12-10 LAB — URINE CULTURE: Colony Count: >=100000

## 2012-12-11 LAB — GC/CHLAMYDIA PROBE AMP
CT Probe RNA: NEGATIVE
GC Probe RNA: NEGATIVE

## 2012-12-11 NOTE — MAU Provider Note (Signed)
Attestation of Attending Supervision of Advanced Practitioner (PA/CNM/NP): Evaluation and management procedures were performed by the Advanced Practitioner under my supervision and collaboration.  I have reviewed the Advanced Practitioner's note and chart, and I agree with the management and plan.  Dearra Myhand, MD, FACOG Attending Obstetrician & Gynecologist Faculty Practice, [facility]  

## 2012-12-18 ENCOUNTER — Ambulatory Visit (INDEPENDENT_AMBULATORY_CARE_PROVIDER_SITE_OTHER): Payer: Medicaid Other | Admitting: Family

## 2012-12-18 ENCOUNTER — Encounter: Payer: Self-pay | Admitting: Family

## 2012-12-18 VITALS — BP 133/87 | Temp 97.6°F | Wt 133.6 lb

## 2012-12-18 DIAGNOSIS — O039 Complete or unspecified spontaneous abortion without complication: Secondary | ICD-10-CM

## 2012-12-18 DIAGNOSIS — Q059 Spina bifida, unspecified: Secondary | ICD-10-CM

## 2012-12-18 DIAGNOSIS — O0990 Supervision of high risk pregnancy, unspecified, unspecified trimester: Secondary | ICD-10-CM | POA: Insufficient documentation

## 2012-12-18 DIAGNOSIS — O211 Hyperemesis gravidarum with metabolic disturbance: Secondary | ICD-10-CM

## 2012-12-18 DIAGNOSIS — Z8742 Personal history of other diseases of the female genital tract: Secondary | ICD-10-CM

## 2012-12-18 LAB — POCT URINALYSIS DIP (DEVICE)
Bilirubin Urine: NEGATIVE
Ketones, ur: NEGATIVE mg/dL
Specific Gravity, Urine: 1.02 (ref 1.005–1.030)

## 2012-12-18 LAB — US OB COMP LESS 14 WKS

## 2012-12-18 NOTE — Addendum Note (Signed)
Addended by: Franchot Mimes on: 12/18/2012 10:09 AM   Modules accepted: Orders

## 2012-12-18 NOTE — Progress Notes (Signed)
Informal [place] for FHR = 175 per PW doppler. Eino Farber [facility] CNM notified.

## 2012-12-18 NOTE — Progress Notes (Signed)
P= 88 Pt. Reports aching feet in the morning when she wakes up; states it dissapates throughout the day.  Discussed appropriate weight gain based on BMI for this pregnancy; pt. Verbalizes understanding.  New OB packet given.  Pt. Refuses flu vaccine today; states she will consider it. Information sheet given.

## 2012-12-18 NOTE — Progress Notes (Signed)
Nutrition note: 1st visit consult Pt has gained 4.6# @ 9w, which is slightly > expected. Pt reports eating 2 meals & 1-2 snacks/d. Pt is taking 2 chewable multivitamins/d. Pt reports having N/V, heartburn and constipation. Pt reports her heartburn is under control with meds but is still having N/V daily while taking Zofran. Pt received verbal and written education on general nutrition during pregnancy. Discussed tips to decrease N/V. Discussed wt gain goals of 25-35# or 1#/wk in 2nd & 3rd trimester. Pt agrees to continue taking PNV equivalent. Pt to try Phenergan as prescribed to help decrease/ prevent N/V. Pt does not have WIC but plans to apply. Pt plans to BF. F/u if referred Blondell Reveal, MS, RD, LDN

## 2012-12-18 NOTE — Progress Notes (Signed)
Subjective:    Hayley Calhoun is a [id number] [redacted]w[redacted]d being seen today for her first obstetrical visit.  Her obstetrical history is significant for multiple SABs and 20 wk fetal loss.  Pt also diagnosed with Spinal Bifida and has had bladder surgery.  Currently self-caths to empty bladder.  Hx of two term vaginal deliveries.  . Patient does intend to breast feed. Pregnancy history fully reviewed.  New OB visit; reviewed practice information and OB visit schedule; discussed genetic screening, desires first screen.  Pt had a pap smear completed with [facility] 7 months ago, reports normal results.  New OB labs today.    Patient reports vomiting.  Filed Vitals:   12/18/12 0819  BP: 133/87  Temp: 97.6 F (36.4 C)  Weight: 133 lb 9.6 oz (60.601 kg)    HISTORY: OB History  Gravida Para Term Preterm AB SAB TAB Ectopic Multiple Living  9 2 2  0 6 6 0 0 0 2    # Outcome Date GA Lbr Len/2nd Weight Sex Delivery Anes PTL Lv  9 CUR           8 SAB 04/2012 [redacted]w[redacted]d         7 SAB 2013 [redacted]w[redacted]d         6 SAB 2012 [redacted]w[redacted]d            Comments: 5 wks  5 TRM 02/10/09 [redacted]w[redacted]d  8 lb 10 oz (3.912 kg) M SVD EPI  Y     Comments: Gestational hypertension towards the end of pregnancy   4 SAB 2009 [redacted]w[redacted]d   M         Comments: Bleeding; complete dilation upon arrival  3 SAB 2008 [redacted]w[redacted]d         2 SAB 2008 [redacted]w[redacted]d         1 TRM 05/15/05 [redacted]w[redacted]d  5 lb 6 oz (2.438 kg) F SVD EPI  Y     Comments: Pt. reports gestational hypertension; remembers having protein in urine     Past Medical History  Diagnosis Date  . Spina bifida   . Bladder disorder   . Kidney stone   . UTI (lower urinary tract infection)   . Kidney stones    Past Surgical History  Procedure Laterality Date  . Spina bifida    . Lithotripsy    . Bladder botox    . Interstim implant placement    . Wisdom tooth extraction     Family History  Problem Relation Age of Onset  . Other Neg Hx   . Scoliosis Mother      Exam    System: Breast:  normal appearance,  no masses or tenderness, Inspection negative   Skin: normal coloration and turgor, no rashes    Neurologic: oriented, normal   Extremities: normal strength, tone, and muscle mass   Neck supple and no masses   Cardiovascular: regular rate and rhythm   Respiratory:  appears well, vitals normal, no respiratory distress, acyanotic, normal RR, ear and throat exam is normal, neck free of mass or lymphadenopathy, chest clear, no wheezing, crepitations, rhonchi, normal symmetric air entry   Abdomen: soft, non-tender; bowel sounds normal; no masses,  no organomegaly      Assessment:    Pregnancy: 27 yo [id number] at 9.0 wks IUP  Patient Active Problem List   Diagnosis Date Noted  . Supervision of high-risk pregnancy 12/18/2012  . Spina bifida 06/06/2012  . History of miscarriage 05/01/2012  . Spontaneous miscarriage 05/01/2012  .  Chronic constipation 05/01/2012  . URINARY RETENTION 02/10/2009        Plan:     Initial labs drawn. Prenatal vitamins. Refer to MFM for Nuchal and review of OB/Medical history and plan of care. Problem list reviewed and updated. Genetic Screening discussed First Screen: requested.  Ultrasound discussed; fetal survey: obtain at 18 wks.  Follow up in 3 weeks.  [facility] 12/18/2012

## 2012-12-18 NOTE — Progress Notes (Signed)
First Screen and consult scheduled with MFM on 01/13/13 at 8 am.

## 2012-12-19 LAB — OBSTETRIC PANEL
Antibody Screen: NEGATIVE
Eosinophils Absolute: 0.1 10*3/uL (ref 0.0–0.7)
HCT: 32.4 % — ABNORMAL LOW (ref 36.0–46.0)
Hemoglobin: 11.6 g/dL — ABNORMAL LOW (ref 12.0–15.0)
Lymphocytes Relative: 23 % (ref 12–46)
Lymphs Abs: 0.9 10*3/uL (ref 0.7–4.0)
Monocytes Absolute: 0.3 10*3/uL (ref 0.1–1.0)
Monocytes Relative: 8 % (ref 3–12)
Neutro Abs: 2.7 10*3/uL (ref 1.7–7.7)
Rh Type: POSITIVE
Rubella: 3.62 Index — ABNORMAL HIGH (ref <0.90)
WBC: 4 10*3/uL (ref 4.0–10.5)

## 2012-12-19 LAB — ALCOHOL METABOLITE (ETG), URINE: Ethyl Glucuronide (EtG): NEGATIVE ng/mL

## 2012-12-19 LAB — HIV ANTIBODY (ROUTINE TESTING W REFLEX): HIV: NONREACTIVE

## 2012-12-20 LAB — PRESCRIPTION MONITORING PROFILE (19 PANEL)
Benzodiazepine Screen, Urine: NEGATIVE ng/mL
Buprenorphine, Urine: NEGATIVE ng/mL
Cannabinoid Scrn, Ur: NEGATIVE ng/mL
Carisoprodol, Urine: NEGATIVE ng/mL
Cocaine Metabolites: NEGATIVE ng/mL
MDMA URINE: NEGATIVE ng/mL
Meperidine, Ur: NEGATIVE ng/mL
Methadone Screen, Urine: NEGATIVE ng/mL
Methaqualone: NEGATIVE ng/mL
Nitrites, Initial: NEGATIVE ug/mL
Phencyclidine, Ur: NEGATIVE ng/mL
Propoxyphene: NEGATIVE ng/mL
Tramadol Scrn, Ur: NEGATIVE ng/mL
Zolpidem, Urine: NEGATIVE ng/mL

## 2012-12-26 ENCOUNTER — Encounter: Payer: Self-pay | Admitting: *Deleted

## 2013-01-01 ENCOUNTER — Ambulatory Visit (INDEPENDENT_AMBULATORY_CARE_PROVIDER_SITE_OTHER): Payer: Medicaid Other | Admitting: Family

## 2013-01-01 VITALS — BP 142/90 | Temp 98.2°F | Wt 134.1 lb

## 2013-01-01 DIAGNOSIS — O09299 Supervision of pregnancy with other poor reproductive or obstetric history, unspecified trimester: Secondary | ICD-10-CM

## 2013-01-01 DIAGNOSIS — O0990 Supervision of high risk pregnancy, unspecified, unspecified trimester: Secondary | ICD-10-CM

## 2013-01-01 LAB — POCT URINALYSIS DIP (DEVICE)
Glucose, UA: NEGATIVE mg/dL
Hgb urine dipstick: NEGATIVE
Ketones, ur: NEGATIVE mg/dL
Specific Gravity, Urine: 1.01 (ref 1.005–1.030)
Urobilinogen, UA: 0.2 mg/dL (ref 0.0–1.0)

## 2013-01-01 NOTE — Progress Notes (Signed)
Doing well, no questions or concerns.  MFM consult and first screen scheduled for 01/13/13.  Monitor blood pressures closely.  Ultrasound for FHR - 168.

## 2013-01-01 NOTE — Progress Notes (Signed)
P=88,  States has not finished keflex yet because was having nausea/vomiting, but has restarted keflex again.

## 2013-01-02 NOTE — Progress Notes (Signed)
Informal [place] for FHR = 168 per PW doppler.  Eino Farber [facility] CNM notified.

## 2013-01-13 ENCOUNTER — Ambulatory Visit (HOSPITAL_COMMUNITY)
Admission: RE | Admit: 2013-01-13 | Discharge: 2013-01-13 | Disposition: A | Discharge status: Home / Self Care | Payer: Medicaid Other | Source: Ambulatory Visit | Attending: Family | Admitting: Family

## 2013-01-13 ENCOUNTER — Encounter (HOSPITAL_COMMUNITY): Payer: Self-pay

## 2013-01-13 ENCOUNTER — Ambulatory Visit (HOSPITAL_COMMUNITY): Admission: RE | Admit: 2013-01-13 | Payer: Medicaid Other | Source: Ambulatory Visit

## 2013-01-13 DIAGNOSIS — O09299 Supervision of pregnancy with other poor reproductive or obstetric history, unspecified trimester: Secondary | ICD-10-CM | POA: Insufficient documentation

## 2013-01-13 DIAGNOSIS — O351XX0 Maternal care for (suspected) chromosomal abnormality in fetus, not applicable or unspecified: Secondary | ICD-10-CM | POA: Insufficient documentation

## 2013-01-13 DIAGNOSIS — Z3689 Encounter for other specified antenatal screening: Secondary | ICD-10-CM | POA: Insufficient documentation

## 2013-01-13 DIAGNOSIS — O2620 Pregnancy care for patient with recurrent pregnancy loss, unspecified trimester: Secondary | ICD-10-CM | POA: Insufficient documentation

## 2013-01-13 DIAGNOSIS — O352XX0 Maternal care for (suspected) hereditary disease in fetus, not applicable or unspecified: Secondary | ICD-10-CM | POA: Insufficient documentation

## 2013-01-13 DIAGNOSIS — O0990 Supervision of high risk pregnancy, unspecified, unspecified trimester: Secondary | ICD-10-CM

## 2013-01-13 DIAGNOSIS — Q068 Other specified congenital malformations of spinal cord: Secondary | ICD-10-CM

## 2013-01-13 DIAGNOSIS — O3510X0 Maternal care for (suspected) chromosomal abnormality in fetus, unspecified, not applicable or unspecified: Secondary | ICD-10-CM | POA: Insufficient documentation

## 2013-01-13 NOTE — Consult Note (Signed)
Maternal Fetal Medicine Consultation  Requesting Provider(s): Sid Falcon, CNM  Reason for consultation: hx of recurrent losses, tethered cord / bladder dysfunction   Hayley Calhoun is a 27 yo [id number] currently at 12 6/7 weeks who was seen for consultation due to a personal history of tethered cord / bladder dysfunction and hx of recurrent losses.  Hayley Calhoun reports a long history of multiple urinary infections - she is currently followed by [facility].  She has required self-cath to assist with emptying her bladder for many years. In April, she was diagnosed with spina bifida/ tethered cord and underwent surgery.  She also underwent interstim implant - which is now off following the diagnosis of pregnancy.  The patient also reports a history of renal stones and previously underwent lithotripsy.    Hayley Calhoun also reports a history of multiple / recurrent pregnancy losses - all less that 8 weeks except for a prior loss At 20 weeks - she reports heavy bleeding at that time and presented to L&D and delivered shortly thereafter.  Her previous 2 term vaginal deliveries were associated with gestational hypertension and preeclampsia.  Hayley Calhoun underwent a thorough evaluation by REI that was unrevealing.  Hayley Calhoun is without complaints today.  She declined first trimester screen.  Obstetric History   G9   P2   T2   P0   A6   TAB0   SAB6   E0   M0   L2    Name of Baby 1 Not recorded  .  Outcome Date 05/15/05     GA 38w 0d        .  Delivery Type Vaginal, Spontaneous Delivery  .  Apgar at 1 min. Not recorded at 5 min. Not recorded  .  Living Yes  Name of Baby 2 Not recorded  .  Outcome Date 2008         GA 6w 0d         .  Delivery Type Not recorded  .  Apgar at 1 min. Not recorded at 5 min. Not recorded  .  Living Not recorded  Name of Baby 3 Not recorded  .  Outcome Date 2008         GA 6w 3d         .  Delivery Type Not recorded  .  Apgar at 1 min. Not recorded at 5 min.  Not recorded  .  Living Not recorded  Name of Baby 4 Not recorded  .  Outcome Date 2009         GA 20w 0d        .  Delivery Type Not recorded  .  Apgar at 1 min. Not recorded at 5 min. Not recorded  .  Living Not recorded  Name of Baby 5 Not recorded  .  Outcome Date 02/10/09     GA 40w 0d        .  Delivery Type Vaginal, Spontaneous Delivery  .  Apgar at 1 min. Not recorded at 5 min. Not recorded  .  Living Yes  Name of Baby 6 Not recorded  .  Outcome Date 2012         GA 5w 0d         .  Delivery Type Not recorded  .  Apgar at 1 min. Not recorded at 5 min. Not recorded  .  Living Not recorded  Name of Baby 7 Not recorded  .  Outcome Date 2013         GA 6w 0d         .  Delivery Type Not recorded  .  Apgar at 1 min. Not recorded at 5 min. Not recorded  .  Living Not recorded  Name of Baby 8 Not recorded  .  Outcome Date 04/2012      GA 6w 5d         .  Delivery Type Not recorded  .  Apgar at 1 min. Not recorded at 5 min. Not recorded  .  Living Not recorded  Name of Baby 9 Not recorded  .  Outcome Date Not recorded GA Not recorded  .  Delivery Type Not recorded  .  Apgar at 1 min. Not recorded at 5 min. Not recorded  .  Living Not recorded    PMH:  Past Medical History  Diagnosis Date  . Spina bifida   . Bladder disorder   . Kidney stone   . UTI (lower urinary tract infection)   . Kidney stones     PSH:  Past Surgical History  Procedure Laterality Date  . Spina bifida    . Lithotripsy    . Bladder botox    . Interstim implant placement    . Wisdom tooth extraction     Meds: Tums, Keflex ? ("red pill- antibiotic"), Colace, Diclegis, Zofran, Pepcid, Rubinol, Flintstone vitamins  Allergies:  Allergies  Allergen Reactions  . Sulfa Antibiotics Shortness Of Breath, Itching and Other (See Comments)    Itching and burning in chest; and could not breathe.  . Nitrofurantoin Monohyd Macro Nausea And Vomiting  . Penicillins Other (See Comments)    Childhood  reaction unknown   FH: Mother with scoliosis, denies family history of birth defects or hereditary disorders Soc:  History  Smoking status  . Never Smoker   Smokeless tobacco  . Never Used   History  Alcohol Use No    Review of Systems: no vaginal bleeding or cramping/contractions, no LOF, no nausea/vomiting. All other systems reviewed and are negative.  PE:  135#, 117/78,83  GEN: well-appearing female ABD: gravid, NT  Please see separate document for fetal ultrasound report.  A/P: 1) Single IUP at 12 6/7 weeks         2) Hx of tethered cord, urinary retention and multiple UTIs - the patient reports that she continues to require timed self catheterization.  She reports that she is on a prophylactic antibiotic (?Keflex) but is uncertain.  I recommend continued prophylactic antibiotics and frequent urine cultures- at least every trimester to screen for asymptomatic bacturia.  She will need to continue self cath for the foreseeable future - although this may become increasingly difficult particularly during the last trimester of pregnancy.  The patient declined first trimester screen - but would recommend MSAFP testing at 15-20 weeks to screen for spina bifida and a detailed ultrasound for fetal anatomy at 18 weeks (tentatively scheduled)         3) Hx of recurrent losses - has previously undergone an extensive work up.  Due to her previous 20 week loss, recommend cervical length surveillance beginning at 16 weeks - 24 weeks to screen for cervical insufficiency.     Thank you for the opportunity to be a part of the care of Hayley Calhoun. Please contact our office if we can be of further assistance.   I spent approximately 30 minutes with this patient with over 50%  of time spent in face-to-face counseling.  Alpha Gula, MD Maternal-Fetal Medicine

## 2013-01-13 NOTE — ED Notes (Signed)
Patient does have interstim in place. However, her urologist has turned it off since pregnancy.

## 2013-01-15 ENCOUNTER — Encounter: Payer: Self-pay | Admitting: Obstetrics & Gynecology

## 2013-01-15 ENCOUNTER — Ambulatory Visit (INDEPENDENT_AMBULATORY_CARE_PROVIDER_SITE_OTHER): Payer: Medicaid Other | Admitting: Obstetrics & Gynecology

## 2013-01-15 VITALS — BP 119/78 | Temp 98.8°F | Wt 134.4 lb

## 2013-01-15 DIAGNOSIS — Z8759 Personal history of other complications of pregnancy, childbirth and the puerperium: Secondary | ICD-10-CM

## 2013-01-15 DIAGNOSIS — Z23 Encounter for immunization: Secondary | ICD-10-CM

## 2013-01-15 DIAGNOSIS — O10019 Pre-existing essential hypertension complicating pregnancy, unspecified trimester: Secondary | ICD-10-CM

## 2013-01-15 DIAGNOSIS — Z8742 Personal history of other diseases of the female genital tract: Secondary | ICD-10-CM

## 2013-01-15 DIAGNOSIS — Q059 Spina bifida, unspecified: Secondary | ICD-10-CM

## 2013-01-15 DIAGNOSIS — O10912 Unspecified pre-existing hypertension complicating pregnancy, second trimester: Secondary | ICD-10-CM | POA: Insufficient documentation

## 2013-01-15 DIAGNOSIS — R339 Retention of urine, unspecified: Secondary | ICD-10-CM

## 2013-01-15 DIAGNOSIS — O0992 Supervision of high risk pregnancy, unspecified, second trimester: Secondary | ICD-10-CM

## 2013-01-15 LAB — POCT URINALYSIS DIP (DEVICE)
Bilirubin Urine: NEGATIVE
Glucose, UA: NEGATIVE mg/dL
Ketones, ur: NEGATIVE mg/dL
Nitrite: NEGATIVE
Protein, ur: NEGATIVE mg/dL
Specific Gravity, Urine: 1.02 (ref 1.005–1.030)

## 2013-01-15 LAB — COMPREHENSIVE METABOLIC PANEL
AST: 15 U/L (ref 0–37)
Alkaline Phosphatase: 34 U/L — ABNORMAL LOW (ref 39–117)
BUN: 4 mg/dL — ABNORMAL LOW (ref 6–23)
CO2: 22 mEq/L (ref 19–32)
Creat: 0.42 mg/dL — ABNORMAL LOW (ref 0.50–1.10)

## 2013-01-15 MED ORDER — NITROFURANTOIN MONOHYD MACRO 100 MG PO CAPS: 100.0000 mg | ORAL_CAPSULE | Freq: Two times a day (BID) | ORAL | Status: DC

## 2013-01-15 NOTE — Progress Notes (Signed)
Quad screen next visit.  Discussed flu shot.  Pt considering.  May come back next week for flu shot. Pt might be chronic hypertensive.  Pt takes home bp and is 140s/90s.  Will get 24 hour urine and baseline labs. Will place on Macrobid daily for suppression due to urinary retention and self catheterization.  Quad screen next visit.

## 2013-01-15 NOTE — Progress Notes (Signed)
P=86  Pt feels like she is about to come down with something; unable to sleep last night.

## 2013-01-16 ENCOUNTER — Encounter: Payer: Self-pay | Admitting: Family

## 2013-01-16 LAB — PROTEIN, URINE, 24 HOUR: Protein, Urine: 12 mg/dL

## 2013-01-16 LAB — CREATININE CLEARANCE, URINE, 24 HOUR
Creatinine Clearance: 222 mL/min — ABNORMAL HIGH (ref 75–115)
Creatinine, 24H Ur: 1341 mg/d (ref 700–1800)
Creatinine, Urine: 149 mg/dL
Creatinine: 0.42 mg/dL — ABNORMAL LOW (ref 0.50–1.10)

## 2013-01-19 ENCOUNTER — Encounter: Payer: Self-pay | Admitting: Obstetrics & Gynecology

## 2013-01-29 ENCOUNTER — Encounter: Payer: Self-pay | Admitting: Obstetrics & Gynecology

## 2013-01-29 ENCOUNTER — Ambulatory Visit (INDEPENDENT_AMBULATORY_CARE_PROVIDER_SITE_OTHER): Payer: Medicaid Other | Admitting: Obstetrics & Gynecology

## 2013-01-29 VITALS — BP 121/81 | Temp 97.8°F | Wt 133.8 lb

## 2013-01-29 DIAGNOSIS — O10019 Pre-existing essential hypertension complicating pregnancy, unspecified trimester: Secondary | ICD-10-CM

## 2013-01-29 DIAGNOSIS — O0992 Supervision of high risk pregnancy, unspecified, second trimester: Secondary | ICD-10-CM

## 2013-01-29 LAB — POCT URINALYSIS DIP (DEVICE)
Bilirubin Urine: NEGATIVE
Hgb urine dipstick: NEGATIVE
Ketones, ur: NEGATIVE mg/dL
Protein, ur: NEGATIVE mg/dL
Specific Gravity, Urine: 1.02 (ref 1.005–1.030)
pH: 7.5 (ref 5.0–8.0)

## 2013-01-29 NOTE — Patient Instructions (Signed)
Second Trimester of Pregnancy The second trimester is from week 13 through week 28, months 4 through 6. The second trimester is often a time when you feel your best. Your body has also adjusted to being pregnant, and you begin to feel better physically. Usually, morning sickness has lessened or quit completely, you may have more energy, and you may have an increase in appetite. The second trimester is also a time when the fetus is growing rapidly. At the end of the sixth month, the fetus is about 9 inches long and weighs about 1 pounds. You will likely begin to feel the baby move (quickening) between 18 and 20 weeks of the pregnancy. BODY CHANGES Your body goes through many changes during pregnancy. The changes vary from woman to woman.   Your weight will continue to increase. You will notice your lower abdomen bulging out.  You may begin to get stretch marks on your hips, abdomen, and breasts.  You may develop headaches that can be relieved by medicines approved by your caregiver.  You may urinate more often because the fetus is pressing on your bladder.  You may develop or continue to have heartburn as a result of your pregnancy.  You may develop constipation because certain hormones are causing the muscles that push waste through your intestines to slow down.  You may develop hemorrhoids or swollen, bulging veins (varicose veins).  You may have back pain because of the weight gain and pregnancy hormones relaxing your joints between the bones in your pelvis and as a result of a shift in weight and the muscles that support your balance.  Your breasts will continue to grow and be tender.  Your gums may bleed and may be sensitive to brushing and flossing.  Dark spots or blotches (chloasma, mask of pregnancy) may develop on your face. This will likely fade after the baby is born.  A dark line from your belly button to the pubic area (linea nigra) may appear. This will likely fade after the  baby is born. WHAT TO EXPECT AT YOUR PRENATAL VISITS During a routine prenatal visit:  You will be weighed to make sure you and the fetus are growing normally.  Your blood pressure will be taken.  Your abdomen will be measured to track your baby's growth.  The fetal heartbeat will be listened to.  Any test results from the previous visit will be discussed. Your caregiver may ask you:  How you are feeling.  If you are feeling the baby move.  If you have had any abnormal symptoms, such as leaking fluid, bleeding, severe headaches, or abdominal cramping.  If you have any questions. Other tests that may be performed during your second trimester include:  Blood tests that check for:  Low iron levels (anemia).  Gestational diabetes (between 24 and 28 weeks).  Rh antibodies.  Urine tests to check for infections, diabetes, or protein in the urine.  An ultrasound to confirm the proper growth and development of the baby.  An amniocentesis to check for possible genetic problems.  Fetal screens for spina bifida and Down syndrome. HOME CARE INSTRUCTIONS   Avoid all smoking, herbs, alcohol, and unprescribed drugs. These chemicals affect the formation and growth of the baby.  Follow your caregiver's instructions regarding medicine use. There are medicines that are either safe or unsafe to take during pregnancy.  Exercise only as directed by your caregiver. Experiencing uterine cramps is a good sign to stop exercising.  Continue to eat regular,   healthy meals.  Wear a good support bra for breast tenderness.  Do not use hot tubs, steam rooms, or saunas.  Wear your seat belt at all times when driving.  Avoid raw meat, uncooked cheese, cat litter boxes, and soil used by cats. These carry germs that can cause birth defects in the baby.  Take your prenatal vitamins.  Try taking a stool softener (if your caregiver approves) if you develop constipation. Eat more high-fiber foods,  such as fresh vegetables or fruit and whole grains. Drink plenty of fluids to keep your urine clear or pale yellow.  Take warm sitz baths to soothe any pain or discomfort caused by hemorrhoids. Use hemorrhoid cream if your caregiver approves.  If you develop varicose veins, wear support hose. Elevate your feet for 15 minutes, 3 4 times a day. Limit salt in your diet.  Avoid heavy lifting, wear low heel shoes, and practice good posture.  Rest with your legs elevated if you have leg cramps or low back pain.  Visit your dentist if you have not gone yet during your pregnancy. Use a soft toothbrush to brush your teeth and be gentle when you floss.  A sexual relationship may be continued unless your caregiver directs you otherwise.  Continue to go to all your prenatal visits as directed by your caregiver. SEEK MEDICAL CARE IF:   You have dizziness.  You have mild pelvic cramps, pelvic pressure, or nagging pain in the abdominal area.  You have persistent nausea, vomiting, or diarrhea.  You have a bad smelling vaginal discharge.  You have pain with urination. SEEK IMMEDIATE MEDICAL CARE IF:   You have a fever.  You are leaking fluid from your vagina.  You have spotting or bleeding from your vagina.  You have severe abdominal cramping or pain.  You have rapid weight gain or loss.  You have shortness of breath with chest pain.  You notice sudden or extreme swelling of your face, hands, ankles, feet, or legs.  You have not felt your baby move in over an hour.  You have severe headaches that do not go away with medicine.  You have vision changes. Document Released: 01/23/2001 Document Revised: 10/01/2012 Document Reviewed: 04/01/2012 [employer] Patient Information 2014 [employer].  

## 2013-01-29 NOTE — Progress Notes (Signed)
Normal first screen, will start surveillance for cx length soon. Quad screen today.

## 2013-01-29 NOTE — Progress Notes (Signed)
P= 79 Edema in feet.

## 2013-01-30 LAB — AFP, QUAD SCREEN
Age Alone: 1:990 {titer}
Curr Gest Age: 15 wks.days
Down Syndrome Scr Risk Est: 1:7460 {titer}
HCG, Total: 29115 m[IU]/mL
INH: 169.8 pg/mL
MoM for AFP: 0.72
MoM for INH: 0.8
MoM for hCG: 0.83
Open Spina bifida: NEGATIVE
Osb Risk: <1:54600 {titer}
Tri 18 Scr Risk Est: NEGATIVE
Trisomy 18 (Edward) Syndrome Interp.: 1:40600 {titer}
uE3 Value: 0.4 ng/mL

## 2013-02-04 ENCOUNTER — Other Ambulatory Visit: Payer: Self-pay | Admitting: Family Medicine

## 2013-02-04 DIAGNOSIS — Z8759 Personal history of other complications of pregnancy, childbirth and the puerperium: Secondary | ICD-10-CM

## 2013-02-10 ENCOUNTER — Other Ambulatory Visit: Payer: Self-pay | Admitting: Family Medicine

## 2013-02-10 ENCOUNTER — Ambulatory Visit (HOSPITAL_COMMUNITY)
Admission: RE | Admit: 2013-02-10 | Discharge: 2013-02-10 | Disposition: A | Discharge status: Home / Self Care | Payer: Medicaid Other | Source: Ambulatory Visit | Attending: Family | Admitting: Family

## 2013-02-10 DIAGNOSIS — Z8759 Personal history of other complications of pregnancy, childbirth and the puerperium: Secondary | ICD-10-CM

## 2013-02-10 DIAGNOSIS — O2620 Pregnancy care for patient with recurrent pregnancy loss, unspecified trimester: Secondary | ICD-10-CM | POA: Insufficient documentation

## 2013-02-10 DIAGNOSIS — O09299 Supervision of pregnancy with other poor reproductive or obstetric history, unspecified trimester: Secondary | ICD-10-CM | POA: Insufficient documentation

## 2013-02-10 DIAGNOSIS — O352XX0 Maternal care for (suspected) hereditary disease in fetus, not applicable or unspecified: Secondary | ICD-10-CM | POA: Insufficient documentation

## 2013-02-12 NOTE — L&D Delivery Note (Signed)
Delivery Note At 11:30 PM a viable female was delivered via Vaginal, Spontaneous Delivery (Presentation: ; Occiput Anterior).  APGAR: 8, 9; weight:pending .   Placenta status: Intact, Spontaneous.  Cord: 3 vessels with the following complications: None.  Cord pH: n/a  Anesthesia:   Episiotomy: None Lacerations: None Suture Repair: n/a Est. Blood Loss (mL): 200  Mom to postpartum.  Baby to Couplet care / Skin to Skin.  Pt pushed with good maternal effort to deliver a liveborn female via NSVD with spontaneous cry.   Baby placed on maternal abdomen.  Delayed cord clamping performed.  Cord cut by FOB.  Placenta delivered intact with 3V cord via traction and pitocin.  No tear. No complications.  Mom and baby to postpartum.   Rosemarie Ax 07/13/2013, 11:42 PM  I was present for the delivery of this patient and agree with resident's note and plan of care.  Fredrik Rigger, MD OB Fellow 07/15/2013 7:56 AM

## 2013-02-19 ENCOUNTER — Other Ambulatory Visit: Payer: Self-pay | Admitting: Family Medicine

## 2013-02-19 DIAGNOSIS — O10919 Unspecified pre-existing hypertension complicating pregnancy, unspecified trimester: Secondary | ICD-10-CM

## 2013-02-20 ENCOUNTER — Emergency Department (INDEPENDENT_AMBULATORY_CARE_PROVIDER_SITE_OTHER)
Admission: EM | Admit: 2013-02-20 | Discharge: 2013-02-20 | Disposition: A | Discharge status: Home / Self Care | Payer: Medicaid Other | Source: Home / Self Care

## 2013-02-20 ENCOUNTER — Encounter (HOSPITAL_COMMUNITY): Payer: Self-pay | Admitting: Emergency Medicine

## 2013-02-20 DIAGNOSIS — J069 Acute upper respiratory infection, unspecified: Secondary | ICD-10-CM

## 2013-02-20 DIAGNOSIS — J019 Acute sinusitis, unspecified: Secondary | ICD-10-CM

## 2013-02-20 HISTORY — DX: Encounter for supervision of normal pregnancy, unspecified, unspecified trimester: Z34.90

## 2013-02-20 MED ORDER — IPRATROPIUM BROMIDE 0.06 % NA SOLN: 2.0000 | Freq: Four times a day (QID) | NASAL | Status: DC

## 2013-02-20 NOTE — ED Notes (Signed)
C/o URI type symptoms

## 2013-02-20 NOTE — ED Provider Notes (Signed)
CSN: [id number]     Arrival date & time 02/20/13  0801 History   First MD Initiated Contact with Patient 02/20/13 [phone]     Chief Complaint  Patient presents with  . URI   (Consider location/radiation/quality/duration/timing/severity/associated sxs/prior Treatment) HPI Comments: 28 year old female in her second trimester is complaining of chest congestion, nasal stuffiness, bodyaches ear congestion and malaise for 3 days.   Past Medical History  Diagnosis Date  . Spina bifida   . Bladder disorder   . Kidney stone   . UTI (lower urinary tract infection)   . Kidney stones   . Pregnant    Past Surgical History  Procedure Laterality Date  . Spina bifida    . Lithotripsy    . Bladder botox    . Interstim implant placement    . Wisdom tooth extraction     Family History  Problem Relation Age of Onset  . Other Neg Hx   . Scoliosis Mother    History  Substance Use Topics  . Smoking status: Never Smoker   . Smokeless tobacco: Never Used  . Alcohol Use: No   OB History   Grav Para Term Preterm Abortions TAB SAB Ect Mult Living   9 2 2  0 6 0 6 0 0 2     Review of Systems  Constitutional: Negative for fever, chills, activity change, appetite change and fatigue.  HENT: Positive for congestion, postnasal drip and rhinorrhea. Negative for facial swelling.   Eyes: Negative.   Respiratory: Positive for cough. Negative for shortness of breath and wheezing.   Cardiovascular: Negative.   Genitourinary: Negative.   Musculoskeletal: Negative for neck pain and neck stiffness.  Skin: Negative for pallor and rash.  Neurological: Negative.     Allergies  Sulfa antibiotics; Nitrofurantoin monohyd macro; and Penicillins  Home Medications   Current Outpatient Rx  Name  Route  Sig  Dispense  Refill  . calcium carbonate (TUMS - DOSED IN MG ELEMENTAL CALCIUM) 500 MG chewable tablet   Oral   Chew 2 tablets by mouth 2 (two) times daily as needed for heartburn.         . docusate  sodium (COLACE) 100 MG capsule   Oral   Take 1 capsule (100 mg total) by mouth every 12 (twelve) hours.   60 capsule   0   . famotidine (PEPCID) 20 MG tablet   Oral   Take 1 tablet (20 mg total) by mouth 2 (two) times daily.   30 tablet   0   . flintstones complete (FLINTSTONES) 60 MG chewable tablet   Oral   Chew 2 tablets by mouth daily.         Marland Kitchen ipratropium (ATROVENT) 0.06 % nasal spray   Each Nare   Place 2 sprays into both nostrils 4 (four) times daily.   15 mL   12   . ondansetron (ZOFRAN) 4 MG tablet   Oral   Take 1 tablet (4 mg total) by mouth every 8 (eight) hours as needed for nausea.   20 tablet   0   . polyethylene glycol powder (GLYCOLAX/MIRALAX) powder   Oral   Take 17 g by mouth daily.   255 g   0    BP 123/78  Pulse 88  Temp(Src) 98.1 F (36.7 C) (Oral)  Resp 16  SpO2 100%  LMP 10/15/2012 Physical Exam  Nursing note and vitals reviewed. Constitutional: She is oriented to person, place, and time. She appears well-developed and well-nourished.  No distress.  HENT:  Mouth/Throat: No oropharyngeal exudate.  Bilateral TMs are normal Oropharynx with minor erythema and copious amount of clear to opaque  PND  Eyes: Conjunctivae and EOM are normal.  Neck: Normal range of motion. Neck supple.  Cardiovascular: Normal rate, regular rhythm and normal heart sounds.   Pulmonary/Chest: Effort normal and breath sounds normal. No respiratory distress. She has no wheezes. She has no rales.  Abdominal: Soft. There is no tenderness.  Musculoskeletal: Normal range of motion. She exhibits no edema.  Lymphadenopathy:    She has no cervical adenopathy.  Neurological: She is alert and oriented to person, place, and time.  Skin: Skin is warm and dry. No rash noted.  Psychiatric: She has a normal mood and affect.    ED Course  Procedures (including critical care time) Labs Review Labs Reviewed - No data to display Imaging Review No results found.    MDM    1. URI (upper respiratory infection)   2. Acute rhinosinusitis      Atrovent nasal spray as directed Copious amount of nasal saline Low dose Benadryl at nighttime for severe drainage Recommend no decongestants Drink plenty of fluids stay well hydrated For worsening, fevers recheck promptly.    Janne Napoleon, NP 02/20/13 [phone]

## 2013-02-20 NOTE — Discharge Instructions (Signed)
Upper Respiratory Infection, Adult Copious amounts or nasal saline An upper respiratory infection (URI) is also sometimes known as the common cold. The upper respiratory tract includes the nose, sinuses, throat, trachea, and bronchi. Bronchi are the airways leading to the lungs. Most people improve within 1 week, but symptoms can last up to 2 weeks. A residual cough may last even longer.  CAUSES Many different viruses can infect the tissues lining the upper respiratory tract. The tissues become irritated and inflamed and often become very moist. Mucus production is also common. A cold is contagious. You can easily spread the virus to others by oral contact. This includes kissing, sharing a glass, coughing, or sneezing. Touching your mouth or nose and then touching a surface, which is then touched by another person, can also spread the virus. SYMPTOMS  Symptoms typically develop 1 to 3 days after you come in contact with a cold virus. Symptoms vary from person to person. They may include:  Runny nose.  Sneezing.  Nasal congestion.  Sinus irritation.  Sore throat.  Loss of voice (laryngitis).  Cough.  Fatigue.  Muscle aches.  Loss of appetite.  Headache.  Low-grade fever. DIAGNOSIS  You might diagnose your own cold based on familiar symptoms, since most people get a cold 2 to 3 times a year. Your caregiver can confirm this based on your exam. Most importantly, your caregiver can check that your symptoms are not due to another disease such as strep throat, sinusitis, pneumonia, asthma, or epiglottitis. Blood tests, throat tests, and X-rays are not necessary to diagnose a common cold, but they may sometimes be helpful in excluding other more serious diseases. Your caregiver will decide if any further tests are required. RISKS AND COMPLICATIONS  You may be at risk for a more severe case of the common cold if you smoke cigarettes, have chronic heart disease (such as heart failure) or lung  disease (such as asthma), or if you have a weakened immune system. The very young and very old are also at risk for more serious infections. Bacterial sinusitis, middle ear infections, and bacterial pneumonia can complicate the common cold. The common cold can worsen asthma and chronic obstructive pulmonary disease (COPD). Sometimes, these complications can require emergency medical care and may be life-threatening. PREVENTION  The best way to protect against getting a cold is to practice good hygiene. Avoid oral or hand contact with people with cold symptoms. Wash your hands often if contact occurs. There is no clear evidence that vitamin C, vitamin E, echinacea, or exercise reduces the chance of developing a cold. However, it is always recommended to get plenty of rest and practice good nutrition. TREATMENT  Treatment is directed at relieving symptoms. There is no cure. Antibiotics are not effective, because the infection is caused by a virus, not by bacteria. Treatment may include:  Increased fluid intake. Sports drinks offer valuable electrolytes, sugars, and fluids.  Breathing heated mist or steam (vaporizer or shower).  Eating chicken soup or other clear broths, and maintaining good nutrition.  Getting plenty of rest.  Using gargles or lozenges for comfort.  Controlling fevers with ibuprofen or acetaminophen as directed by your caregiver.  Increasing usage of your inhaler if you have asthma. Zinc gel and zinc lozenges, taken in the first 24 hours of the common cold, can shorten the duration and lessen the severity of symptoms. Pain medicines may help with fever, muscle aches, and throat pain. A variety of non-prescription medicines are available to treat congestion  and runny nose. Your caregiver can make recommendations and may suggest nasal or lung inhalers for other symptoms.  HOME CARE INSTRUCTIONS   Only take over-the-counter or prescription medicines for pain, discomfort, or fever as  directed by your caregiver.  Use a warm mist humidifier or inhale steam from a shower to increase air moisture. This may keep secretions moist and make it easier to breathe.  Drink enough water and fluids to keep your urine clear or pale yellow.  Rest as needed.  Return to work when your temperature has returned to normal or as your caregiver advises. You may need to stay home longer to avoid infecting others. You can also use a face mask and careful hand washing to prevent spread of the virus. SEEK MEDICAL CARE IF:   After the first few days, you feel you are getting worse rather than better.  You need your caregiver's advice about medicines to control symptoms.  You develop chills, worsening shortness of breath, or brown or red sputum. These may be signs of pneumonia.  You develop yellow or brown nasal discharge or pain in the face, especially when you bend forward. These may be signs of sinusitis.  You develop a fever, swollen neck glands, pain with swallowing, or white areas in the back of your throat. These may be signs of strep throat. SEEK IMMEDIATE MEDICAL CARE IF:   You have a fever.  You develop severe or persistent headache, ear pain, sinus pain, or chest pain.  You develop wheezing, a prolonged cough, cough up blood, or have a change in your usual mucus (if you have chronic lung disease).  You develop sore muscles or a stiff neck. Document Released: 07/25/2000 Document Revised: 04/23/2011 Document Reviewed: 06/02/2010 [facility] Patient Information 2014 [place], [place].  Sinusitis Sinusitis is redness, soreness, and puffiness (inflammation) of the air pockets in the bones of your face (sinuses). The redness, soreness, and puffiness can cause air and mucus to get trapped in your sinuses. This can allow germs to grow and cause an infection.  HOME CARE   Drink enough fluids to keep your pee (urine) clear or pale yellow.  Use a humidifier in your home.  Run a hot shower  to create steam in the bathroom. Sit in the bathroom with the door closed. Breathe in the steam 3 4 times a day.  Put a warm, moist washcloth on your face 3 4 times a day, or as told by your doctor.  Use salt water sprays (saline sprays) to wet the thick fluid in your nose. This can help the sinuses drain.  Only take medicine as told by your doctor. GET HELP RIGHT AWAY IF:   Your pain gets worse.  You have very bad headaches.  You are sick to your stomach (nauseous).  You throw up (vomit).  You are very sleepy (drowsy) all the time.  Your face is puffy (swollen).  Your vision changes.  You have a stiff neck.  You have trouble breathing. MAKE SURE YOU:   Understand these instructions.  Will watch your condition.  Will get help right away if you are not doing well or get worse. Document Released: 07/18/2007 Document Revised: 10/24/2011 Document Reviewed: 09/04/2011 [facility] Patient Information 2014 [place]. Is and

## 2013-02-23 NOTE — ED Provider Notes (Signed)
Medical screening examination/treatment/procedure(s) were performed by resident physician or non-physician practitioner and as supervising physician I was immediately available for consultation/collaboration.   Jujhar Everett DOUGLAS MD.   Dura Mccormack D Aveena Bari, MD 02/23/13 [id number] 

## 2013-02-24 ENCOUNTER — Other Ambulatory Visit: Payer: Self-pay | Admitting: Family Medicine

## 2013-02-24 ENCOUNTER — Ambulatory Visit (HOSPITAL_COMMUNITY)
Admission: RE | Admit: 2013-02-24 | Discharge: 2013-02-24 | Disposition: A | Discharge status: Home / Self Care | Payer: Medicaid Other | Source: Ambulatory Visit | Attending: Family Medicine | Admitting: Family Medicine

## 2013-02-24 DIAGNOSIS — O09299 Supervision of pregnancy with other poor reproductive or obstetric history, unspecified trimester: Secondary | ICD-10-CM | POA: Insufficient documentation

## 2013-02-24 DIAGNOSIS — Z8759 Personal history of other complications of pregnancy, childbirth and the puerperium: Secondary | ICD-10-CM

## 2013-02-24 DIAGNOSIS — O352XX0 Maternal care for (suspected) hereditary disease in fetus, not applicable or unspecified: Secondary | ICD-10-CM | POA: Insufficient documentation

## 2013-02-24 DIAGNOSIS — O2620 Pregnancy care for patient with recurrent pregnancy loss, unspecified trimester: Secondary | ICD-10-CM | POA: Insufficient documentation

## 2013-02-24 DIAGNOSIS — O358XX0 Maternal care for other (suspected) fetal abnormality and damage, not applicable or unspecified: Secondary | ICD-10-CM | POA: Insufficient documentation

## 2013-02-24 DIAGNOSIS — Z3689 Encounter for other specified antenatal screening: Secondary | ICD-10-CM

## 2013-02-24 DIAGNOSIS — O10919 Unspecified pre-existing hypertension complicating pregnancy, unspecified trimester: Secondary | ICD-10-CM

## 2013-02-24 DIAGNOSIS — Z363 Encounter for antenatal screening for malformations: Secondary | ICD-10-CM | POA: Insufficient documentation

## 2013-02-24 DIAGNOSIS — Z1389 Encounter for screening for other disorder: Secondary | ICD-10-CM | POA: Insufficient documentation

## 2013-02-26 ENCOUNTER — Encounter: Payer: Self-pay | Admitting: Obstetrics & Gynecology

## 2013-02-26 ENCOUNTER — Encounter: Payer: Medicaid Other | Admitting: Family Medicine

## 2013-03-04 ENCOUNTER — Encounter: Payer: Self-pay | Admitting: *Deleted

## 2013-03-04 LAB — US OB LIMITED

## 2013-03-05 ENCOUNTER — Encounter: Payer: Self-pay | Admitting: Family Medicine

## 2013-03-05 ENCOUNTER — Encounter: Payer: Self-pay | Admitting: Obstetrics & Gynecology

## 2013-03-05 ENCOUNTER — Ambulatory Visit (INDEPENDENT_AMBULATORY_CARE_PROVIDER_SITE_OTHER): Payer: Medicaid Other | Admitting: Obstetrics & Gynecology

## 2013-03-05 VITALS — BP 123/73 | Temp 98.3°F | Wt 139.0 lb

## 2013-03-05 DIAGNOSIS — Q059 Spina bifida, unspecified: Secondary | ICD-10-CM

## 2013-03-05 DIAGNOSIS — O10912 Unspecified pre-existing hypertension complicating pregnancy, second trimester: Secondary | ICD-10-CM

## 2013-03-05 DIAGNOSIS — Z8742 Personal history of other diseases of the female genital tract: Secondary | ICD-10-CM

## 2013-03-05 DIAGNOSIS — O10019 Pre-existing essential hypertension complicating pregnancy, unspecified trimester: Secondary | ICD-10-CM

## 2013-03-05 DIAGNOSIS — Z8759 Personal history of other complications of pregnancy, childbirth and the puerperium: Secondary | ICD-10-CM

## 2013-03-05 LAB — POCT URINALYSIS DIP (DEVICE)
BILIRUBIN URINE: NEGATIVE
Glucose, UA: NEGATIVE mg/dL
Hgb urine dipstick: NEGATIVE
Ketones, ur: NEGATIVE mg/dL
NITRITE: NEGATIVE
Protein, ur: NEGATIVE mg/dL
Specific Gravity, Urine: 1.025 (ref 1.005–1.030)
Urobilinogen, UA: 1 mg/dL (ref 0.0–1.0)
pH: 6.5 (ref 5.0–8.0)

## 2013-03-05 NOTE — Patient Instructions (Signed)
Return to clinic for any obstetric concerns or go to [facility] for evaluation  

## 2013-03-05 NOTE — Progress Notes (Signed)
Normal quad screen.  Scheduled for next scan for cervical length surveillance on 03/10/13; scan on 02/24/13 showed 4 cm cervical length.  No other complaints or concerns.  Routine obstetric precautions reviewed.

## 2013-03-05 NOTE — Progress Notes (Signed)
P= 85 Occasional edema in feet.

## 2013-03-06 ENCOUNTER — Other Ambulatory Visit: Payer: Self-pay | Admitting: Family Medicine

## 2013-03-06 DIAGNOSIS — O169 Unspecified maternal hypertension, unspecified trimester: Secondary | ICD-10-CM

## 2013-03-06 DIAGNOSIS — O0990 Supervision of high risk pregnancy, unspecified, unspecified trimester: Secondary | ICD-10-CM

## 2013-03-10 ENCOUNTER — Telehealth: Payer: Self-pay

## 2013-03-10 ENCOUNTER — Ambulatory Visit (HOSPITAL_COMMUNITY)
Admission: RE | Admit: 2013-03-10 | Discharge: 2013-03-10 | Disposition: A | Discharge status: Home / Self Care | Payer: Medicaid Other | Source: Ambulatory Visit | Attending: Family Medicine | Admitting: Family Medicine

## 2013-03-10 ENCOUNTER — Ambulatory Visit (HOSPITAL_COMMUNITY): Payer: Medicaid Other

## 2013-03-10 VITALS — BP 117/65 | HR 80 | Wt 140.0 lb

## 2013-03-10 DIAGNOSIS — R339 Retention of urine, unspecified: Secondary | ICD-10-CM

## 2013-03-10 DIAGNOSIS — O09299 Supervision of pregnancy with other poor reproductive or obstetric history, unspecified trimester: Secondary | ICD-10-CM | POA: Insufficient documentation

## 2013-03-10 DIAGNOSIS — O169 Unspecified maternal hypertension, unspecified trimester: Secondary | ICD-10-CM

## 2013-03-10 DIAGNOSIS — O352XX0 Maternal care for (suspected) hereditary disease in fetus, not applicable or unspecified: Secondary | ICD-10-CM | POA: Insufficient documentation

## 2013-03-10 DIAGNOSIS — O0990 Supervision of high risk pregnancy, unspecified, unspecified trimester: Secondary | ICD-10-CM

## 2013-03-10 DIAGNOSIS — O2620 Pregnancy care for patient with recurrent pregnancy loss, unspecified trimester: Secondary | ICD-10-CM | POA: Insufficient documentation

## 2013-03-10 MED ORDER — CEPHALEXIN 250 MG PO CAPS: 250.0000 mg | ORAL_CAPSULE | Freq: Every day | ORAL | Status: DC

## 2013-03-10 NOTE — Telephone Encounter (Signed)
Spoke to Dr. Ihor Dow who stated pt. Can be switched to keflex 250mg  daily, prescribed 30 capsules with 1 refill and then pt. Can re-evaluate the plan at next visit. Called pt. And informed her that this prescription is available for her at her [facility] pharmacy. Advised pt. NOT to take Cipro as it is contraindicated in pregnancy. Pt. Verbalized understanding and gratitude and had no further questions or concerns.

## 2013-03-10 NOTE — Telephone Encounter (Addendum)
Pt. Called stating she is calling with questions about a medication for her bladder.   Called pt. And patient stated she was put on Macrobid a month ago but they make her nauseas and she is having trouble keeping them down. States the provider had switched her from Keflex to [facility] as they said it would be better for longevity but that if the Macrobid does not work they can switch her back.  Asked pt. If she was taking the medication with food or on a full stomach, she stated she was. She also stated she has tried taking Zofran before taking the medication and states "it still doesn't agree with me. I told the doctor when she prescribed it that my urologist had put me on it before and it didn't agree with me then either." Pt. States she found her bottle of red pills that she would like to go back on and they are Cipro, which her urologist also had her on. Informed pt. I would speak to one of the providers and call her back.

## 2013-03-24 ENCOUNTER — Ambulatory Visit (HOSPITAL_COMMUNITY): Payer: Medicaid Other

## 2013-03-24 ENCOUNTER — Other Ambulatory Visit: Payer: Self-pay | Admitting: Family Medicine

## 2013-03-24 DIAGNOSIS — O2620 Pregnancy care for patient with recurrent pregnancy loss, unspecified trimester: Secondary | ICD-10-CM

## 2013-03-24 DIAGNOSIS — O09299 Supervision of pregnancy with other poor reproductive or obstetric history, unspecified trimester: Secondary | ICD-10-CM

## 2013-03-24 DIAGNOSIS — O352XX0 Maternal care for (suspected) hereditary disease in fetus, not applicable or unspecified: Secondary | ICD-10-CM

## 2013-03-26 ENCOUNTER — Ambulatory Visit (HOSPITAL_COMMUNITY)
Admission: RE | Admit: 2013-03-26 | Discharge: 2013-03-26 | Disposition: A | Discharge status: Home / Self Care | Payer: Medicaid Other | Source: Ambulatory Visit | Attending: Family Medicine | Admitting: Family Medicine

## 2013-03-26 VITALS — BP 138/87 | HR 89 | Wt 145.5 lb

## 2013-03-26 DIAGNOSIS — O352XX0 Maternal care for (suspected) hereditary disease in fetus, not applicable or unspecified: Secondary | ICD-10-CM | POA: Insufficient documentation

## 2013-03-26 DIAGNOSIS — O09299 Supervision of pregnancy with other poor reproductive or obstetric history, unspecified trimester: Secondary | ICD-10-CM | POA: Insufficient documentation

## 2013-03-26 DIAGNOSIS — O2620 Pregnancy care for patient with recurrent pregnancy loss, unspecified trimester: Secondary | ICD-10-CM | POA: Insufficient documentation

## 2013-04-02 ENCOUNTER — Encounter: Payer: Self-pay | Admitting: Family Medicine

## 2013-04-02 ENCOUNTER — Ambulatory Visit (INDEPENDENT_AMBULATORY_CARE_PROVIDER_SITE_OTHER): Payer: Medicaid Other | Admitting: Family Medicine

## 2013-04-02 VITALS — BP 115/75 | Temp 97.9°F | Wt 142.8 lb

## 2013-04-02 DIAGNOSIS — O10019 Pre-existing essential hypertension complicating pregnancy, unspecified trimester: Secondary | ICD-10-CM

## 2013-04-02 DIAGNOSIS — O10912 Unspecified pre-existing hypertension complicating pregnancy, second trimester: Secondary | ICD-10-CM

## 2013-04-02 DIAGNOSIS — O0990 Supervision of high risk pregnancy, unspecified, unspecified trimester: Secondary | ICD-10-CM

## 2013-04-02 LAB — POCT URINALYSIS DIP (DEVICE)
Bilirubin Urine: NEGATIVE
Glucose, UA: NEGATIVE mg/dL
Hgb urine dipstick: NEGATIVE
Ketones, ur: NEGATIVE mg/dL
Nitrite: NEGATIVE
PH: 7 (ref 5.0–8.0)
PROTEIN: NEGATIVE mg/dL
SPECIFIC GRAVITY, URINE: 1.02 (ref 1.005–1.030)
UROBILINOGEN UA: 4 mg/dL — AB (ref 0.0–1.0)

## 2013-04-02 NOTE — Progress Notes (Signed)
Doing well Last cervical length was 4.2 cm

## 2013-04-02 NOTE — Progress Notes (Signed)
Pulse: 89

## 2013-04-02 NOTE — Patient Instructions (Signed)
Second Trimester of Pregnancy The second trimester is from week 13 through week 28, months 4 through 6. The second trimester is often a time when you feel your best. Your body has also adjusted to being pregnant, and you begin to feel better physically. Usually, morning sickness has lessened or quit completely, you may have more energy, and you may have an increase in appetite. The second trimester is also a time when the fetus is growing rapidly. At the end of the sixth month, the fetus is about 9 inches long and weighs about 1 pounds. You will likely begin to feel the baby move (quickening) between 18 and 20 weeks of the pregnancy. BODY CHANGES Your body goes through many changes during pregnancy. The changes vary from woman to woman.   Your weight will continue to increase. You will notice your lower abdomen bulging out.  You may begin to get stretch marks on your hips, abdomen, and breasts.  You may develop headaches that can be relieved by medicines approved by your caregiver.  You may urinate more often because the fetus is pressing on your bladder.  You may develop or continue to have heartburn as a result of your pregnancy.  You may develop constipation because certain hormones are causing the muscles that push waste through your intestines to slow down.  You may develop hemorrhoids or swollen, bulging veins (varicose veins).  You may have back pain because of the weight gain and pregnancy hormones relaxing your joints between the bones in your pelvis and as a result of a shift in weight and the muscles that support your balance.  Your breasts will continue to grow and be tender.  Your gums may bleed and may be sensitive to brushing and flossing.  Dark spots or blotches (chloasma, mask of pregnancy) may develop on your face. This will likely fade after the baby is born.  A dark line from your belly button to the pubic area (linea nigra) may appear. This will likely fade after  the baby is born. WHAT TO EXPECT AT YOUR PRENATAL VISITS During a routine prenatal visit:  You will be weighed to make sure you and the fetus are growing normally.  Your blood pressure will be taken.  Your abdomen will be measured to track your baby's growth.  The fetal heartbeat will be listened to.  Any test results from the previous visit will be discussed. Your caregiver may ask you:  How you are feeling.  If you are feeling the baby move.  If you have had any abnormal symptoms, such as leaking fluid, bleeding, severe headaches, or abdominal cramping.  If you have any questions. Other tests that may be performed during your second trimester include:  Blood tests that check for:  Low iron levels (anemia).  Gestational diabetes (between 24 and 28 weeks).  Rh antibodies.  Urine tests to check for infections, diabetes, or protein in the urine.  An ultrasound to confirm the proper growth and development of the baby.  An amniocentesis to check for possible genetic problems.  Fetal screens for spina bifida and Down syndrome. HOME CARE INSTRUCTIONS   Avoid all smoking, herbs, alcohol, and unprescribed drugs. These chemicals affect the formation and growth of the baby.  Follow your caregiver's instructions regarding medicine use. There are medicines that are either safe or unsafe to take during pregnancy.  Exercise only as directed by your caregiver. Experiencing uterine cramps is a good sign to stop exercising.  Continue to eat regular,  healthy meals.  Wear a good support bra for breast tenderness.  Do not use hot tubs, steam rooms, or saunas.  Wear your seat belt at all times when driving.  Avoid raw meat, uncooked cheese, cat litter boxes, and soil used by cats. These carry germs that can cause birth defects in the baby.  Take your prenatal vitamins.  Try taking a stool softener (if your caregiver approves) if you develop constipation. Eat more high-fiber  foods, such as fresh vegetables or fruit and whole grains. Drink plenty of fluids to keep your urine clear or pale yellow.  Take warm sitz baths to soothe any pain or discomfort caused by hemorrhoids. Use hemorrhoid cream if your caregiver approves.  If you develop varicose veins, wear support hose. Elevate your feet for 15 minutes, 3 4 times a day. Limit salt in your diet.  Avoid heavy lifting, wear low heel shoes, and practice good posture.  Rest with your legs elevated if you have leg cramps or low back pain.  Visit your dentist if you have not gone yet during your pregnancy. Use a soft toothbrush to brush your teeth and be gentle when you floss.  A sexual relationship may be continued unless your caregiver directs you otherwise.  Continue to go to all your prenatal visits as directed by your caregiver. SEEK MEDICAL CARE IF:   You have dizziness.  You have mild pelvic cramps, pelvic pressure, or nagging pain in the abdominal area.  You have persistent nausea, vomiting, or diarrhea.  You have a bad smelling vaginal discharge.  You have pain with urination. SEEK IMMEDIATE MEDICAL CARE IF:   You have a fever.  You are leaking fluid from your vagina.  You have spotting or bleeding from your vagina.  You have severe abdominal cramping or pain.  You have rapid weight gain or loss.  You have shortness of breath with chest pain.  You notice sudden or extreme swelling of your face, hands, ankles, feet, or legs.  You have not felt your baby move in over an hour.  You have severe headaches that do not go away with medicine.  You have vision changes. Document Released: 01/23/2001 Document Revised: 10/01/2012 Document Reviewed: 04/01/2012 [facility] Patient Information 2014 [place].  Breastfeeding Deciding to breastfeed is one of the best choices you can make for you and your baby. A change in hormones during pregnancy causes your breast tissue to grow and increases the  number and size of your milk ducts. These hormones also allow proteins, sugars, and fats from your blood supply to make breast milk in your milk-producing glands. Hormones prevent breast milk from being released before your baby is born as well as prompt milk flow after birth. Once breastfeeding has begun, thoughts of your baby, as well as his or her sucking or crying, can stimulate the release of milk from your milk-producing glands.  BENEFITS OF BREASTFEEDING For Your Baby  Your first milk (colostrum) helps your baby's digestive system function better.   There are antibodies in your milk that help your baby fight off infections.   Your baby has a lower incidence of asthma, allergies, and sudden infant death syndrome.   The nutrients in breast milk are better for your baby than infant formulas and are designed uniquely for your baby's needs.   Breast milk improves your baby's brain development.   Your baby is less likely to develop other conditions, such as childhood obesity, asthma, or type 2 diabetes mellitus.  For  You   Breastfeeding helps to create a very special bond between you and your baby.   Breastfeeding is convenient. Breast milk is always available at the correct temperature and costs nothing.   Breastfeeding helps to burn calories and helps you lose the weight gained during pregnancy.   Breastfeeding makes your uterus contract to its prepregnancy size faster and slows bleeding (lochia) after you give birth.   Breastfeeding helps to lower your risk of developing type 2 diabetes mellitus, osteoporosis, and breast or ovarian cancer later in life. SIGNS THAT YOUR BABY IS HUNGRY Early Signs of Hunger  Increased alertness or activity.  Stretching.  Movement of the head from side to side.  Movement of the head and opening of the mouth when the corner of the mouth or cheek is stroked (rooting).  Increased sucking sounds, smacking lips, cooing, sighing, or  squeaking.  Hand-to-mouth movements.  Increased sucking of fingers or hands. Late Signs of Hunger  Fussing.  Intermittent crying. Extreme Signs of Hunger Signs of extreme hunger will require calming and consoling before your baby will be able to breastfeed successfully. Do not wait for the following signs of extreme hunger to occur before you initiate breastfeeding:   Restlessness.  A loud, strong cry.   Screaming. BREASTFEEDING BASICS Breastfeeding Initiation  Find a comfortable place to sit or lie down, with your neck and back well supported.  Place a pillow or rolled up blanket under your baby to bring him or her to the level of your breast (if you are seated). Nursing pillows are specially designed to help support your arms and your baby while you breastfeed.  Make sure that your baby's abdomen is facing your abdomen.   Gently massage your breast. With your fingertips, massage from your chest wall toward your nipple in a circular motion. This encourages milk flow. You may need to continue this action during the feeding if your milk flows slowly.  Support your breast with 4 fingers underneath and your thumb above your nipple. Make sure your fingers are well away from your nipple and your baby's mouth.   Stroke your baby's lips gently with your finger or nipple.   When your baby's mouth is open wide enough, quickly bring your baby to your breast, placing your entire nipple and as much of the colored area around your nipple (areola) as possible into your baby's mouth.   More areola should be visible above your baby's upper lip than below the lower lip.   Your baby's tongue should be between his or her lower gum and your breast.   Ensure that your baby's mouth is correctly positioned around your nipple (latched). Your baby's lips should create a seal on your breast and be turned out (everted).  It is common for your baby to suck about 2 3 minutes in order to start the  flow of breast milk. Latching Teaching your baby how to latch on to your breast properly is very important. An improper latch can cause nipple pain and decreased milk supply for you and poor weight gain in your baby. Also, if your baby is not latched onto your nipple properly, he or she may swallow some air during feeding. This can make your baby fussy. Burping your baby when you switch breasts during the feeding can help to get rid of the air. However, teaching your baby to latch on properly is still the best way to prevent fussiness from swallowing air while breastfeeding. Signs that your baby has  successfully latched on to your nipple:    Silent tugging or silent sucking, without causing you pain.   Swallowing heard between every 3 4 sucks.    Muscle movement above and in front of his or her ears while sucking.  Signs that your baby has not successfully latched on to nipple:   Sucking sounds or smacking sounds from your baby while breastfeeding.  Nipple pain. If you think your baby has not latched on correctly, slip your finger into the corner of your baby's mouth to break the suction and place it between your baby's gums. Attempt breastfeeding initiation again. Signs of Successful Breastfeeding Signs from your baby:   A gradual decrease in the number of sucks or complete cessation of sucking.   Falling asleep.   Relaxation of his or her body.   Retention of a small amount of milk in his or her mouth.   Letting go of your breast by himself or herself. Signs from you:  Breasts that have increased in firmness, weight, and size 1 3 hours after feeding.   Breasts that are softer immediately after breastfeeding.  Increased milk volume, as well as a change in milk consistency and color by the 5th day of breastfeeding.   Nipples that are not sore, cracked, or bleeding. Signs That Your [employer] is Getting Enough Milk  Wetting at least 3 diapers in a 24-hour period. The urine  should be clear and pale yellow by age 64 days.  At least 3 stools in a 24-hour period by age 64 days. The stool should be soft and yellow.  At least 3 stools in a 24-hour period by age 616 days. The stool should be seedy and yellow.  No loss of weight greater than 10% of birth weight during the first 91 days of age.  Average weight gain of 4 7 ounces (120 210 mL) per week after age 61 days.  Consistent daily weight gain by age 65 days, without weight loss after the age of 2 weeks. After a feeding, your baby may spit up a small amount. This is common. BREASTFEEDING FREQUENCY AND DURATION Frequent feeding will help you make more milk and can prevent sore nipples and breast engorgement. Breastfeed when you feel the need to reduce the fullness of your breasts or when your baby shows signs of hunger. This is called "breastfeeding on demand." Avoid introducing a pacifier to your baby while you are working to establish breastfeeding (the first 4 6 weeks after your baby is born). After this time you may choose to use a pacifier. Research has shown that pacifier use during the first year of a baby's life decreases the risk of sudden infant death syndrome (SIDS). Allow your baby to feed on each breast as long as he or she wants. Breastfeed until your baby is finished feeding. When your baby unlatches or falls asleep while feeding from the first breast, offer the second breast. Because newborns are often sleepy in the first few weeks of life, you may need to awaken your baby to get him or her to feed. Breastfeeding times will vary from baby to baby. However, the following rules can serve as a guide to help you ensure that your baby is properly fed:  Newborns (babies 53 weeks of age or younger) may breastfeed every 1 3 hours.  Newborns should not go longer than 3 hours during the day or 5 hours during the night without breastfeeding.  You should breastfeed your baby a minimum of  8 times in a 24-hour period until  you begin to introduce solid foods to your baby at around 55 months of age. BREAST MILK PUMPING Pumping and storing breast milk allows you to ensure that your baby is exclusively fed your breast milk, even at times when you are unable to breastfeed. This is especially important if you are going back to work while you are still breastfeeding or when you are not able to be present during feedings. Your lactation consultant can give you guidelines on how long it is safe to store breast milk.  A breast pump is a machine that allows you to pump milk from your breast into a sterile bottle. The pumped breast milk can then be stored in a refrigerator or freezer. Some breast pumps are operated by hand, while others use electricity. Ask your lactation consultant which type will work best for you. Breast pumps can be purchased, but some hospitals and breastfeeding support groups lease breast pumps on a monthly basis. A lactation consultant can teach you how to hand express breast milk, if you prefer not to use a pump.  CARING FOR YOUR BREASTS WHILE YOU BREASTFEED Nipples can become dry, cracked, and sore while breastfeeding. The following recommendations can help keep your breasts moisturized and healthy:  Avoid using soap on your nipples.   Wear a supportive bra. Although not required, special nursing bras and tank tops are designed to allow access to your breasts for breastfeeding without taking off your entire bra or top. Avoid wearing underwire style bras or extremely tight bras.  Air dry your nipples for 3 4minutes after each feeding.   Use only cotton bra pads to absorb leaked breast milk. Leaking of breast milk between feedings is normal.   Use lanolin on your nipples after breastfeeding. Lanolin helps to maintain your skin's normal moisture barrier. If you use pure lanolin you do not need to wash it off before feeding your baby again. Pure lanolin is not toxic to your baby. You may also hand express a  few drops of breast milk and gently massage that milk into your nipples and allow the milk to air dry. In the first few weeks after giving birth, some women experience extremely full breasts (engorgement). Engorgement can make your breasts feel heavy, warm, and tender to the touch. Engorgement peaks within 3 5 days after you give birth. The following recommendations can help ease engorgement:  Completely empty your breasts while breastfeeding or pumping. You may want to start by applying warm, moist heat (in the shower or with warm water-soaked hand towels) just before feeding or pumping. This increases circulation and helps the milk flow. If your baby does not completely empty your breasts while breastfeeding, pump any extra milk after he or she is finished.  Wear a snug bra (nursing or regular) or tank top for 1 2 days to signal your body to slightly decrease milk production.  Apply ice packs to your breasts, unless this is too uncomfortable for you.  Make sure that your baby is latched on and positioned properly while breastfeeding. If engorgement persists after 48 hours of following these recommendations, contact your health care provider or a Science writer. OVERALL HEALTH CARE RECOMMENDATIONS WHILE BREASTFEEDING  Eat healthy foods. Alternate between meals and snacks, eating 3 of each per day. Because what you eat affects your breast milk, some of the foods may make your baby more irritable than usual. Avoid eating these foods if you are sure that they are  negatively affecting your baby.  Drink milk, fruit juice, and water to satisfy your thirst (about 10 glasses a day).   Rest often, relax, and continue to take your prenatal vitamins to prevent fatigue, stress, and anemia.  Continue breast self-awareness checks.  Avoid chewing and smoking tobacco.  Avoid alcohol and drug use. Some medicines that may be harmful to your baby can pass through breast milk. It is important to ask your  health care provider before taking any medicine, including all over-the-counter and prescription medicine as well as vitamin and herbal supplements. It is possible to become pregnant while breastfeeding. If birth control is desired, ask your health care provider about options that will be safe for your baby. SEEK MEDICAL CARE IF:   You feel like you want to stop breastfeeding or have become frustrated with breastfeeding.  You have painful breasts or nipples.  Your nipples are cracked or bleeding.  Your breasts are red, tender, or warm.  You have a swollen area on either breast.  You have a fever or chills.  You have nausea or vomiting.  You have drainage other than breast milk from your nipples.  Your breasts do not become full before feedings by the 5th day after you give birth.  You feel sad and depressed.  Your baby is too sleepy to eat well.  Your baby is having trouble sleeping.   Your baby is wetting less than 3 diapers in a 24-hour period.  Your baby has less than 3 stools in a 24-hour period.  Your baby's skin or the white part of his or her eyes becomes yellow.   Your baby is not gaining weight by 5 days of age. SEEK IMMEDIATE MEDICAL CARE IF:   Your baby is overly tired (lethargic) and does not want to wake up and feed.  Your baby develops an unexplained fever. Document Released: 01/29/2005 Document Revised: 10/01/2012 Document Reviewed: 07/23/2012 [employer] Patient Information 2014 [employer].  

## 2013-04-09 ENCOUNTER — Ambulatory Visit (HOSPITAL_COMMUNITY)
Admission: RE | Admit: 2013-04-09 | Discharge: 2013-04-09 | Disposition: A | Discharge status: Home / Self Care | Payer: Medicaid Other | Source: Ambulatory Visit | Attending: Family Medicine | Admitting: Family Medicine

## 2013-04-09 ENCOUNTER — Other Ambulatory Visit (HOSPITAL_COMMUNITY): Payer: Self-pay | Admitting: Maternal and Fetal Medicine

## 2013-04-09 ENCOUNTER — Other Ambulatory Visit: Payer: Self-pay | Admitting: Family Medicine

## 2013-04-09 DIAGNOSIS — O352XX0 Maternal care for (suspected) hereditary disease in fetus, not applicable or unspecified: Secondary | ICD-10-CM

## 2013-04-09 DIAGNOSIS — O2620 Pregnancy care for patient with recurrent pregnancy loss, unspecified trimester: Secondary | ICD-10-CM

## 2013-04-09 DIAGNOSIS — O09299 Supervision of pregnancy with other poor reproductive or obstetric history, unspecified trimester: Secondary | ICD-10-CM

## 2013-04-16 ENCOUNTER — Telehealth: Payer: Self-pay | Admitting: *Deleted

## 2013-04-16 NOTE — Telephone Encounter (Signed)
Would like a call back about some concerns she has about her pregnancy.

## 2013-04-16 NOTE — Telephone Encounter (Signed)
Called patient back. She was inquiring about her iron level. I advised that we haven't checked it since October. She reports some weakness and just in general feeling tired. I advised that next week at her visit we will be rechecking her iron as well as doing her other 28 week labs. Patient also inquired about decreasing work. I advised that she discuss this with her provider at next visit. Patient agrees.

## 2013-04-23 ENCOUNTER — Ambulatory Visit (HOSPITAL_COMMUNITY): Payer: Medicaid Other

## 2013-04-23 ENCOUNTER — Encounter: Payer: Self-pay | Admitting: Obstetrics and Gynecology

## 2013-04-23 ENCOUNTER — Ambulatory Visit (HOSPITAL_COMMUNITY)
Admission: RE | Admit: 2013-04-23 | Discharge: 2013-04-23 | Disposition: A | Discharge status: Home / Self Care | Payer: Medicaid Other | Source: Ambulatory Visit | Attending: Family Medicine | Admitting: Family Medicine

## 2013-04-23 ENCOUNTER — Ambulatory Visit (INDEPENDENT_AMBULATORY_CARE_PROVIDER_SITE_OTHER): Payer: Medicaid Other | Admitting: Obstetrics and Gynecology

## 2013-04-23 ENCOUNTER — Encounter: Payer: Self-pay | Admitting: Obstetrics & Gynecology

## 2013-04-23 ENCOUNTER — Encounter (HOSPITAL_COMMUNITY): Payer: Self-pay

## 2013-04-23 VITALS — BP 123/77 | Temp 97.2°F | Wt 147.0 lb

## 2013-04-23 DIAGNOSIS — O2620 Pregnancy care for patient with recurrent pregnancy loss, unspecified trimester: Secondary | ICD-10-CM | POA: Insufficient documentation

## 2013-04-23 DIAGNOSIS — O10912 Unspecified pre-existing hypertension complicating pregnancy, second trimester: Secondary | ICD-10-CM

## 2013-04-23 DIAGNOSIS — N390 Urinary tract infection, site not specified: Secondary | ICD-10-CM

## 2013-04-23 DIAGNOSIS — O10019 Pre-existing essential hypertension complicating pregnancy, unspecified trimester: Secondary | ICD-10-CM

## 2013-04-23 DIAGNOSIS — R8271 Bacteriuria: Secondary | ICD-10-CM

## 2013-04-23 DIAGNOSIS — O2340 Unspecified infection of urinary tract in pregnancy, unspecified trimester: Secondary | ICD-10-CM

## 2013-04-23 DIAGNOSIS — O2390 Unspecified genitourinary tract infection in pregnancy, unspecified trimester: Secondary | ICD-10-CM

## 2013-04-23 DIAGNOSIS — O9989 Other specified diseases and conditions complicating pregnancy, childbirth and the puerperium: Secondary | ICD-10-CM

## 2013-04-23 DIAGNOSIS — O352XX0 Maternal care for (suspected) hereditary disease in fetus, not applicable or unspecified: Secondary | ICD-10-CM

## 2013-04-23 DIAGNOSIS — O09299 Supervision of pregnancy with other poor reproductive or obstetric history, unspecified trimester: Secondary | ICD-10-CM | POA: Insufficient documentation

## 2013-04-23 DIAGNOSIS — B955 Unspecified streptococcus as the cause of diseases classified elsewhere: Secondary | ICD-10-CM

## 2013-04-23 DIAGNOSIS — B951 Streptococcus, group B, as the cause of diseases classified elsewhere: Secondary | ICD-10-CM

## 2013-04-23 LAB — CBC
HEMATOCRIT: 31.2 % — AB (ref 36.0–46.0)
HEMOGLOBIN: 10.9 g/dL — AB (ref 12.0–15.0)
MCH: 25 pg — AB (ref 26.0–34.0)
MCHC: 34.9 g/dL (ref 30.0–36.0)
MCV: 71.6 fL — AB (ref 78.0–100.0)
Platelets: 240 10*3/uL (ref 150–400)
RBC: 4.36 MIL/uL (ref 3.87–5.11)
RDW: 16.9 % — ABNORMAL HIGH (ref 11.5–15.5)
WBC: 7.5 10*3/uL (ref 4.0–10.5)

## 2013-04-23 LAB — POCT URINALYSIS DIP (DEVICE)
Bilirubin Urine: NEGATIVE
Glucose, UA: NEGATIVE mg/dL
HGB URINE DIPSTICK: NEGATIVE
KETONES UR: NEGATIVE mg/dL
Nitrite: NEGATIVE
PH: 6.5 (ref 5.0–8.0)
PROTEIN: 30 mg/dL — AB
Specific Gravity, Urine: 1.025 (ref 1.005–1.030)
Urobilinogen, UA: 1 mg/dL (ref 0.0–1.0)

## 2013-04-23 LAB — RPR

## 2013-04-23 LAB — OB RESULTS CONSOLE GBS: STREP GROUP B AG: POSITIVE

## 2013-04-23 NOTE — Patient Instructions (Signed)
Hypertension During Pregnancy Hypertension is also called high blood pressure. It can occur at any time in life and during pregnancy. When you have hypertension, there is extra pressure inside your blood vessels that carry blood from the heart to the rest of your body (arteries). Hypertension during pregnancy can cause problems for you and your baby. Your baby might not weigh as much as it should at birth or might be born early (premature). Very bad cases of hypertension during pregnancy can be life threatening.  Different types of hypertension can occur during pregnancy.   Chronic hypertension. This happens when a woman has hypertension before pregnancy and it continues during pregnancy.  Gestational hypertension. This is when hypertension develops during pregnancy.  Preeclampsia or toxemia of pregnancy. This is a very serious type of hypertension that develops only during pregnancy. It is a disease that affects the whole body (systemic) and can be very dangerous for both mother and baby.  Gestational hypertension and preeclampsia usually go away after your baby is born. Blood pressure generally stabilizes within 6 weeks. Women who have hypertension during pregnancy have a greater chance of developing hypertension later in life or with future pregnancies. RISK FACTORS Some factors make you more likely to develop hypertension during pregnancy. Risk factors include:  Having hypertension before pregnancy.  Having hypertension during a previous pregnancy.  Being overweight.  Being older than 40.  Being pregnant with more than one baby (multiples).  Having diabetes or kidney problems. SIGNS AND SYMPTOMS Chronic and gestational hypertension rarely cause symptoms. Preeclampsia has symptoms, which may include:  Increased protein in your urine. Your health care provider will check for this at every prenatal visit.  Swelling of your hands and face.  Rapid weight gain.  Headaches.  Visual  changes.  Being bothered by light.  Abdominal pain, especially in the right upper area.  Chest pain.  Shortness of breath.  Increased reflexes.  Seizures. Seizures occur with a more severe form of preeclampsia, called eclampsia. DIAGNOSIS   You may be diagnosed with hypertension during a regular prenatal exam. At each visit, tests may include:  Blood pressure checks.  A urine test to check for protein in your urine.  The type of hypertension you are diagnosed with depends on when you developed it. It also depends on your specific blood pressure reading.  Developing hypertension before 20 weeks of pregnancy is consistent with chronic hypertension.  Developing hypertension after 20 weeks of pregnancy is consistent with gestational hypertension.  Hypertension with increased urinary protein is diagnosed as preeclampsia.  Blood pressure measurements that stay above 160 systolic or 110 diastolic are a sign of severe preeclampsia. TREATMENT Treatment for hypertension during pregnancy varies. Treatment depends on the type of hypertension and how serious it is.  If you take medicine for chronic hypertension, you may need to switch medicines.  Drugs called ACE inhibitors should not be taken during pregnancy.  Low-dose aspirin may be suggested for women who have risk factors for preeclampsia.  If you have gestational hypertension, you may need to take a blood pressure medicine that is safe during pregnancy. Your health care provider will recommend the appropriate medicine.  If you have severe preeclampsia, you may need to be in the hospital. Health care providers will watch you and the baby very closely. You also may need to take medicine (magnesium sulfate) to prevent seizures and lower blood pressure.  Sometimes an early delivery is needed. This may be the case if the condition worsens. It would   be done to protect you and the baby. The only cure for preeclampsia is delivery. HOME  CARE INSTRUCTIONS  Schedule and keep all of your regular appointments for prenatal care.  Only take over-the-counter or prescription medicines as directed by your health care provider. Tell your health care provider about all medicines you take.  Eat as little salt as possible.  Get regular exercise.  Do not drink alcohol.  Do not use tobacco products.  Do not drink products with caffeine.  Lie on your left side when resting. SEEK IMMEDIATE MEDICAL CARE IF:  You have severe abdominal pain.  You have sudden swelling in the hands, ankles, or face.  You gain 4 pounds (1.8 kg) or more in 1 week.  You vomit repeatedly.  You have vaginal bleeding.  You do not feel the baby moving as much.  You have a headache.  You have blurred or double vision.  You have muscle twitching or spasms.  You have shortness of breath.  You have blue fingernails and lips.  You have blood in your urine. MAKE SURE YOU:  Understand these instructions.  Will watch your condition.  Will get help right away if you are not doing well or get worse. Document Released: 10/17/2010 Document Revised: 11/19/2012 Document Reviewed: 08/28/2012 [employer] Patient Information 2014 [employer].  

## 2013-04-23 NOTE — Progress Notes (Signed)
Pulse- 96  Patient reports numbness and swelling in both legs/hips Patient states she would like to consider the tdap shot but not get today.

## 2013-04-23 NOTE — Progress Notes (Signed)
28 wk labs today. [place] scheduled for later today. CL 4.6 at 25 wks.  On Keflex suppressive therapy. Mod LE and tr pro today> check [facility] long hours> note to decrease hours.to 5-6 hr/d. Insomnia discussed> start daily exercise/walks, may use Benadryl

## 2013-04-24 LAB — GLUCOSE TOLERANCE, 1 HOUR (50G) W/O FASTING: Glucose, 1 Hour GTT: 112 mg/dL (ref 70–140)

## 2013-04-24 LAB — HIV ANTIBODY (ROUTINE TESTING W REFLEX): HIV: NONREACTIVE

## 2013-04-25 LAB — CULTURE, OB URINE

## 2013-04-27 ENCOUNTER — Telehealth: Payer: Self-pay

## 2013-04-27 ENCOUNTER — Telehealth: Payer: Self-pay | Admitting: *Deleted

## 2013-04-27 ENCOUNTER — Encounter: Payer: Self-pay | Admitting: Obstetrics & Gynecology

## 2013-04-27 DIAGNOSIS — O2340 Unspecified infection of urinary tract in pregnancy, unspecified trimester: Secondary | ICD-10-CM | POA: Insufficient documentation

## 2013-04-27 DIAGNOSIS — B955 Unspecified streptococcus as the cause of diseases classified elsewhere: Secondary | ICD-10-CM | POA: Insufficient documentation

## 2013-04-27 MED ORDER — CEPHALEXIN 500 MG PO CAPS: 500.0000 mg | ORAL_CAPSULE | Freq: Three times a day (TID) | ORAL | Status: DC

## 2013-04-27 NOTE — Telephone Encounter (Signed)
Message copied by Laurene Melendrez M on Mon Apr 27, 2013  1:10 PM ------      Message from: POE, DEIRDRE C      Created: Mon Apr 27, 2013 11:37 AM       Rx Amox 500 tid x 7d for strep UTI ------ 

## 2013-04-27 NOTE — Telephone Encounter (Signed)
Created in error. Telephone encounter already started. Message copied into that encounter as well.

## 2013-04-27 NOTE — Telephone Encounter (Signed)
Message copied by Geanie Logan on Mon Apr 27, 2013  1:10 PM ------      Message from: POE, DEIRDRE C      Created: Mon Apr 27, 2013 11:37 AM       Rx Amox 500 tid x 7d for strep UTI ------

## 2013-04-27 NOTE — Telephone Encounter (Addendum)
Spoke to Dr. Kennon Rounds regarding Pt.'s allergies and Amoxicillin; Dr. Kennon Rounds ordered for Keflex to be prescribed in replace of Amoxicillin for group B strep UTI. Medication ordered and sent to [facility] on [address]. Pt. Needs to be informed.   Called pt and informed her of +UTI requiring antibiotic treatment.  Pt states she cannot take Keflex as it causes her to have nausea and vomiting. I stated that I will speak to Dr. Kennon Rounds and then call her back.  I notified Dr. Kennon Rounds and she requested pt to come in for repeat culture as sensitivities were not performed on previous urine culture specimen. I called pt and left a message for her to come to clinic for repeat urine culture and sensitivity test. She may come in today by 4pm or tomorrow between 8-4.  She may call back if she has questions.  Call placed to [facility] to cancel Rx for Keflex.

## 2013-04-27 NOTE — Telephone Encounter (Signed)
Called [place] and left a message we are calling back , please call us back and leave a more detailed message as to what the letter needs to address. Per chart review we have already given her one letter addressing some issues.

## 2013-04-27 NOTE — Telephone Encounter (Signed)
Hayley Calhoun left a message stating she is calling regarding her Doctor's note for her job. States her job needs more specifics regarding her restrictions.

## 2013-04-29 ENCOUNTER — Encounter: Payer: Self-pay | Admitting: *Deleted

## 2013-04-29 NOTE — Telephone Encounter (Signed)
Called patient and discussed need for her to come by and give [place] a urine sample so we can determine which antibiotic will best treat the type of bacteria in her urine. Patient verbalized understanding and stated she would come by. Patient had no further questions

## 2013-04-30 ENCOUNTER — Encounter: Payer: Self-pay | Admitting: Family Medicine

## 2013-04-30 ENCOUNTER — Ambulatory Visit (INDEPENDENT_AMBULATORY_CARE_PROVIDER_SITE_OTHER): Payer: Medicaid Other | Admitting: Family Medicine

## 2013-04-30 VITALS — BP 112/72 | Temp 97.8°F | Wt 151.9 lb

## 2013-04-30 DIAGNOSIS — O0990 Supervision of high risk pregnancy, unspecified, unspecified trimester: Secondary | ICD-10-CM

## 2013-04-30 DIAGNOSIS — R339 Retention of urine, unspecified: Secondary | ICD-10-CM

## 2013-04-30 DIAGNOSIS — O2390 Unspecified genitourinary tract infection in pregnancy, unspecified trimester: Secondary | ICD-10-CM

## 2013-04-30 DIAGNOSIS — B955 Unspecified streptococcus as the cause of diseases classified elsewhere: Secondary | ICD-10-CM

## 2013-04-30 DIAGNOSIS — N390 Urinary tract infection, site not specified: Secondary | ICD-10-CM

## 2013-04-30 DIAGNOSIS — B951 Streptococcus, group B, as the cause of diseases classified elsewhere: Secondary | ICD-10-CM

## 2013-04-30 DIAGNOSIS — O2340 Unspecified infection of urinary tract in pregnancy, unspecified trimester: Principal | ICD-10-CM

## 2013-04-30 LAB — POCT URINALYSIS DIP (DEVICE)
BILIRUBIN URINE: NEGATIVE
Glucose, UA: NEGATIVE mg/dL
HGB URINE DIPSTICK: NEGATIVE
Ketones, ur: NEGATIVE mg/dL
Nitrite: POSITIVE — AB
Protein, ur: NEGATIVE mg/dL
Specific Gravity, Urine: 1.02 (ref 1.005–1.030)
Urobilinogen, UA: 1 mg/dL (ref 0.0–1.0)
pH: 7.5 (ref 5.0–8.0)

## 2013-04-30 MED ORDER — CLINDAMYCIN HCL 300 MG PO CAPS: 300.0000 mg | ORAL_CAPSULE | Freq: Three times a day (TID) | ORAL | Status: DC

## 2013-04-30 NOTE — Progress Notes (Signed)
P=77 28 wk labs last appointment, Pt is cramping, describes as menstral cramps since yesterday. Patient describes odor discharge.

## 2013-04-30 NOTE — Patient Instructions (Signed)
Breastfeeding Deciding to breastfeed is one of the best choices you can make for you and your baby. A change in hormones during pregnancy causes your breast tissue to grow and increases the number and size of your milk ducts. These hormones also allow proteins, sugars, and fats from your blood supply to make breast milk in your milk-producing glands. Hormones prevent breast milk from being released before your baby is born as well as prompt milk flow after birth. Once breastfeeding has begun, thoughts of your baby, as well as his or her sucking or crying, can stimulate the release of milk from your milk-producing glands.  BENEFITS OF BREASTFEEDING For Your Baby  Your first milk (colostrum) helps your baby's digestive system function better.   There are antibodies in your milk that help your baby fight off infections.   Your baby has a lower incidence of asthma, allergies, and sudden infant death syndrome.   The nutrients in breast milk are better for your baby than infant formulas and are designed uniquely for your baby's needs.   Breast milk improves your baby's brain development.   Your baby is less likely to develop other conditions, such as childhood obesity, asthma, or type 2 diabetes mellitus.  For You   Breastfeeding helps to create a very special bond between you and your baby.   Breastfeeding is convenient. Breast milk is always available at the correct temperature and costs nothing.   Breastfeeding helps to burn calories and helps you lose the weight gained during pregnancy.   Breastfeeding makes your uterus contract to its prepregnancy size faster and slows bleeding (lochia) after you give birth.   Breastfeeding helps to lower your risk of developing type 2 diabetes mellitus, osteoporosis, and breast or ovarian cancer later in life. SIGNS THAT YOUR BABY IS HUNGRY Early Signs of Hunger  Increased alertness or activity.  Stretching.  Movement of the head from  side to side.  Movement of the head and opening of the mouth when the corner of the mouth or cheek is stroked (rooting).  Increased sucking sounds, smacking lips, cooing, sighing, or squeaking.  Hand-to-mouth movements.  Increased sucking of fingers or hands. Late Signs of Hunger  Fussing.  Intermittent crying. Extreme Signs of Hunger Signs of extreme hunger will require calming and consoling before your baby will be able to breastfeed successfully. Do not wait for the following signs of extreme hunger to occur before you initiate breastfeeding:   Restlessness.  A loud, strong cry.   Screaming. BREASTFEEDING BASICS Breastfeeding Initiation  Find a comfortable place to sit or lie down, with your neck and back well supported.  Place a pillow or rolled up blanket under your baby to bring him or her to the level of your breast (if you are seated). Nursing pillows are specially designed to help support your arms and your baby while you breastfeed.  Make sure that your baby's abdomen is facing your abdomen.   Gently massage your breast. With your fingertips, massage from your chest wall toward your nipple in a circular motion. This encourages milk flow. You may need to continue this action during the feeding if your milk flows slowly.  Support your breast with 4 fingers underneath and your thumb above your nipple. Make sure your fingers are well away from your nipple and your baby's mouth.   Stroke your baby's lips gently with your finger or nipple.   When your baby's mouth is open wide enough, quickly bring your baby to your   breast, placing your entire nipple and as much of the colored area around your nipple (areola) as possible into your baby's mouth.   More areola should be visible above your baby's upper lip than below the lower lip.   Your baby's tongue should be between his or her lower gum and your breast.   Ensure that your baby's mouth is correctly positioned  around your nipple (latched). Your baby's lips should create a seal on your breast and be turned out (everted).  It is common for your baby to suck about 2 3 minutes in order to start the flow of breast milk. Latching Teaching your baby how to latch on to your breast properly is very important. An improper latch can cause nipple pain and decreased milk supply for you and poor weight gain in your baby. Also, if your baby is not latched onto your nipple properly, he or she may swallow some air during feeding. This can make your baby fussy. Burping your baby when you switch breasts during the feeding can help to get rid of the air. However, teaching your baby to latch on properly is still the best way to prevent fussiness from swallowing air while breastfeeding. Signs that your baby has successfully latched on to your nipple:    Silent tugging or silent sucking, without causing you pain.   Swallowing heard between every 3 4 sucks.    Muscle movement above and in front of his or her ears while sucking.  Signs that your baby has not successfully latched on to nipple:   Sucking sounds or smacking sounds from your baby while breastfeeding.  Nipple pain. If you think your baby has not latched on correctly, slip your finger into the corner of your baby's mouth to break the suction and place it between your baby's gums. Attempt breastfeeding initiation again. Signs of Successful Breastfeeding Signs from your baby:   A gradual decrease in the number of sucks or complete cessation of sucking.   Falling asleep.   Relaxation of his or her body.   Retention of a small amount of milk in his or her mouth.   Letting go of your breast by himself or herself. Signs from you:  Breasts that have increased in firmness, weight, and size 1 3 hours after feeding.   Breasts that are softer immediately after breastfeeding.  Increased milk volume, as well as a change in milk consistency and color by  the 5th day of breastfeeding.   Nipples that are not sore, cracked, or bleeding. Signs That Your [employer] is Getting Enough Milk  Wetting at least 3 diapers in a 24-hour period. The urine should be clear and pale yellow by age 64411 days.  At least 3 stools in a 24-hour period by age 64411 days. The stool should be soft and yellow.  At least 3 stools in a 24-hour period by age 644 days. The stool should be seedy and yellow.  No loss of weight greater than 10% of birth weight during the first 22 days of age.  Average weight gain of 4 7 ounces (120 210 mL) per week after age 64 days.  Consistent daily weight gain by age 60 days, without weight loss after the age of 2 weeks. After a feeding, your baby may spit up a small amount. This is common. BREASTFEEDING FREQUENCY AND DURATION Frequent feeding will help you make more milk and can prevent sore nipples and breast engorgement. Breastfeed when you feel the need to reduce  the fullness of your breasts or when your baby shows signs of hunger. This is called "breastfeeding on demand." Avoid introducing a pacifier to your baby while you are working to establish breastfeeding (the first 4 6 weeks after your baby is born). After this time you may choose to use a pacifier. Research has shown that pacifier use during the first year of a baby's life decreases the risk of sudden infant death syndrome (SIDS). Allow your baby to feed on each breast as long as he or she wants. Breastfeed until your baby is finished feeding. When your baby unlatches or falls asleep while feeding from the first breast, offer the second breast. Because newborns are often sleepy in the first few weeks of life, you may need to awaken your baby to get him or her to feed. Breastfeeding times will vary from baby to baby. However, the following rules can serve as a guide to help you ensure that your baby is properly fed:  Newborns (babies 4 weeks of age or younger) may breastfeed every 1 3  hours.  Newborns should not go longer than 3 hours during the day or 5 hours during the night without breastfeeding.  You should breastfeed your baby a minimum of 8 times in a 24-hour period until you begin to introduce solid foods to your baby at around 6 months of age. BREAST MILK PUMPING Pumping and storing breast milk allows you to ensure that your baby is exclusively fed your breast milk, even at times when you are unable to breastfeed. This is especially important if you are going back to work while you are still breastfeeding or when you are not able to be present during feedings. Your lactation consultant can give you guidelines on how long it is safe to store breast milk.  A breast pump is a machine that allows you to pump milk from your breast into a sterile bottle. The pumped breast milk can then be stored in a refrigerator or freezer. Some breast pumps are operated by hand, while others use electricity. Ask your lactation consultant which type will work best for you. Breast pumps can be purchased, but some hospitals and breastfeeding support groups lease breast pumps on a monthly basis. A lactation consultant can teach you how to hand express breast milk, if you prefer not to use a pump.  CARING FOR YOUR BREASTS WHILE YOU BREASTFEED Nipples can become dry, cracked, and sore while breastfeeding. The following recommendations can help keep your breasts moisturized and healthy:  Avoid using soap on your nipples.   Wear a supportive bra. Although not required, special nursing bras and tank tops are designed to allow access to your breasts for breastfeeding without taking off your entire bra or top. Avoid wearing underwire style bras or extremely tight bras.  Air dry your nipples for 3 4minutes after each feeding.   Use only cotton bra pads to absorb leaked breast milk. Leaking of breast milk between feedings is normal.   Use lanolin on your nipples after breastfeeding. Lanolin helps to  maintain your skin's normal moisture barrier. If you use pure lanolin you do not need to wash it off before feeding your baby again. Pure lanolin is not toxic to your baby. You may also hand express a few drops of breast milk and gently massage that milk into your nipples and allow the milk to air dry. In the first few weeks after giving birth, some women experience extremely full breasts (engorgement). Engorgement can make   your breasts feel heavy, warm, and tender to the touch. Engorgement peaks within 3 5 days after you give birth. The following recommendations can help ease engorgement:  Completely empty your breasts while breastfeeding or pumping. You may want to start by applying warm, moist heat (in the shower or with warm water-soaked hand towels) just before feeding or pumping. This increases circulation and helps the milk flow. If your baby does not completely empty your breasts while breastfeeding, pump any extra milk after he or she is finished.  Wear a snug bra (nursing or regular) or tank top for 1 2 days to signal your body to slightly decrease milk production.  Apply ice packs to your breasts, unless this is too uncomfortable for you.  Make sure that your baby is latched on and positioned properly while breastfeeding. If engorgement persists after 48 hours of following these recommendations, contact your health care provider or a Science writer. OVERALL HEALTH CARE RECOMMENDATIONS WHILE BREASTFEEDING  Eat healthy foods. Alternate between meals and snacks, eating 3 of each per day. Because what you eat affects your breast milk, some of the foods may make your baby more irritable than usual. Avoid eating these foods if you are sure that they are negatively affecting your baby.  Drink milk, fruit juice, and water to satisfy your thirst (about 10 glasses a day).   Rest often, relax, and continue to take your prenatal vitamins to prevent fatigue, stress, and anemia.  Continue  breast self-awareness checks.  Avoid chewing and smoking tobacco.  Avoid alcohol and drug use. Some medicines that may be harmful to your baby can pass through breast milk. It is important to ask your health care provider before taking any medicine, including all over-the-counter and prescription medicine as well as vitamin and herbal supplements. It is possible to become pregnant while breastfeeding. If birth control is desired, ask your health care provider about options that will be safe for your baby. SEEK MEDICAL CARE IF:   You feel like you want to stop breastfeeding or have become frustrated with breastfeeding.  You have painful breasts or nipples.  Your nipples are cracked or bleeding.  Your breasts are red, tender, or warm.  You have a swollen area on either breast.  You have a fever or chills.  You have nausea or vomiting.  You have drainage other than breast milk from your nipples.  Your breasts do not become full before feedings by the 5th day after you give birth.  You feel sad and depressed.  Your baby is too sleepy to eat well.  Your baby is having trouble sleeping.   Your baby is wetting less than 3 diapers in a 24-hour period.  Your baby has less than 3 stools in a 24-hour period.  Your baby's skin or the white part of his or her eyes becomes yellow.   Your baby is not gaining weight by 74 days of age. SEEK IMMEDIATE MEDICAL CARE IF:   Your baby is overly tired (lethargic) and does not want to wake up and feed.  Your baby develops an unexplained fever. Document Released: 01/29/2005 Document Revised: 10/01/2012 Document Reviewed: 07/23/2012 [facility] Patient Information 2014 [place].

## 2013-04-30 NOTE — Progress Notes (Signed)
Probable UTI--check culture--need sensitivities if GBS again---rx for Clindamycin sent--s/sx's fo pyelo reviewed C/o contractions--check cervix--feels non-labored

## 2013-05-03 LAB — CULTURE, OB URINE: Colony Count: >=100000

## 2013-05-04 ENCOUNTER — Telehealth: Payer: Self-pay

## 2013-05-04 DIAGNOSIS — B955 Unspecified streptococcus as the cause of diseases classified elsewhere: Secondary | ICD-10-CM

## 2013-05-04 DIAGNOSIS — O2340 Unspecified infection of urinary tract in pregnancy, unspecified trimester: Principal | ICD-10-CM

## 2013-05-04 MED ORDER — CIPROFLOXACIN HCL 500 MG PO TABS: 500.0000 mg | ORAL_TABLET | Freq: Two times a day (BID) | ORAL | Status: DC

## 2013-05-04 NOTE — Telephone Encounter (Signed)
Message copied by Geanie Logan on Mon May 04, 2013  9:51 AM ------      Message from: Donnamae Jude      Created: Mon May 04, 2013  9:07 AM       Will need Cipro 500 mg bid x 7 d, # 14, no RF ------

## 2013-05-04 NOTE — Telephone Encounter (Signed)
Called pt. And informed her of UTI and prescription for Cipro at pharmacy. Pt. States when she was here the provider told her she would prescribe an ABX and that pt. Should go ahead and start taking it-- Pt. Has been taking Clindamycin and states it has been helping. Informed pt. That the bacteria for this UTI is different than the bacteria shown on her last urine culture so stated I would speak to the provider to see if she should still take the Cipro and call pt. Back. Pt. Verbalized understanding.

## 2013-05-04 NOTE — Telephone Encounter (Signed)
Spoke to Dr. Kennon Rounds who would like pt. To finish the clindamycin, for group B strep UTI, and start cipro as well. Called pt. And informed her that she does need to continue to take clindamycin until finished in its entirety and to also start and take Cipro in its entirety as this is to fight a new bacteria E.Coli. Pt. Verbalized understanding and had no questions or concerns.

## 2013-05-07 ENCOUNTER — Encounter: Payer: Medicaid Other | Admitting: Family Medicine

## 2013-05-14 ENCOUNTER — Ambulatory Visit (INDEPENDENT_AMBULATORY_CARE_PROVIDER_SITE_OTHER): Payer: Medicaid Other | Admitting: Family

## 2013-05-14 VITALS — BP 125/77 | Temp 98.6°F | Wt 154.1 lb

## 2013-05-14 DIAGNOSIS — O09299 Supervision of pregnancy with other poor reproductive or obstetric history, unspecified trimester: Secondary | ICD-10-CM

## 2013-05-14 DIAGNOSIS — O10912 Unspecified pre-existing hypertension complicating pregnancy, second trimester: Secondary | ICD-10-CM

## 2013-05-14 DIAGNOSIS — O10019 Pre-existing essential hypertension complicating pregnancy, unspecified trimester: Secondary | ICD-10-CM

## 2013-05-14 DIAGNOSIS — O0990 Supervision of high risk pregnancy, unspecified, unspecified trimester: Secondary | ICD-10-CM

## 2013-05-14 LAB — POCT URINALYSIS DIP (DEVICE)
Bilirubin Urine: NEGATIVE
Glucose, UA: NEGATIVE mg/dL
Hgb urine dipstick: NEGATIVE
Ketones, ur: NEGATIVE mg/dL
Nitrite: NEGATIVE
Protein, ur: NEGATIVE mg/dL
Specific Gravity, Urine: 1.015 (ref 1.005–1.030)
UROBILINOGEN UA: 0.2 mg/dL (ref 0.0–1.0)
pH: 7.5 (ref 5.0–8.0)

## 2013-05-14 MED ORDER — TETANUS-DIPHTH-ACELL PERTUSSIS 5-2.5-18.5 LF-MCG/0.5 IM SUSP: 0.5000 mL | Freq: Once | INTRAMUSCULAR | Status: DC

## 2013-05-14 NOTE — Progress Notes (Signed)
P= 89 C/o of intermittent lower abdominal/pelvic pain/cramping. Braxton hicks.  C/o of dizziness last week, tried to check her BP but her home cuff wasn't working. Pt. Reports that she is well hydrated and drinks water constantly. Pt. States her mom bought her iron pills and asks if its OK to take them. Informed her that an iron tab a day will not hurt as her iron was low from CBC and may have been cause of dizziness.  Edema in feet.

## 2013-05-28 ENCOUNTER — Ambulatory Visit (INDEPENDENT_AMBULATORY_CARE_PROVIDER_SITE_OTHER): Payer: Medicaid Other | Admitting: Family

## 2013-05-28 VITALS — BP 120/80 | Temp 98.3°F | Wt 154.6 lb

## 2013-05-28 DIAGNOSIS — O09299 Supervision of pregnancy with other poor reproductive or obstetric history, unspecified trimester: Secondary | ICD-10-CM

## 2013-05-28 DIAGNOSIS — O10912 Unspecified pre-existing hypertension complicating pregnancy, second trimester: Secondary | ICD-10-CM

## 2013-05-28 DIAGNOSIS — O10019 Pre-existing essential hypertension complicating pregnancy, unspecified trimester: Secondary | ICD-10-CM

## 2013-05-28 LAB — POCT URINALYSIS DIP (DEVICE)
Bilirubin Urine: NEGATIVE
GLUCOSE, UA: NEGATIVE mg/dL
HGB URINE DIPSTICK: NEGATIVE
Ketones, ur: NEGATIVE mg/dL
NITRITE: NEGATIVE
Protein, ur: NEGATIVE mg/dL
Specific Gravity, Urine: 1.02 (ref 1.005–1.030)
Urobilinogen, UA: 1 mg/dL (ref 0.0–1.0)
pH: 6.5 (ref 5.0–8.0)

## 2013-05-28 MED ORDER — INTEGRA F 125-1 MG PO CAPS: 1.0000 | ORAL_CAPSULE | Freq: Every day | ORAL | Status: DC

## 2013-05-28 NOTE — Progress Notes (Signed)
Reports intermittent dizziness with standing (hgb 10), begin Integra QD.  Pt also verbalizes desire to stop working.  Explained that patient can stop working whenever she desires, however currently does not have medical condition to stop working.  Letter given to patient.

## 2013-05-28 NOTE — Progress Notes (Signed)
P=99,

## 2013-06-01 ENCOUNTER — Ambulatory Visit (INDEPENDENT_AMBULATORY_CARE_PROVIDER_SITE_OTHER): Payer: Medicaid Other | Admitting: *Deleted

## 2013-06-01 VITALS — BP 123/64

## 2013-06-01 DIAGNOSIS — O10912 Unspecified pre-existing hypertension complicating pregnancy, second trimester: Secondary | ICD-10-CM

## 2013-06-01 DIAGNOSIS — O10019 Pre-existing essential hypertension complicating pregnancy, unspecified trimester: Secondary | ICD-10-CM

## 2013-06-01 LAB — US OB FOLLOW UP

## 2013-06-01 NOTE — Progress Notes (Signed)
P = 96   Pt denies H/A or visual disturbances.

## 2013-06-04 ENCOUNTER — Ambulatory Visit (INDEPENDENT_AMBULATORY_CARE_PROVIDER_SITE_OTHER): Payer: Medicaid Other | Admitting: Obstetrics & Gynecology

## 2013-06-04 VITALS — BP 119/75 | Temp 96.8°F | Wt 155.6 lb

## 2013-06-04 DIAGNOSIS — R8290 Unspecified abnormal findings in urine: Secondary | ICD-10-CM

## 2013-06-04 DIAGNOSIS — O10019 Pre-existing essential hypertension complicating pregnancy, unspecified trimester: Secondary | ICD-10-CM

## 2013-06-04 DIAGNOSIS — N390 Urinary tract infection, site not specified: Secondary | ICD-10-CM

## 2013-06-04 DIAGNOSIS — O10912 Unspecified pre-existing hypertension complicating pregnancy, second trimester: Secondary | ICD-10-CM

## 2013-06-04 DIAGNOSIS — R82998 Other abnormal findings in urine: Secondary | ICD-10-CM

## 2013-06-04 LAB — POCT URINALYSIS DIP (DEVICE)
BILIRUBIN URINE: NEGATIVE
Glucose, UA: NEGATIVE mg/dL
Hgb urine dipstick: NEGATIVE
KETONES UR: NEGATIVE mg/dL
Nitrite: POSITIVE — AB
Protein, ur: NEGATIVE mg/dL
Specific Gravity, Urine: 1.02 (ref 1.005–1.030)
Urobilinogen, UA: 1 mg/dL (ref 0.0–1.0)
pH: 7 (ref 5.0–8.0)

## 2013-06-04 NOTE — Patient Instructions (Signed)
Return to clinic for any obstetric concerns or go to [facility] for evaluation  

## 2013-06-04 NOTE — Progress Notes (Addendum)
UA shows +nitrites, patient is asymptomatic.  Has tethered cord and urinary retention, has to self-cath several times a day. Was supposed to be on Keflex prophylaxis, but this "makes me sick all day". Also allergic to other PCNs, sulfa, Macrobid but has taken them according to chart review.  Reports that any sulfa medication causes her to have itching, SOB, increased BP.  Declining presumptive antibiotic treatment today, will follow up urine culture and manage accordingly.   Patient has Interstim sacral neurostimulator implanted for treatment of her urinary retention; this was implanted last year. Wants to know if this is okay as she plans for regional analgesia during labor. Has received epidurals for last two deliveries, unsure if the Insterstim would be a problem (this was done after her other pregnancies). Given her known history of spina bifida, tethered cord and now Interstim, will have Anesthesiology consult.   BP normal.  NST performed today was reviewed and was found to be reactive.  Continue recommended antenatal testing and prenatal care. Next growth scan and AFI scheduled for 06/08/13 at 1200 at [facility]; will then have NST afterwards.  Fetal movement and labor precautions reviewed.

## 2013-06-04 NOTE — Progress Notes (Signed)
p=[id number]

## 2013-06-06 ENCOUNTER — Encounter: Payer: Self-pay | Admitting: Family

## 2013-06-06 NOTE — Progress Notes (Addendum)
Anesthesia Response to Consult Request  With regards to Hayley Calhoun, given her tethered cord and her spina bifida as well as implantation of Interstim for her neurogenic bladder, we feel that she needs a neurosurgical consult to find out if it is safe to proceed with an epidural for labor analgesia and/or a spinal tap if a C/Section needs to be performed. I looked through all of her records available in [facility] and could not find any anesthesia records indicating where she's undergone epidural placement in the past. It appears she has had multiple fetal losses and has had 2 viable infants in the past.   Thanks,  Josephine Igo, MD OB Anesthesiologist   Will proceed with Neurosurgery consult, this will be set up at next visit.  Patient scheduled to come in on 06/08/13 for fetal testing, will have this set up then, and follow up recommendations.  Message routed to the nurses' pool.

## 2013-06-07 LAB — CULTURE, OB URINE

## 2013-06-08 ENCOUNTER — Ambulatory Visit (INDEPENDENT_AMBULATORY_CARE_PROVIDER_SITE_OTHER): Payer: Medicaid Other | Admitting: *Deleted

## 2013-06-08 ENCOUNTER — Ambulatory Visit (HOSPITAL_COMMUNITY)
Admission: RE | Admit: 2013-06-08 | Discharge: 2013-06-08 | Disposition: A | Discharge status: Home / Self Care | Payer: Medicaid Other | Source: Ambulatory Visit | Attending: Obstetrics & Gynecology | Admitting: Obstetrics & Gynecology

## 2013-06-08 ENCOUNTER — Other Ambulatory Visit: Payer: Medicaid Other

## 2013-06-08 VITALS — BP 124/73 | HR 93

## 2013-06-08 DIAGNOSIS — Z3689 Encounter for other specified antenatal screening: Secondary | ICD-10-CM | POA: Insufficient documentation

## 2013-06-08 DIAGNOSIS — O10912 Unspecified pre-existing hypertension complicating pregnancy, second trimester: Secondary | ICD-10-CM

## 2013-06-08 DIAGNOSIS — O10019 Pre-existing essential hypertension complicating pregnancy, unspecified trimester: Secondary | ICD-10-CM

## 2013-06-08 LAB — US OB FOLLOW UP

## 2013-06-08 NOTE — Progress Notes (Signed)
NST reviewed and reactive.  

## 2013-06-08 NOTE — Progress Notes (Signed)
Discussed with pt the need for consult from Neurosurgeon in regards to analgesia for labor with epidural or C/S with spinal. Pt states that her spinal surgery was done @ [facility] and she would like to see the same physician.  She will bring the contact information to her next visit on 4/30 so that clinic staff can make the referral appt.

## 2013-06-09 MED ORDER — CIPROFLOXACIN HCL 500 MG PO TABS: 500.0000 mg | ORAL_TABLET | Freq: Two times a day (BID) | ORAL | Status: DC

## 2013-06-09 NOTE — Progress Notes (Signed)
Called [place] and discussed her many allergies/ senstitivies and what she is able to take. We discussed she has a uti that needs treatment. She states she can take cipro and clindamycin.  Discussed with Dr. Harolyn Rutherford and cipro ordered. Hayley Calhoun voices understanding.

## 2013-06-09 NOTE — Addendum Note (Signed)
Addended by: Samuel Germany on: 06/09/2013 08:19 AM   Modules accepted: Orders

## 2013-06-11 ENCOUNTER — Ambulatory Visit (INDEPENDENT_AMBULATORY_CARE_PROVIDER_SITE_OTHER): Payer: Medicaid Other | Admitting: Obstetrics & Gynecology

## 2013-06-11 VITALS — BP 117/70 | HR 88 | Wt 157.7 lb

## 2013-06-11 DIAGNOSIS — O10912 Unspecified pre-existing hypertension complicating pregnancy, second trimester: Secondary | ICD-10-CM

## 2013-06-11 DIAGNOSIS — O10019 Pre-existing essential hypertension complicating pregnancy, unspecified trimester: Secondary | ICD-10-CM

## 2013-06-11 LAB — POCT URINALYSIS DIP (DEVICE)
Bilirubin Urine: NEGATIVE
GLUCOSE, UA: NEGATIVE mg/dL
Ketones, ur: NEGATIVE mg/dL
NITRITE: NEGATIVE
Protein, ur: NEGATIVE mg/dL
Specific Gravity, Urine: 1.02 (ref 1.005–1.030)
UROBILINOGEN UA: 0.2 mg/dL (ref 0.0–1.0)
pH: 6.5 (ref 5.0–8.0)

## 2013-06-11 NOTE — Patient Instructions (Signed)

## 2013-06-11 NOTE — Progress Notes (Signed)
NST:  No concerns today.

## 2013-06-11 NOTE — Progress Notes (Signed)
BP nl, NST reactive today. Consult with her neurosurgeon re: risk of epidural placement

## 2013-06-15 ENCOUNTER — Ambulatory Visit (INDEPENDENT_AMBULATORY_CARE_PROVIDER_SITE_OTHER): Payer: Medicaid Other | Admitting: *Deleted

## 2013-06-15 VITALS — BP 117/71 | HR 108

## 2013-06-15 DIAGNOSIS — O10912 Unspecified pre-existing hypertension complicating pregnancy, second trimester: Secondary | ICD-10-CM

## 2013-06-15 DIAGNOSIS — O10019 Pre-existing essential hypertension complicating pregnancy, unspecified trimester: Secondary | ICD-10-CM

## 2013-06-15 LAB — US OB FOLLOW UP

## 2013-06-16 NOTE — Progress Notes (Signed)
NST performed today was reviewed and was found to be reactive.  AFI normal at 15.7 cm. Continue recommended antenatal testing and prenatal care.

## 2013-06-16 NOTE — Progress Notes (Signed)
NST reactive on 06/01/13

## 2013-06-18 ENCOUNTER — Telehealth: Payer: Self-pay | Admitting: *Deleted

## 2013-06-18 ENCOUNTER — Ambulatory Visit (INDEPENDENT_AMBULATORY_CARE_PROVIDER_SITE_OTHER): Payer: Medicaid Other | Admitting: Obstetrics & Gynecology

## 2013-06-18 VITALS — BP 120/62 | HR 97

## 2013-06-18 DIAGNOSIS — O0990 Supervision of high risk pregnancy, unspecified, unspecified trimester: Secondary | ICD-10-CM

## 2013-06-18 DIAGNOSIS — O10019 Pre-existing essential hypertension complicating pregnancy, unspecified trimester: Secondary | ICD-10-CM

## 2013-06-18 DIAGNOSIS — O10912 Unspecified pre-existing hypertension complicating pregnancy, second trimester: Secondary | ICD-10-CM

## 2013-06-18 LAB — POCT URINALYSIS DIP (DEVICE)
Bilirubin Urine: NEGATIVE
Glucose, UA: NEGATIVE mg/dL
Hgb urine dipstick: NEGATIVE
KETONES UR: NEGATIVE mg/dL
Nitrite: NEGATIVE
PROTEIN: NEGATIVE mg/dL
SPECIFIC GRAVITY, URINE: 1.02 (ref 1.005–1.030)
Urobilinogen, UA: 0.2 mg/dL (ref 0.0–1.0)
pH: 7 (ref 5.0–8.0)

## 2013-06-18 NOTE — Patient Instructions (Signed)
Risks and Benefits of Epidural Anesthesia Anesthesia is a loss of sensation produced by medicines called anesthetics. Epidural anesthesia is a type of anesthesia that is produced by an injection into the lower membrane that surrounds the spinal cord. It provides pain relief to a certain area of the body. Epidural anesthesia can be used during many different procedures or during childbirth.  BENEFITS OF EPIDURAL ANESTHESIA  Effectively relieves pain in areas of the body below the level of the spinal cord where the epidural is placed.  Provides continuous pain relief when connected to a constant flow of pain medicine.  Is the safest type of anesthesia for those with medical conditions.   Gives better pain control after a procedure or childbirth.   Lessens the amount of extra pain medicine needed after a procedure or childbirth.  Allows you to be less sleepy after a procedure or childbirth compared to other pain medicines that may be taken by mouth or given by injection.  Allows you to move around sooner after a procedure or childbirth.   Allows you to be awake during a procedure or childbirth.  RISKS OF EPIDURAL ANESTHESIA Risks may include:   A severe headache called a spinal headache.   Uneven, incomplete, or no pain relief.  Difficulty breathing.  Inability to move (paralysis).  Seizures.   Loss of consciousness.  Difficulty urinating or having bowel movements after the epidural.  Infection of the spine.   A drop in blood pressure (hypotension).   Nerve damage.  Bleeding between the spinal vertebrae and the outside lining of the spinal cord (spinal hematoma).   An allergic or toxic reaction to the epidural anesthesia.   Difficulty breastfeeding right after giving birth (postpartum period).  Loss of heart function (cardiac arrest). This is rare. SIDE EFFECTS OF EPIDURAL ANESTHESIA  Nausea and vomiting.  Shivering.  Dizziness and  fainting.  Fever.  Itching. Document Released: 01/29/2005 Document Revised: 10/01/2012 Document Reviewed: 08/21/2012 [facility] Patient Information 2014 [place].

## 2013-06-18 NOTE — Progress Notes (Signed)
Still needs to consult neurosurgeon NST reactive

## 2013-06-18 NOTE — Telephone Encounter (Signed)
Called pt to discuss the need for her to have appt with her Neurosurgeon ASAP. She said she is going to call the office today to schedule the appt. I advised her that she needs the appt within the next 2 weeks as she may have spontaneous labor at any time but more likely after [redacted] wks EGA. The plan will be for IOL at her due date unless her condition changes and delivery is indicated sooner.  I offered to speak with the Neurology office staff if necessary and she may give them my name. Pt voiced understanding and stated that she will let me know about her appt.

## 2013-06-22 ENCOUNTER — Ambulatory Visit (INDEPENDENT_AMBULATORY_CARE_PROVIDER_SITE_OTHER): Payer: Medicaid Other | Admitting: *Deleted

## 2013-06-22 VITALS — BP 117/58 | HR 104

## 2013-06-22 DIAGNOSIS — O10912 Unspecified pre-existing hypertension complicating pregnancy, second trimester: Secondary | ICD-10-CM

## 2013-06-22 DIAGNOSIS — O10019 Pre-existing essential hypertension complicating pregnancy, unspecified trimester: Secondary | ICD-10-CM

## 2013-06-22 LAB — US OB FOLLOW UP

## 2013-06-22 NOTE — Progress Notes (Signed)
NST reviewed and reactive.  

## 2013-06-25 ENCOUNTER — Ambulatory Visit (INDEPENDENT_AMBULATORY_CARE_PROVIDER_SITE_OTHER): Payer: Medicaid Other | Admitting: Family Medicine

## 2013-06-25 VITALS — BP 133/79 | HR 83 | Wt 159.1 lb

## 2013-06-25 DIAGNOSIS — R8271 Bacteriuria: Secondary | ICD-10-CM

## 2013-06-25 DIAGNOSIS — O09299 Supervision of pregnancy with other poor reproductive or obstetric history, unspecified trimester: Secondary | ICD-10-CM

## 2013-06-25 DIAGNOSIS — O10912 Unspecified pre-existing hypertension complicating pregnancy, second trimester: Secondary | ICD-10-CM

## 2013-06-25 DIAGNOSIS — O99891 Other specified diseases and conditions complicating pregnancy: Secondary | ICD-10-CM

## 2013-06-25 DIAGNOSIS — O10019 Pre-existing essential hypertension complicating pregnancy, unspecified trimester: Secondary | ICD-10-CM

## 2013-06-25 DIAGNOSIS — Q059 Spina bifida, unspecified: Secondary | ICD-10-CM

## 2013-06-25 DIAGNOSIS — O2340 Unspecified infection of urinary tract in pregnancy, unspecified trimester: Secondary | ICD-10-CM

## 2013-06-25 DIAGNOSIS — O9989 Other specified diseases and conditions complicating pregnancy, childbirth and the puerperium: Secondary | ICD-10-CM

## 2013-06-25 DIAGNOSIS — N390 Urinary tract infection, site not specified: Secondary | ICD-10-CM

## 2013-06-25 DIAGNOSIS — B955 Unspecified streptococcus as the cause of diseases classified elsewhere: Secondary | ICD-10-CM

## 2013-06-25 DIAGNOSIS — O2390 Unspecified genitourinary tract infection in pregnancy, unspecified trimester: Secondary | ICD-10-CM

## 2013-06-25 DIAGNOSIS — O0990 Supervision of high risk pregnancy, unspecified, unspecified trimester: Secondary | ICD-10-CM

## 2013-06-25 DIAGNOSIS — B951 Streptococcus, group B, as the cause of diseases classified elsewhere: Secondary | ICD-10-CM

## 2013-06-25 DIAGNOSIS — R339 Retention of urine, unspecified: Secondary | ICD-10-CM

## 2013-06-25 LAB — POCT URINALYSIS DIP (DEVICE)
Bilirubin Urine: NEGATIVE
Glucose, UA: NEGATIVE mg/dL
Ketones, ur: NEGATIVE mg/dL
Nitrite: POSITIVE — AB
PROTEIN: NEGATIVE mg/dL
SPECIFIC GRAVITY, URINE: 1.02 (ref 1.005–1.030)
UROBILINOGEN UA: 1 mg/dL (ref 0.0–1.0)
pH: 6.5 (ref 5.0–8.0)

## 2013-06-25 NOTE — Patient Instructions (Signed)
Third Trimester of Pregnancy The third trimester is from week 29 through week 42, months 7 through 9. The third trimester is a time when the fetus is growing rapidly. At the end of the ninth month, the fetus is about 20 inches in length and weighs 6 10 pounds.  BODY CHANGES Your body goes through many changes during pregnancy. The changes vary from woman to woman.   Your weight will continue to increase. You can expect to gain 25 35 pounds (11 16 kg) by the end of the pregnancy.  You may begin to get stretch marks on your hips, abdomen, and breasts.  You may urinate more often because the fetus is moving lower into your pelvis and pressing on your bladder.  You may develop or continue to have heartburn as a result of your pregnancy.  You may develop constipation because certain hormones are causing the muscles that push waste through your intestines to slow down.  You may develop hemorrhoids or swollen, bulging veins (varicose veins).  You may have pelvic pain because of the weight gain and pregnancy hormones relaxing your joints between the bones in your pelvis. Back aches may result from over exertion of the muscles supporting your posture.  Your breasts will continue to grow and be tender. A yellow discharge may leak from your breasts called colostrum.  Your belly button may stick out.  You may feel short of breath because of your expanding uterus.  You may notice the fetus "dropping," or moving lower in your abdomen.  You may have a bloody mucus discharge. This usually occurs a few days to a week before labor begins.  Your cervix becomes thin and soft (effaced) near your due date. WHAT TO EXPECT AT YOUR PRENATAL EXAMS  You will have prenatal exams every 2 weeks until week 36. Then, you will have weekly prenatal exams. During a routine prenatal visit:  You will be weighed to make sure you and the fetus are growing normally.  Your blood pressure is taken.  Your abdomen will  be measured to track your baby's growth.  The fetal heartbeat will be listened to.  Any test results from the previous visit will be discussed.  You may have a cervical check near your due date to see if you have effaced. At around 36 weeks, your caregiver will check your cervix. At the same time, your caregiver will also perform a test on the secretions of the vaginal tissue. This test is to determine if a type of bacteria, Group B streptococcus, is present. Your caregiver will explain this further. Your caregiver may ask you:  What your birth plan is.  How you are feeling.  If you are feeling the baby move.  If you have had any abnormal symptoms, such as leaking fluid, bleeding, severe headaches, or abdominal cramping.  If you have any questions. Other tests or screenings that may be performed during your third trimester include:  Blood tests that check for low iron levels (anemia).  Fetal testing to check the health, activity level, and growth of the fetus. Testing is done if you have certain medical conditions or if there are problems during the pregnancy. FALSE LABOR You may feel small, irregular contractions that eventually go away. These are called Braxton Hicks contractions, or false labor. Contractions may last for hours, days, or even weeks before true labor sets in. If contractions come at regular intervals, intensify, or become painful, it is best to be seen by your caregiver.    SIGNS OF LABOR   Menstrual-like cramps.  Contractions that are 5 minutes apart or less.  Contractions that start on the top of the uterus and spread down to the lower abdomen and back.  A sense of increased pelvic pressure or back pain.  A watery or bloody mucus discharge that comes from the vagina. If you have any of these signs before the 37th week of pregnancy, call your caregiver right away. You need to go to the hospital to get checked immediately. HOME CARE INSTRUCTIONS   Avoid all  smoking, herbs, alcohol, and unprescribed drugs. These chemicals affect the formation and growth of the baby.  Follow your caregiver's instructions regarding medicine use. There are medicines that are either safe or unsafe to take during pregnancy.  Exercise only as directed by your caregiver. Experiencing uterine cramps is a good sign to stop exercising.  Continue to eat regular, healthy meals.  Wear a good support bra for breast tenderness.  Do not use hot tubs, steam rooms, or saunas.  Wear your seat belt at all times when driving.  Avoid raw meat, uncooked cheese, cat litter boxes, and soil used by cats. These carry germs that can cause birth defects in the baby.  Take your prenatal vitamins.  Try taking a stool softener (if your caregiver approves) if you develop constipation. Eat more high-fiber foods, such as fresh vegetables or fruit and whole grains. Drink plenty of fluids to keep your urine clear or pale yellow.  Take warm sitz baths to soothe any pain or discomfort caused by hemorrhoids. Use hemorrhoid cream if your caregiver approves.  If you develop varicose veins, wear support hose. Elevate your feet for 15 minutes, 3 4 times a day. Limit salt in your diet.  Avoid heavy lifting, wear low heal shoes, and practice good posture.  Rest a lot with your legs elevated if you have leg cramps or low back pain.  Visit your dentist if you have not gone during your pregnancy. Use a soft toothbrush to brush your teeth and be gentle when you floss.  A sexual relationship may be continued unless your caregiver directs you otherwise.  Do not travel far distances unless it is absolutely necessary and only with the approval of your caregiver.  Take prenatal classes to understand, practice, and ask questions about the labor and delivery.  Make a trial run to the hospital.  Pack your hospital bag.  Prepare the baby's nursery.  Continue to go to all your prenatal visits as directed  by your caregiver. SEEK MEDICAL CARE IF:  You are unsure if you are in labor or if your water has broken.  You have dizziness.  You have mild pelvic cramps, pelvic pressure, or nagging pain in your abdominal area.  You have persistent nausea, vomiting, or diarrhea.  You have a bad smelling vaginal discharge.  You have pain with urination. SEEK IMMEDIATE MEDICAL CARE IF:   You have a fever.  You are leaking fluid from your vagina.  You have spotting or bleeding from your vagina.  You have severe abdominal cramping or pain.  You have rapid weight loss or gain.  You have shortness of breath with chest pain.  You notice sudden or extreme swelling of your face, hands, ankles, feet, or legs.  You have not felt your baby move in over an hour.  You have severe headaches that do not go away with medicine.  You have vision changes. Document Released: 01/23/2001 Document Revised: 10/01/2012 Document Reviewed:   04/01/2012 [employer] Patient Information 2014 [employer].  Breastfeeding Deciding to breastfeed is one of the best choices you can make for you and your baby. A change in hormones during pregnancy causes your breast tissue to grow and increases the number and size of your milk ducts. These hormones also allow proteins, sugars, and fats from your blood supply to make breast milk in your milk-producing glands. Hormones prevent breast milk from being released before your baby is born as well as prompt milk flow after birth. Once breastfeeding has begun, thoughts of your baby, as well as his or her sucking or crying, can stimulate the release of milk from your milk-producing glands.  BENEFITS OF BREASTFEEDING For Your Baby  Your first milk (colostrum) helps your baby's digestive system function better.   There are antibodies in your milk that help your baby fight off infections.   Your baby has a lower incidence of asthma, allergies, and sudden infant death syndrome.    The nutrients in breast milk are better for your baby than infant formulas and are designed uniquely for your baby's needs.   Breast milk improves your baby's brain development.   Your baby is less likely to develop other conditions, such as childhood obesity, asthma, or type 2 diabetes mellitus.  For You   Breastfeeding helps to create a very special bond between you and your baby.   Breastfeeding is convenient. Breast milk is always available at the correct temperature and costs nothing.   Breastfeeding helps to burn calories and helps you lose the weight gained during pregnancy.   Breastfeeding makes your uterus contract to its prepregnancy size faster and slows bleeding (lochia) after you give birth.   Breastfeeding helps to lower your risk of developing type 2 diabetes mellitus, osteoporosis, and breast or ovarian cancer later in life. SIGNS THAT YOUR BABY IS HUNGRY Early Signs of Hunger  Increased alertness or activity.  Stretching.  Movement of the head from side to side.  Movement of the head and opening of the mouth when the corner of the mouth or cheek is stroked (rooting).  Increased sucking sounds, smacking lips, cooing, sighing, or squeaking.  Hand-to-mouth movements.  Increased sucking of fingers or hands. Late Signs of Hunger  Fussing.  Intermittent crying. Extreme Signs of Hunger Signs of extreme hunger will require calming and consoling before your baby will be able to breastfeed successfully. Do not wait for the following signs of extreme hunger to occur before you initiate breastfeeding:   Restlessness.  A loud, strong cry.   Screaming. BREASTFEEDING BASICS Breastfeeding Initiation  Find a comfortable place to sit or lie down, with your neck and back well supported.  Place a pillow or rolled up blanket under your baby to bring him or her to the level of your breast (if you are seated). Nursing pillows are specially designed to help  support your arms and your baby while you breastfeed.  Make sure that your baby's abdomen is facing your abdomen.   Gently massage your breast. With your fingertips, massage from your chest wall toward your nipple in a circular motion. This encourages milk flow. You may need to continue this action during the feeding if your milk flows slowly.  Support your breast with 4 fingers underneath and your thumb above your nipple. Make sure your fingers are well away from your nipple and your baby's mouth.   Stroke your baby's lips gently with your finger or nipple.   When your baby's mouth is   open wide enough, quickly bring your baby to your breast, placing your entire nipple and as much of the colored area around your nipple (areola) as possible into your baby's mouth.   More areola should be visible above your baby's upper lip than below the lower lip.   Your baby's tongue should be between his or her lower gum and your breast.   Ensure that your baby's mouth is correctly positioned around your nipple (latched). Your baby's lips should create a seal on your breast and be turned out (everted).  It is common for your baby to suck about 2 3 minutes in order to start the flow of breast milk. Latching Teaching your baby how to latch on to your breast properly is very important. An improper latch can cause nipple pain and decreased milk supply for you and poor weight gain in your baby. Also, if your baby is not latched onto your nipple properly, he or she may swallow some air during feeding. This can make your baby fussy. Burping your baby when you switch breasts during the feeding can help to get rid of the air. However, teaching your baby to latch on properly is still the best way to prevent fussiness from swallowing air while breastfeeding. Signs that your baby has successfully latched on to your nipple:    Silent tugging or silent sucking, without causing you pain.   Swallowing heard  between every 3 4 sucks.    Muscle movement above and in front of his or her ears while sucking.  Signs that your baby has not successfully latched on to nipple:   Sucking sounds or smacking sounds from your baby while breastfeeding.  Nipple pain. If you think your baby has not latched on correctly, slip your finger into the corner of your baby's mouth to break the suction and place it between your baby's gums. Attempt breastfeeding initiation again. Signs of Successful Breastfeeding Signs from your baby:   A gradual decrease in the number of sucks or complete cessation of sucking.   Falling asleep.   Relaxation of his or her body.   Retention of a small amount of milk in his or her mouth.   Letting go of your breast by himself or herself. Signs from you:  Breasts that have increased in firmness, weight, and size 1 3 hours after feeding.   Breasts that are softer immediately after breastfeeding.  Increased milk volume, as well as a change in milk consistency and color by the 5th day of breastfeeding.   Nipples that are not sore, cracked, or bleeding. Signs That Your Baby is Getting Enough Milk  Wetting at least 3 diapers in a 24-hour period. The urine should be clear and pale yellow by age 5 days.  At least 3 stools in a 24-hour period by age 5 days. The stool should be soft and yellow.  At least 3 stools in a 24-hour period by age 7 days. The stool should be seedy and yellow.  No loss of weight greater than 10% of birth weight during the first 3 days of age.  Average weight gain of 4 7 ounces (120 210 mL) per week after age 4 days.  Consistent daily weight gain by age 5 days, without weight loss after the age of 2 weeks. After a feeding, your baby may spit up a small amount. This is common. BREASTFEEDING FREQUENCY AND DURATION Frequent feeding will help you make more milk and can prevent sore nipples and breast engorgement.   Breastfeed when you feel the need to  reduce the fullness of your breasts or when your baby shows signs of hunger. This is called "breastfeeding on demand." Avoid introducing a pacifier to your baby while you are working to establish breastfeeding (the first 4 6 weeks after your baby is born). After this time you may choose to use a pacifier. Research has shown that pacifier use during the first year of a baby's life decreases the risk of sudden infant death syndrome (SIDS). Allow your baby to feed on each breast as long as he or she wants. Breastfeed until your baby is finished feeding. When your baby unlatches or falls asleep while feeding from the first breast, offer the second breast. Because newborns are often sleepy in the first few weeks of life, you may need to awaken your baby to get him or her to feed. Breastfeeding times will vary from baby to baby. However, the following rules can serve as a guide to help you ensure that your baby is properly fed:  Newborns (babies 4 weeks of age or younger) may breastfeed every 1 3 hours.  Newborns should not go longer than 3 hours during the day or 5 hours during the night without breastfeeding.  You should breastfeed your baby a minimum of 8 times in a 24-hour period until you begin to introduce solid foods to your baby at around 6 months of age. BREAST MILK PUMPING Pumping and storing breast milk allows you to ensure that your baby is exclusively fed your breast milk, even at times when you are unable to breastfeed. This is especially important if you are going back to work while you are still breastfeeding or when you are not able to be present during feedings. Your lactation consultant can give you guidelines on how long it is safe to store breast milk.  A breast pump is a machine that allows you to pump milk from your breast into a sterile bottle. The pumped breast milk can then be stored in a refrigerator or freezer. Some breast pumps are operated by hand, while others use electricity. Ask  your lactation consultant which type will work best for you. Breast pumps can be purchased, but some hospitals and breastfeeding support groups lease breast pumps on a monthly basis. A lactation consultant can teach you how to hand express breast milk, if you prefer not to use a pump.  CARING FOR YOUR BREASTS WHILE YOU BREASTFEED Nipples can become dry, cracked, and sore while breastfeeding. The following recommendations can help keep your breasts moisturized and healthy:  Avoid using soap on your nipples.   Wear a supportive bra. Although not required, special nursing bras and tank tops are designed to allow access to your breasts for breastfeeding without taking off your entire bra or top. Avoid wearing underwire style bras or extremely tight bras.  Air dry your nipples for 3 4minutes after each feeding.   Use only cotton bra pads to absorb leaked breast milk. Leaking of breast milk between feedings is normal.   Use lanolin on your nipples after breastfeeding. Lanolin helps to maintain your skin's normal moisture barrier. If you use pure lanolin you do not need to wash it off before feeding your baby again. Pure lanolin is not toxic to your baby. You may also hand express a few drops of breast milk and gently massage that milk into your nipples and allow the milk to air dry. In the first few weeks after giving birth, some women   experience extremely full breasts (engorgement). Engorgement can make your breasts feel heavy, warm, and tender to the touch. Engorgement peaks within 3 5 days after you give birth. The following recommendations can help ease engorgement:  Completely empty your breasts while breastfeeding or pumping. You may want to start by applying warm, moist heat (in the shower or with warm water-soaked hand towels) just before feeding or pumping. This increases circulation and helps the milk flow. If your baby does not completely empty your breasts while breastfeeding, pump any extra  milk after he or she is finished.  Wear a snug bra (nursing or regular) or tank top for 1 2 days to signal your body to slightly decrease milk production.  Apply ice packs to your breasts, unless this is too uncomfortable for you.  Make sure that your baby is latched on and positioned properly while breastfeeding. If engorgement persists after 48 hours of following these recommendations, contact your health care provider or a lactation consultant. OVERALL HEALTH CARE RECOMMENDATIONS WHILE BREASTFEEDING  Eat healthy foods. Alternate between meals and snacks, eating 3 of each per day. Because what you eat affects your breast milk, some of the foods may make your baby more irritable than usual. Avoid eating these foods if you are sure that they are negatively affecting your baby.  Drink milk, fruit juice, and water to satisfy your thirst (about 10 glasses a day).   Rest often, relax, and continue to take your prenatal vitamins to prevent fatigue, stress, and anemia.  Continue breast self-awareness checks.  Avoid chewing and smoking tobacco.  Avoid alcohol and drug use. Some medicines that may be harmful to your baby can pass through breast milk. It is important to ask your health care provider before taking any medicine, including all over-the-counter and prescription medicine as well as vitamin and herbal supplements. It is possible to become pregnant while breastfeeding. If birth control is desired, ask your health care provider about options that will be safe for your baby. SEEK MEDICAL CARE IF:   You feel like you want to stop breastfeeding or have become frustrated with breastfeeding.  You have painful breasts or nipples.  Your nipples are cracked or bleeding.  Your breasts are red, tender, or warm.  You have a swollen area on either breast.  You have a fever or chills.  You have nausea or vomiting.  You have drainage other than breast milk from your nipples.  Your breasts  do not become full before feedings by the 5th day after you give birth.  You feel sad and depressed.  Your baby is too sleepy to eat well.  Your baby is having trouble sleeping.   Your baby is wetting less than 3 diapers in a 24-hour period.  Your baby has less than 3 stools in a 24-hour period.  Your baby's skin or the white part of his or her eyes becomes yellow.   Your baby is not gaining weight by 5 days of age. SEEK IMMEDIATE MEDICAL CARE IF:   Your baby is overly tired (lethargic) and does not want to wake up and feed.  Your baby develops an unexplained fever. Document Released: 01/29/2005 Document Revised: 10/01/2012 Document Reviewed: 07/23/2012 [employer] Patient Information 2014 [employer].  

## 2013-06-25 NOTE — Progress Notes (Signed)
D/C summary from neurosurgery placed here for reference had an L5-S1, S2 laminectomy for decompression--Neurosurgery also cleared her for epidural--see notes in spina bifida problem--can also find this in [place].  Need interstim placement records--will try to obtain--Urology in [place] and not on [facility] NST reviewed and reactive. GBS positive in urine.  DC Summary DATE OF ADMISSION: 07/07/2010.  DATE OF DISCHARGE: 07/08/2010.  ADMITTING DIAGNOSIS: Tethered cord.  DISCHARGE DIAGNOSIS: Tethered cord.  ADMITTING SERVICE: Neurosurgery.  ADMITTING ATTENDING: Atilano Ina, M.D.  PROCEDURES PERFORMED: Tethered cord release via laminectomy at L5-S1.  CONSULTING SERVICES: None.  HISTORY OF PRESENT ILLNESS: Ms. Hayley Calhoun is a pleasant 28 year old who has had longstanding history of bilateral leg pain, numbness and tingling. 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 She was taken to the OR and had a L5-S1 and S2 laminectomy for decompression. This was done without issue. The dura was closed primarily and dry gel foam was left to help prevent CSF leak. Today, she was seen postoperatively, her pain is fairly significant and she was not tolerating out of bed activity very well. Secondary to this, it came to our attention that she had not been offered the spasmolytics she had ordered. These were given and she enjoyed good resolution of her pain as she mobilized. She was without clinical evidence of CSF leak or any complaints of low pressure headaches at the time of discharge. Her incision was found to be clean, dry and intact without signs of drainage or infection. The patient's motor exam at the time of discharge was found to be fully intact with 5/5 strength in all groups in her bilateral lower extremities.  DISCHARGE DISPOSITION: Home.  DISCHARGE  DIET: Regular.  DISCHARGE CONDITION: Good.  DISCHARGE FOLLOWUP: The patient is to follow up with Dr. Prince Rome in two weeks.  DISCHARGE INSTRUCTIONS: The patient was advised of signs and symptoms of spinal fluid leakage, both from the wound and with respect to positional headaches. She was advised if she experienced any of these symptoms to return either to the clinic or the emergency room right away. She was also advised to avoid any high risk or high impact activities for a period of one month.  Dictated by: Jennings Books, MD

## 2013-06-25 NOTE — Progress Notes (Signed)
ROI signed for records from [facility] in [place].

## 2013-06-26 LAB — GC/CHLAMYDIA PROBE AMP
CT Probe RNA: NEGATIVE
GC PROBE AMP APTIMA: NEGATIVE

## 2013-06-28 ENCOUNTER — Encounter (HOSPITAL_COMMUNITY): Payer: Self-pay | Admitting: *Deleted

## 2013-06-28 ENCOUNTER — Inpatient Hospital Stay (HOSPITAL_COMMUNITY)
Admission: AD | Admit: 2013-06-28 | Discharge: 2013-06-28 | Disposition: A | Discharge status: Home / Self Care | Payer: Medicaid Other | Source: Ambulatory Visit | Attending: Obstetrics & Gynecology | Admitting: Obstetrics & Gynecology

## 2013-06-28 DIAGNOSIS — O4700 False labor before 37 completed weeks of gestation, unspecified trimester: Secondary | ICD-10-CM | POA: Insufficient documentation

## 2013-06-28 DIAGNOSIS — O2620 Pregnancy care for patient with recurrent pregnancy loss, unspecified trimester: Secondary | ICD-10-CM

## 2013-06-28 DIAGNOSIS — O0990 Supervision of high risk pregnancy, unspecified, unspecified trimester: Secondary | ICD-10-CM

## 2013-06-28 LAB — CULTURE, OB URINE: Colony Count: >=100000

## 2013-06-28 NOTE — MAU Note (Signed)
PT SAYS SHE STARTED HURTING BAD AT   0500.   [facility]-- DOWNSTAIRS-  VE  ON Thursday  2 CM.Marland Kitchen   DENIES HSV AND MRSA.  GBS- NEG

## 2013-06-29 ENCOUNTER — Telehealth: Payer: Self-pay | Admitting: *Deleted

## 2013-06-29 ENCOUNTER — Inpatient Hospital Stay (HOSPITAL_COMMUNITY)
Admission: AD | Admit: 2013-06-29 | Discharge: 2013-06-29 | Disposition: A | Discharge status: Home / Self Care | Payer: Medicaid Other | Source: Ambulatory Visit | Attending: Obstetrics & Gynecology | Admitting: Obstetrics & Gynecology

## 2013-06-29 ENCOUNTER — Encounter: Payer: Self-pay | Admitting: *Deleted

## 2013-06-29 ENCOUNTER — Encounter (HOSPITAL_COMMUNITY): Payer: Self-pay | Admitting: *Deleted

## 2013-06-29 ENCOUNTER — Other Ambulatory Visit: Payer: Medicaid Other

## 2013-06-29 DIAGNOSIS — O0990 Supervision of high risk pregnancy, unspecified, unspecified trimester: Secondary | ICD-10-CM

## 2013-06-29 DIAGNOSIS — O2620 Pregnancy care for patient with recurrent pregnancy loss, unspecified trimester: Secondary | ICD-10-CM

## 2013-06-29 DIAGNOSIS — N390 Urinary tract infection, site not specified: Secondary | ICD-10-CM

## 2013-06-29 DIAGNOSIS — O4700 False labor before 37 completed weeks of gestation, unspecified trimester: Secondary | ICD-10-CM | POA: Insufficient documentation

## 2013-06-29 MED ORDER — NITROFURANTOIN MONOHYD MACRO 100 MG PO CAPS: 100.0000 mg | ORAL_CAPSULE | Freq: Two times a day (BID) | ORAL | Status: DC

## 2013-06-29 NOTE — Discharge Instructions (Signed)
Third Trimester of Pregnancy The third trimester is from week 29 through week 42, months 7 through 9. This trimester is when your unborn baby (fetus) is growing very fast. At the end of the ninth month, the unborn baby is about 20 inches in length. It weighs about 6 10 pounds.  HOME CARE   Avoid all smoking, herbs, and alcohol. Avoid drugs not approved by your doctor.  Only take medicine as told by your doctor. Some medicines are safe and some are not during pregnancy.  Exercise only as told by your doctor. Stop exercising if you start having cramps.  Eat regular, healthy meals.  Wear a good support bra if your breasts are tender.  Do not use hot tubs, steam rooms, or saunas.  Wear your seat belt when driving.  Avoid raw meat, uncooked cheese, and liter boxes and soil used by cats.  Take your prenatal vitamins.  Try taking medicine that helps you poop (stool softener) as needed, and if your doctor approves. Eat more fiber by eating fresh fruit, vegetables, and whole grains. Drink enough fluids to keep your pee (urine) clear or pale yellow.  Take warm water baths (sitz baths) to soothe pain or discomfort caused by hemorrhoids. Use hemorrhoid cream if your doctor approves.  If you have puffy, bulging veins (varicose veins), wear support hose. Raise (elevate) your feet for 15 minutes, 3 4 times a day. Limit salt in your diet.  Avoid heavy lifting, wear low heels, and sit up straight.  Rest with your legs raised if you have leg cramps or low back pain.  Visit your dentist if you have not gone during your pregnancy. Use a soft toothbrush to brush your teeth. Be gentle when you floss.  You can have sex (intercourse) unless your doctor tells you not to.  Do not travel far distances unless you must. Only do so with your doctor's approval.  Take prenatal classes.  Practice driving to the hospital.  Pack your hospital bag.  Prepare the baby's room.  Go to your doctor visits. GET  HELP IF:  You are not sure if you are in labor or if your water has broken.  You are dizzy.  You have mild cramps or pressure in your lower belly (abdominal).  You have a nagging pain in your belly area.  You continue to feel sick to your stomach (nauseous), throw up (vomit), or have watery poop (diarrhea).  You have bad smelling fluid coming from your vagina.  You have pain with peeing (urination). GET HELP RIGHT AWAY IF:   You have a fever.  You are leaking fluid from your vagina.  You are spotting or bleeding from your vagina.  You have severe belly cramping or pain.  You lose or gain weight rapidly.  You have trouble catching your breath and have chest pain.  You notice sudden or extreme puffiness (swelling) of your face, hands, ankles, feet, or legs.  You have not felt the baby move in over an hour.  You have severe headaches that do not go away with medicine.  You have vision changes. Document Released: 04/25/2009 Document Revised: 05/26/2012 Document Reviewed: 04/01/2012 [facility] Patient Information 2014 [place], [place].

## 2013-06-29 NOTE — MAU Note (Signed)
Patient will be evaluated in 166 in BS. Report given to [facility], CN.

## 2013-06-29 NOTE — MAU Provider Note (Signed)
Chief Complaint:  Labor Eval   Hayley Calhoun is a 28 y.o.  [phone] with IUP at [redacted]w[redacted]d presenting for Labor Eval Pt was here for evaluation of rupture of membranes. Pt was having emesis last night and had a gush with emesis. Resolved and no recurrence. Pt is having nonpainful contractions. And has good fetal movement no VB. No other complints  Of note pt has hx of bladder retention and self caths.  Menstrual History: OB History   Grav Para Term Preterm Abortions TAB SAB Ect Mult Living   9 2 2  0 6 0 6 0 0 2       Patient's last menstrual period was 10/15/2012.      Past Medical History  Diagnosis Date  . Spina bifida   . Bladder disorder   . Kidney stone   . UTI (lower urinary tract infection)   . Kidney stones   . Pregnant     Past Surgical History  Procedure Laterality Date  . Spina bifida    . Lithotripsy    . Bladder botox    . Interstim implant placement    . Wisdom tooth extraction      Family History  Problem Relation Age of Onset  . Other Neg Hx   . Scoliosis Mother     History  Substance Use Topics  . Smoking status: Never Smoker   . Smokeless tobacco: Never Used  . Alcohol Use: No      Allergies  Allergen Reactions  . Sulfa Antibiotics Shortness Of Breath, Itching and Other (See Comments)    Itching and burning in chest; and could not breathe.  . Keflex [Cephalexin] Nausea And Vomiting  . Nitrofurantoin Monohyd Macro Nausea And Vomiting  . Penicillins Other (See Comments)    Childhood reaction unknown    Facility-administered medications prior to admission  Medication Dose Route Frequency Provider Last Rate Last Dose  . Tdap (BOOSTRIX) injection 0.5 mL  0.5 mL Intramuscular Once Walidah N Muhammad, CNM       Prescriptions prior to admission  Medication Sig Dispense Refill  . acetaminophen (TYLENOL) 325 MG tablet Take 650 mg by mouth every 6 (six) hours as needed.      . Fe Fum-FePoly-FA-Vit C-Vit B3 (INTEGRA F) 125-1 MG CAPS Take 1  tablet by mouth daily.  30 capsule  1    Review of Systems - Negative except for what is mentioned in HPI.  Physical Exam  Blood pressure 131/82, pulse 92, temperature 98.5 F (36.9 C), temperature source Oral, resp. rate 16, height 5\' 3"  (1.6 m), weight 71.668 kg (158 lb), last menstrual period 10/15/2012, SpO2 99.00%. GENERAL: Well-developed, well-nourished female in no acute distress.  LUNGS: Clear to auscultation bilaterally.  HEART: Regular rate and rhythm. ABDOMEN: Soft, nontender, nondistended, gravid.  EXTREMITIES: Nontender, no edema, 2+ distal pulses. Cervical Exam: Dilatation 1.5cm   Effacement thick    Station -3   SSE neg pooling, minimal white mucous, neg cough Presentation: cephalic FHT:  Baseline rate 145 bpm   Variability moderate  Accelerations: present   Decelerations none Contractions: Every 4-5 mins   Labs: No results found for this or any previous visit (from the past 24 hour(s)).  Imaging Studies:  US Ob Follow Up  06/08/2013   OBSTETRICAL ULTRASOUND: This exam was performed within a [facility]. The OB US report was generated in the AS system, and faxed to the ordering physician.   This report is also available in [employer] and in the [employer]. See AS Obstetric US report.   Assessment: Hayley Calhoun is  28 y.o. [id number] at [redacted]w[redacted]d presents with Labor Eval .  Plan: [phone] with IUP at [redacted]w[redacted]d presenting for ROM. Neg evaluation. Likely urinary incontence 2/2 retention and emesis - Fern slide negative  - DC to home - appt scheduled for today, will have her call with the results of today   - F/u on 5/21 with next scheduled appt.    Rosemarie Ax 5/18/201510:02 AM  I spoke with and examined patient and agree with resident's note and plan of care.  Fredrik Rigger, MD OB Fellow 06/29/2013 11:15 AM

## 2013-06-29 NOTE — Telephone Encounter (Signed)
Message copied by Sue Lush on Mon Jun 29, 2013 10:21 AM ------      Message from: Donnamae Jude      Created: Mon Jun 29, 2013  8:30 AM       Please treat with macrobid 100 mg bid x 7 d # 14 , no rf ------

## 2013-06-29 NOTE — OB Triage Note (Signed)
Discharge instructions given and discussed with patient. Patient verbalized understanding. All questions answered. Pt discharged home.

## 2013-06-29 NOTE — MAU Provider Note (Signed)
Attestation of Attending Supervision of Fellow: Evaluation and management procedures were performed by the Fellow under my supervision and collaboration.  I have reviewed the Fellow's note and chart, and I agree with the management and plan.    

## 2013-06-29 NOTE — Telephone Encounter (Signed)
Attempted to call patient, no answer (line invalid), attempted to call emergency contact (wrong number).  Will send certified letter.  Medication ordered, certified letter sent.

## 2013-06-29 NOTE — Discharge Summary (Signed)
Entered in error

## 2013-06-29 NOTE — MAU Note (Signed)
Patient states she she was seen in MAU yesterday for labor evaluation. States after she left she had yellow/clearish fluid leak but has had none today. States she is having a lot of pelvic pressure. Denies bleeding or leaking today. Reports good fetal movement.

## 2013-07-02 ENCOUNTER — Ambulatory Visit (INDEPENDENT_AMBULATORY_CARE_PROVIDER_SITE_OTHER): Payer: Medicaid Other | Admitting: Obstetrics & Gynecology

## 2013-07-02 VITALS — BP 111/77 | HR 100 | Wt 156.8 lb

## 2013-07-02 DIAGNOSIS — O0990 Supervision of high risk pregnancy, unspecified, unspecified trimester: Secondary | ICD-10-CM

## 2013-07-02 DIAGNOSIS — O10019 Pre-existing essential hypertension complicating pregnancy, unspecified trimester: Secondary | ICD-10-CM

## 2013-07-02 NOTE — Patient Instructions (Signed)
Return to clinic for any obstetric concerns or go to [facility] for evaluation  

## 2013-07-02 NOTE — Progress Notes (Signed)
NST

## 2013-07-02 NOTE — Progress Notes (Signed)
NST performed today was reviewed and was found to be reactive.  Continue recommended antenatal testing and prenatal care. No cervical change. Preeclampsia, fetal movement and labor precautions reviewed. Growth scan next week.  No urine sample left today.

## 2013-07-07 ENCOUNTER — Ambulatory Visit (HOSPITAL_COMMUNITY)
Admission: RE | Admit: 2013-07-07 | Discharge: 2013-07-07 | Disposition: A | Discharge status: Home / Self Care | Payer: Medicaid Other | Source: Ambulatory Visit | Attending: Obstetrics & Gynecology | Admitting: Obstetrics & Gynecology

## 2013-07-07 ENCOUNTER — Encounter (HOSPITAL_COMMUNITY): Payer: Self-pay

## 2013-07-07 DIAGNOSIS — O10019 Pre-existing essential hypertension complicating pregnancy, unspecified trimester: Secondary | ICD-10-CM | POA: Insufficient documentation

## 2013-07-09 ENCOUNTER — Ambulatory Visit (INDEPENDENT_AMBULATORY_CARE_PROVIDER_SITE_OTHER): Payer: Medicaid Other | Admitting: Obstetrics & Gynecology

## 2013-07-09 ENCOUNTER — Ambulatory Visit (HOSPITAL_COMMUNITY)
Admission: RE | Admit: 2013-07-09 | Discharge: 2013-07-09 | Disposition: A | Discharge status: Home / Self Care | Payer: Medicaid Other | Source: Ambulatory Visit | Attending: Obstetrics & Gynecology | Admitting: Obstetrics & Gynecology

## 2013-07-09 VITALS — BP 128/76 | HR 92 | Wt 158.4 lb

## 2013-07-09 DIAGNOSIS — O10912 Unspecified pre-existing hypertension complicating pregnancy, second trimester: Secondary | ICD-10-CM

## 2013-07-09 DIAGNOSIS — O0990 Supervision of high risk pregnancy, unspecified, unspecified trimester: Secondary | ICD-10-CM

## 2013-07-09 DIAGNOSIS — O10019 Pre-existing essential hypertension complicating pregnancy, unspecified trimester: Secondary | ICD-10-CM | POA: Insufficient documentation

## 2013-07-09 DIAGNOSIS — N390 Urinary tract infection, site not specified: Secondary | ICD-10-CM | POA: Insufficient documentation

## 2013-07-09 DIAGNOSIS — Z3689 Encounter for other specified antenatal screening: Secondary | ICD-10-CM | POA: Insufficient documentation

## 2013-07-09 LAB — POCT URINALYSIS DIP (DEVICE)
Bilirubin Urine: NEGATIVE
GLUCOSE, UA: NEGATIVE mg/dL
Hgb urine dipstick: NEGATIVE
Ketones, ur: NEGATIVE mg/dL
NITRITE: NEGATIVE
Protein, ur: NEGATIVE mg/dL
Specific Gravity, Urine: 1.02 (ref 1.005–1.030)
Urobilinogen, UA: 0.2 mg/dL (ref 0.0–1.0)
pH: 6.5 (ref 5.0–8.0)

## 2013-07-09 NOTE — Patient Instructions (Signed)
Return to clinic for any obstetric concerns or go to [facility] for evaluation  

## 2013-07-09 NOTE — Progress Notes (Signed)
Patient reports pelvic pressure and cramps this morning with contractions. Also reports  A lot of pain on the side she had her surgery on.

## 2013-07-09 NOTE — Progress Notes (Signed)
NST performed today was reviewed and was found to be reactive.  Continue recommended antenatal testing and prenatal care.  Growth scan today, will follow up results.  Normal BP, no meds, can go to 40 weeks unless any concerns arise. Declines trial of Flexeril for her back pain, will continue to monitor. Tylenol and warm compresses recommended. No other complaints or concerns.  Fetal movement and labor precautions reviewed.

## 2013-07-10 ENCOUNTER — Encounter: Payer: Self-pay | Admitting: General Practice

## 2013-07-13 ENCOUNTER — Encounter (HOSPITAL_COMMUNITY): Payer: Medicaid Other | Admitting: Anesthesiology

## 2013-07-13 ENCOUNTER — Inpatient Hospital Stay (HOSPITAL_COMMUNITY)
Admission: AD | Admit: 2013-07-13 | Discharge: 2013-07-13 | Disposition: A | Discharge status: Home / Self Care | Payer: Medicaid Other | Source: Ambulatory Visit | Attending: Obstetrics and Gynecology | Admitting: Obstetrics and Gynecology

## 2013-07-13 ENCOUNTER — Inpatient Hospital Stay (HOSPITAL_COMMUNITY)
Admission: AD | Admit: 2013-07-13 | Discharge: 2013-07-15 | DRG: 775 | Disposition: A | Discharge status: Home / Self Care | Payer: Medicaid Other | Source: Ambulatory Visit | Attending: Obstetrics & Gynecology | Admitting: Obstetrics & Gynecology

## 2013-07-13 ENCOUNTER — Inpatient Hospital Stay (HOSPITAL_COMMUNITY): Payer: Medicaid Other | Admitting: Anesthesiology

## 2013-07-13 ENCOUNTER — Encounter (HOSPITAL_COMMUNITY): Payer: Self-pay | Admitting: *Deleted

## 2013-07-13 ENCOUNTER — Encounter (HOSPITAL_COMMUNITY): Payer: Self-pay

## 2013-07-13 ENCOUNTER — Other Ambulatory Visit: Payer: Medicaid Other

## 2013-07-13 DIAGNOSIS — Z22330 Carrier of Group B streptococcus: Secondary | ICD-10-CM | POA: Diagnosis not present

## 2013-07-13 DIAGNOSIS — O2620 Pregnancy care for patient with recurrent pregnancy loss, unspecified trimester: Secondary | ICD-10-CM

## 2013-07-13 DIAGNOSIS — O0990 Supervision of high risk pregnancy, unspecified, unspecified trimester: Secondary | ICD-10-CM

## 2013-07-13 DIAGNOSIS — Q059 Spina bifida, unspecified: Secondary | ICD-10-CM

## 2013-07-13 DIAGNOSIS — O99892 Other specified diseases and conditions complicating childbirth: Secondary | ICD-10-CM | POA: Diagnosis present

## 2013-07-13 DIAGNOSIS — O479 False labor, unspecified: Secondary | ICD-10-CM | POA: Insufficient documentation

## 2013-07-13 DIAGNOSIS — O9989 Other specified diseases and conditions complicating pregnancy, childbirth and the puerperium: Secondary | ICD-10-CM

## 2013-07-13 DIAGNOSIS — IMO0001 Reserved for inherently not codable concepts without codable children: Secondary | ICD-10-CM

## 2013-07-13 DIAGNOSIS — Z87442 Personal history of urinary calculi: Secondary | ICD-10-CM | POA: Diagnosis not present

## 2013-07-13 DIAGNOSIS — N289 Disorder of kidney and ureter, unspecified: Secondary | ICD-10-CM | POA: Diagnosis present

## 2013-07-13 DIAGNOSIS — O26839 Pregnancy related renal disease, unspecified trimester: Secondary | ICD-10-CM | POA: Diagnosis not present

## 2013-07-13 LAB — CBC
HCT: 34 % — ABNORMAL LOW (ref 36.0–46.0)
HEMOGLOBIN: 11.9 g/dL — AB (ref 12.0–15.0)
MCH: 24.1 pg — AB (ref 26.0–34.0)
MCHC: 35 g/dL (ref 30.0–36.0)
MCV: 69 fL — AB (ref 78.0–100.0)
Platelets: 246 10*3/uL (ref 150–400)
RBC: 4.93 MIL/uL (ref 3.87–5.11)
RDW: 17.5 % — ABNORMAL HIGH (ref 11.5–15.5)
WBC: 10.1 10*3/uL (ref 4.0–10.5)

## 2013-07-13 MED ORDER — EPHEDRINE 5 MG/ML INJ
10.0000 mg | INTRAVENOUS | Status: DC | PRN
  Filled 2013-07-13: qty 2

## 2013-07-13 MED ORDER — LACTATED RINGERS IV SOLN: 500.0000 mL | INTRAVENOUS | Status: DC | PRN

## 2013-07-13 MED ORDER — PENICILLIN G POTASSIUM 5000000 UNITS IJ SOLR
5.0000 10*6.[IU] | Freq: Once | INTRAVENOUS | Status: AC
  Administered 2013-07-13: 5 10*6.[IU] via INTRAVENOUS
  Filled 2013-07-13: qty 5

## 2013-07-13 MED ORDER — LACTATED RINGERS IV SOLN
INTRAVENOUS | Status: DC
Start: 2013-07-13 — End: 2013-07-14
  Administered 2013-07-13 (×3): via INTRAVENOUS

## 2013-07-13 MED ORDER — OXYTOCIN 40 UNITS IN LACTATED RINGERS INFUSION - SIMPLE MED
1.0000 m[IU]/min | INTRAVENOUS | Status: DC
Start: 2013-07-13 — End: 2013-07-14
  Administered 2013-07-13: 2 m[IU]/min via INTRAVENOUS
  Filled 2013-07-13: qty 1000

## 2013-07-13 MED ORDER — DIPHENHYDRAMINE HCL 50 MG/ML IJ SOLN: 12.5000 mg | INTRAMUSCULAR | Status: DC | PRN

## 2013-07-13 MED ORDER — IBUPROFEN 600 MG PO TABS: 600.0000 mg | ORAL_TABLET | Freq: Four times a day (QID) | ORAL | Status: DC | PRN

## 2013-07-13 MED ORDER — LIDOCAINE HCL (PF) 1 % IJ SOLN
INTRAMUSCULAR | Status: DC | PRN
  Administered 2013-07-13 (×4): 4 mL

## 2013-07-13 MED ORDER — CITRIC ACID-SODIUM CITRATE 334-500 MG/5ML PO SOLN: 30.0000 mL | ORAL | Status: DC | PRN

## 2013-07-13 MED ORDER — ACETAMINOPHEN 325 MG PO TABS: 650.0000 mg | ORAL_TABLET | ORAL | Status: DC | PRN

## 2013-07-13 MED ORDER — PHENYLEPHRINE 40 MCG/ML (10ML) SYRINGE FOR IV PUSH (FOR BLOOD PRESSURE SUPPORT)
80.0000 ug | PREFILLED_SYRINGE | INTRAVENOUS | Status: DC | PRN
  Filled 2013-07-13: qty 2

## 2013-07-13 MED ORDER — PROMETHAZINE HCL 25 MG/ML IJ SOLN
25.0000 mg | Freq: Once | INTRAMUSCULAR | Status: AC
  Administered 2013-07-13: 25 mg via INTRAMUSCULAR
  Filled 2013-07-13: qty 1

## 2013-07-13 MED ORDER — PHENYLEPHRINE 40 MCG/ML (10ML) SYRINGE FOR IV PUSH (FOR BLOOD PRESSURE SUPPORT)
80.0000 ug | PREFILLED_SYRINGE | INTRAVENOUS | Status: DC | PRN
  Filled 2013-07-13: qty 10
  Filled 2013-07-13: qty 2

## 2013-07-13 MED ORDER — FENTANYL 2.5 MCG/ML BUPIVACAINE 1/10 % EPIDURAL INFUSION (WH - ANES)
14.0000 mL/h | INTRAMUSCULAR | Status: DC | PRN
  Administered 2013-07-13: 14 mL/h via EPIDURAL
  Filled 2013-07-13: qty 125

## 2013-07-13 MED ORDER — LACTATED RINGERS IV SOLN
500.0000 mL | Freq: Once | INTRAVENOUS | Status: AC
  Administered 2013-07-13: 500 mL via INTRAVENOUS

## 2013-07-13 MED ORDER — PENICILLIN G POTASSIUM 5000000 UNITS IJ SOLR
2.5000 10*6.[IU] | INTRAVENOUS | Status: DC
  Filled 2013-07-13 (×5): qty 2.5

## 2013-07-13 MED ORDER — EPHEDRINE 5 MG/ML INJ
10.0000 mg | INTRAVENOUS | Status: DC | PRN
  Filled 2013-07-13: qty 2
  Filled 2013-07-13: qty 4

## 2013-07-13 MED ORDER — OXYCODONE-ACETAMINOPHEN 5-325 MG PO TABS: 1.0000 | ORAL_TABLET | ORAL | Status: DC | PRN

## 2013-07-13 MED ORDER — LIDOCAINE HCL (PF) 1 % IJ SOLN
30.0000 mL | INTRAMUSCULAR | Status: DC | PRN
  Filled 2013-07-13: qty 30

## 2013-07-13 MED ORDER — TERBUTALINE SULFATE 1 MG/ML IJ SOLN: 0.2500 mg | Freq: Once | INTRAMUSCULAR | Status: AC | PRN

## 2013-07-13 MED ORDER — OXYTOCIN 40 UNITS IN LACTATED RINGERS INFUSION - SIMPLE MED: 62.5000 mL/h | INTRAVENOUS | Status: DC

## 2013-07-13 MED ORDER — OXYTOCIN BOLUS FROM INFUSION: 500.0000 mL | INTRAVENOUS | Status: DC

## 2013-07-13 MED ORDER — FENTANYL CITRATE 0.05 MG/ML IJ SOLN: 100.0000 ug | INTRAMUSCULAR | Status: DC | PRN

## 2013-07-13 MED ORDER — ONDANSETRON HCL 4 MG/2ML IJ SOLN: 4.0000 mg | Freq: Four times a day (QID) | INTRAMUSCULAR | Status: DC | PRN

## 2013-07-13 NOTE — MAU Note (Signed)
PT  SAYS SHE STARTED HURTING BAD AT   [place]-   GBS-  NEG.     2 WEEKS AGO-  VE  1-2 CM.    DENIES HSV AND MRSA.

## 2013-07-13 NOTE — Anesthesia Procedure Notes (Signed)
Epidural Patient location during procedure: OB Start time: 07/13/2013 7:33 PM  Staffing Performed by: anesthesiologist   Preanesthetic Checklist Completed: patient identified, site marked, surgical consent, pre-op evaluation, timeout performed, IV checked, risks and benefits discussed and monitors and equipment checked  Epidural Patient position: sitting Prep: site prepped and draped and DuraPrep Patient monitoring: continuous pulse ox and blood pressure Approach: midline Injection technique: LOR air  Needle:  Needle type: Tuohy  Needle gauge: 17 G Needle length: 9 cm and 9 Needle insertion depth: 5 cm cm Catheter type: closed end flexible Catheter size: 19 Gauge Catheter at skin depth: 10 cm Test dose: negative  Assessment Events: blood not aspirated, injection not painful, no injection resistance, negative IV test and no paresthesia  Additional Notes Discussed risk of headache, infection, bleeding, nerve injury and failed or incomplete block.  Discussed potential for patchy/incomplete block with h/o prior spinal surgery/laminectomy in L5-S2 area.  Patient voices understanding and wishes to proceed.  Epidural placed easily on first attempt.  No paresthesia.  Patient tolerated procedure well with no apparent complications.  Charlton Haws, MDReason for block:procedure for pain

## 2013-07-13 NOTE — MAU Note (Signed)
Patient states she is having frequent contractions, no bleeding or leaking and reports good fetal movement. Patient was sent from [facility] for labor eval.

## 2013-07-13 NOTE — MAU Note (Signed)
Pt states has to self cath and has severe pain in urethral area. Does use lidoderm jelly. Here from [facility], told office she was having ctx's and was sent here for further eval. Denies bleeding or otherwise abnormal vaginal discharge.

## 2013-07-13 NOTE — H&P (Signed)
Hayley Calhoun is a 28 y.o. female [phone] with IUP at [redacted]w[redacted]d presenting for painful contractions and cervical change in MAu from 3-4. Discussed options with patient and requests to be admitted for pain control. Pt states she has been having regular, every 5 minutes contractions, associated with none vaginal bleeding.  Membranes are intact, with active fetal movement.   P[facility]are at [facility] since 12 wks  Prenatal History/Complications: Hx of recurrent UTI GBS+ Spina bifida surgery - reports Neurosurgeon OK to have epidural per Dr. Prince Rome - Neurosurgeon at [place] retention - self cath   Past Medical History: Past Medical History  Diagnosis Date  . Spina bifida   . Bladder disorder   . Kidney stone   . UTI (lower urinary tract infection)   . Kidney stones   . Pregnant     Past Surgical History: Past Surgical History  Procedure Laterality Date  . Spina bifida    . Lithotripsy    . Bladder botox    . Interstim implant placement    . Wisdom tooth extraction      Obstetrical History: OB History   Grav Para Term Preterm Abortions TAB SAB Ect Mult Living   9 2 2  0 6 0 6 0 0 2      Gynecological History: OB History   Grav Para Term Preterm Abortions TAB SAB Ect Mult Living   9 2 2  0 6 0 6 0 0 2      Social History: History   Social History  . Marital Status: Married    Spouse Name: N/A    Number of Children: N/A  . Years of Education: N/A   Social History Main Topics  . Smoking status: Never Smoker   . Smokeless tobacco: Never Used  . Alcohol Use: No  . Drug Use: No  . Sexual Activity: Yes    Birth Control/ Protection: None   Other Topics Concern  . None   Social History Narrative  . None    Family History: Family History  Problem Relation Age of Onset  . Other Neg Hx   . Scoliosis Mother     Allergies: Allergies  Allergen Reactions  . Sulfa Antibiotics Shortness Of Breath, Itching and Other (See Comments)    Itching and burning in chest; and  could not breathe.  . Keflex [Cephalexin] Nausea And Vomiting  . Nitrofurantoin Monohyd Macro Nausea And Vomiting  . Penicillins Other (See Comments)    Childhood reaction unknown    Prescriptions prior to admission  Medication Sig Dispense Refill  . acetaminophen (TYLENOL) 325 MG tablet Take 650 mg by mouth every 6 (six) hours as needed for mild pain or moderate pain.       . Fe Fum-FePoly-FA-Vit C-Vit B3 (INTEGRA F) 125-1 MG CAPS Take 1 tablet by mouth daily.  30 capsule  1  . Multiple Vitamin (MULTIVITAMIN) tablet Take 1 tablet by mouth daily.         Review of Systems   Constitutional: No other complaints  Blood pressure 129/79, pulse 100, resp. rate 20, height 5\' 4"  (1.626 m), weight 72.576 kg (160 lb), last menstrual period 10/15/2012, SpO2 100.00%. General appearance: alert, cooperative, appears stated age and no distress Lungs: clear to auscultation bilaterally Heart: regular rate and rhythm Abdomen: soft, non-tender; bowel sounds normal Pelvic: adequate to 8'10 Extremities: Homans sign is negative, no sign of DVT DTR's 2+ Presentation: cephalic Fetal monitoringBaseline: 150s bpm, Variability: Good {> 6 bpm), Accelerations: Reactive and Decelerations: Absent  Uterine activity q5+min Dilation: 4 Effacement (%): 70 Station: -3 Exam by:: SBeck, RN   Prenatal labs: ABO, Rh: O/POS/-- (11/06 0948) Antibody: NEG (11/06 0948) Rubella:   RPR: NON REAC (03/12 0853)  HBsAg: NEGATIVE (11/06 0948)  HIV: NON REACTIVE (03/12 0853)  GBS:   + [facility]  Dating LMP.  Consistent with 12 week [place] at [place] NT - Normal; AFP neg  Anatomic [place] MFM nml at 18 wks  Glucose Screen 112  GBS In urine--needs rx in labor  Feeding Preference  Breast  Contraception BTL--signed papers  Circumcision female  Pap  Femina nml  Pediatrician: [facility]  Support Person: Husband   Prenatal Transfer Tool  Maternal Diabetes: No Genetic  Screening: Normal Maternal Ultrasounds/Referrals: Normal Fetal Ultrasounds or other Referrals:  None Maternal Substance Abuse:  No Significant Maternal Medications:  None Significant Maternal Lab Results: Lab values include: Group B Strep positive     No results found for this or any previous visit (from the past 24 hour(s)).  Assessment: Hayley Calhoun is a 28 y.o. [id number] at [redacted]w[redacted]d by L=12 here for SOOL #Labor:early latent labor - rare contractions. Will monitor patient, if no cervical change over next 4 hours may discharge. However hx of rapid labor and will hopefully make change #Pain: IM or IV fentanyl. Epidural once proven change #FWB: Cat I #ID:  GBS+, PCN - allergy nausea - will observe for reaction #MOF: Breast #MOC: BTL #Circ:  Female  Allen Norris 07/13/2013, 5:35 PM

## 2013-07-13 NOTE — Progress Notes (Signed)
Dr. Leslie Andrea notified of pt's exam, will discuss options with pt and call back.

## 2013-07-13 NOTE — MAU Note (Signed)
Dr. Leslie Andrea on unit, pt prefers to stay here, provider to talk to pt.

## 2013-07-13 NOTE — Progress Notes (Signed)
ANET LOGSDON is a 28 y.o. [phone] at [redacted]w[redacted]d admitted for active labor  Subjective: Comfortable with epidural   Objective: BP 123/83  Pulse 84  Temp(Src) 98.8 F (37.1 C) (Oral)  Resp 18  Ht 5\' 4"  (1.626 m)  Wt 72.576 kg (160 lb)  BMI 27.45 kg/m2  SpO2 100%  LMP 10/15/2012      FHT:  FHR: 150 bpm, variability: moderate,  accelerations:  Present,  decelerations:  Absent UC:   regular, every 2-3 minutes SVE:   Dilation: 8 Effacement (%): 90 Station: -1 Exam by:: Leanne Carpenter,RN  Labs: Lab Results  Component Value Date   WBC 10.1 07/13/2013   HGB 11.9* 07/13/2013   HCT 34.0* 07/13/2013   MCV 69.0* 07/13/2013   PLT 246 07/13/2013    Assessment / Plan: Spontaneous labor, progressing normally  Labor: on pit Preeclampsia:  n/a Fetal Wellbeing:  Category I Pain Control:  Epidural I/D:  GBS + on PCN Anticipated MOD:  NSVD  Rosemarie Ax 07/13/2013, 11:11 PM

## 2013-07-13 NOTE — Progress Notes (Signed)
Hayley Calhoun is a 28 y.o. [phone] at [redacted]w[redacted]d admitted for active labor  Subjective: Pt with epidural - comfortable  Objective: BP 121/80  Pulse 98  Temp(Src) 98.6 F (37 C)  Resp 18  Ht 5\' 4"  (1.626 m)  Wt 72.576 kg (160 lb)  BMI 27.45 kg/m2  SpO2 100%  LMP 10/15/2012      FHT:  FHR: 150s bpm, variability: moderate,  accelerations:  Present,  decelerations:  Absent UC:   irregular, every 5-7 minutes SVE:   Dilation: 5 Effacement (%): 30 Station: -3 Exam by:: Sowder, RNC  Labs: Lab Results  Component Value Date   WBC 10.1 07/13/2013   HGB 11.9* 07/13/2013   HCT 34.0* 07/13/2013   MCV 69.0* 07/13/2013   PLT 246 07/13/2013    Assessment / Plan: Spontaneous labor, spacing of ctx will augment with pitocin  Labor: augment on pitocin Preeclampsia:  no signs or symptoms of toxicity Fetal Wellbeing:  Category I Pain Control:  Epidural I/D:  GBS + on PCN Anticipated MOD:  NSVD  Allen Norris 07/13/2013, 8:21 PM

## 2013-07-13 NOTE — Progress Notes (Signed)
Notified of pt's exam, will recheck and cb with results.

## 2013-07-13 NOTE — Anesthesia Preprocedure Evaluation (Addendum)
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  Cleared for epidural by NS.   negative psych ROS   GI/Hepatic negative GI ROS, Neg liver ROS,   Endo/Other  negative endocrine ROS  Renal/GU Renal disease (kidney stones, frequent UTIs) Bladder dysfunction (Has urinary retention- self caths - has interstim by urology.)      Musculoskeletal   Abdominal   Peds  Hematology negative hematology ROS (+)   Anesthesia Other Findings   Reproductive/Obstetrics (+) Pregnancy                          Anesthesia Physical Anesthesia Plan  ASA: II  Anesthesia Plan: Epidural   Post-op Pain Management:    Induction:   Airway Management Planned:   Additional Equipment:   Intra-op Plan:   Post-operative Plan:   Informed Consent: I have reviewed the patients History and Physical, chart, labs and discussed the procedure including the risks, benefits and alternatives for the proposed anesthesia with the patient or authorized representative who has indicated his/her understanding and acceptance.     Plan Discussed with: Surgeon  Anesthesia Plan Comments:         Anesthesia Quick Evaluation

## 2013-07-14 ENCOUNTER — Encounter (HOSPITAL_COMMUNITY): Payer: Self-pay | Admitting: *Deleted

## 2013-07-14 LAB — RPR

## 2013-07-14 MED ORDER — SENNOSIDES-DOCUSATE SODIUM 8.6-50 MG PO TABS
2.0000 | ORAL_TABLET | ORAL | Status: DC
  Administered 2013-07-14 – 2013-07-15 (×2): 2 via ORAL
  Filled 2013-07-14 (×2): qty 2

## 2013-07-14 MED ORDER — BENZOCAINE-MENTHOL 20-0.5 % EX AERO: 1.0000 "application " | INHALATION_SPRAY | CUTANEOUS | Status: DC | PRN

## 2013-07-14 MED ORDER — LACTATED RINGERS IV SOLN: INTRAVENOUS | Status: DC

## 2013-07-14 MED ORDER — PRENATAL MULTIVITAMIN CH
1.0000 | ORAL_TABLET | Freq: Every day | ORAL | Status: DC
  Administered 2013-07-14 – 2013-07-15 (×2): 1 via ORAL
  Filled 2013-07-14 (×3): qty 1

## 2013-07-14 MED ORDER — FAMOTIDINE 20 MG PO TABS: 40.0000 mg | ORAL_TABLET | Freq: Once | ORAL | Status: DC

## 2013-07-14 MED ORDER — SIMETHICONE 80 MG PO CHEW: 80.0000 mg | CHEWABLE_TABLET | ORAL | Status: DC | PRN

## 2013-07-14 MED ORDER — ONDANSETRON HCL 4 MG PO TABS: 4.0000 mg | ORAL_TABLET | ORAL | Status: DC | PRN

## 2013-07-14 MED ORDER — LANOLIN HYDROUS EX OINT: TOPICAL_OINTMENT | CUTANEOUS | Status: DC | PRN

## 2013-07-14 MED ORDER — OXYCODONE-ACETAMINOPHEN 5-325 MG PO TABS
1.0000 | ORAL_TABLET | ORAL | Status: DC | PRN
  Administered 2013-07-14 – 2013-07-15 (×2): 1 via ORAL
  Filled 2013-07-14 (×2): qty 1

## 2013-07-14 MED ORDER — METOCLOPRAMIDE HCL 10 MG PO TABS: 10.0000 mg | ORAL_TABLET | Freq: Once | ORAL | Status: DC

## 2013-07-14 MED ORDER — WITCH HAZEL-GLYCERIN EX PADS: 1.0000 "application " | MEDICATED_PAD | CUTANEOUS | Status: DC | PRN

## 2013-07-14 MED ORDER — ONDANSETRON HCL 4 MG/2ML IJ SOLN: 4.0000 mg | INTRAMUSCULAR | Status: DC | PRN

## 2013-07-14 MED ORDER — DIBUCAINE 1 % RE OINT: 1.0000 "application " | TOPICAL_OINTMENT | RECTAL | Status: DC | PRN

## 2013-07-14 MED ORDER — ZOLPIDEM TARTRATE 5 MG PO TABS: 5.0000 mg | ORAL_TABLET | Freq: Every evening | ORAL | Status: DC | PRN

## 2013-07-14 MED ORDER — TETANUS-DIPHTH-ACELL PERTUSSIS 5-2.5-18.5 LF-MCG/0.5 IM SUSP: 0.5000 mL | Freq: Once | INTRAMUSCULAR | Status: DC

## 2013-07-14 MED ORDER — DIPHENHYDRAMINE HCL 25 MG PO CAPS: 25.0000 mg | ORAL_CAPSULE | Freq: Four times a day (QID) | ORAL | Status: DC | PRN

## 2013-07-14 MED ORDER — IBUPROFEN 600 MG PO TABS
600.0000 mg | ORAL_TABLET | Freq: Four times a day (QID) | ORAL | Status: DC
  Administered 2013-07-14 – 2013-07-15 (×6): 600 mg via ORAL
  Filled 2013-07-14 (×7): qty 1

## 2013-07-14 NOTE — Lactation Note (Addendum)
This note was copied from the chart of Hayley Calhoun. Lactation Consultation Note   Initial consult with this mom and baby, now 18 hours post partum. Mom has decided to pump and bottle feed, both eBM and formula. She did try breast feeding 4 times with this baby, but reports 'It does not feel right".she did not explain why, and I did not probe.I showed mom how to set the premie setting, and how to hand express. I explained that pumping will often not express thick colostrum, but hand expression will. I also gave mom a foley cup to express into, and advised her to either finger feed colostrum or if amount is large, to have her nurse or lactation show her how to cup feed her baby. Lactation services reviewed with mom, and she knows to call for questions/concerns. Mom will be getting a DEP from her sister in law. She has Medicaid and plans to apply for [facility], so I explained that she could also get a DEP from [facility], if she is pumping and bottle feeding.  Patient Name: Hayley Calhoun EHUDJ'S Date: 07/14/2013 Reason for consult: Initial assessment   Maternal Data Formula Feeding for Exclusion: Yes Reason for exclusion: Mother's choice to formula and breast feed on admission Infant to breast within first hour of birth: Yes Has patient been taught Hand Expression?: Yes Does the patient have breastfeeding experience prior to this delivery?: Yes  Feeding    LATCH Score/Interventions                      Lactation Tools Discussed/Used Tools: Pump Breast pump type: Double-Electric Breast Pump Pump Review: Setup, frequency, and cleaning (hand expression and breast care reviewed with mom) Initiated by:: bedsie Rn   Consult Status Consult Status: Follow-up Date: 07/15/13 Follow-up type: In-patient    Tonna Corner 07/14/2013, 6:20 PM

## 2013-07-14 NOTE — Progress Notes (Signed)
UR chart review completed.  

## 2013-07-14 NOTE — Progress Notes (Deleted)
Post Partum Day 1 Subjective: no complaints, up ad lib, voiding, tolerating PO and + flatus  Objective: Blood pressure 125/70, pulse 89, temperature 98.3 F (36.8 C), temperature source Oral, resp. rate 18, height 5\' 4"  (1.626 m), weight 72.576 kg (160 lb), last menstrual period 10/15/2012, SpO2 100.00%, unknown if currently breastfeeding.  Physical Exam:  General: alert, cooperative, appears stated age and no distress Lochia: appropriate Uterine Fundus: firm DVT Evaluation: No evidence of DVT seen on physical exam. Negative Homan's sign. No cords or calf tenderness. No significant calf/ankle edema.   Recent Labs  07/13/13 1815  HGB 11.9*  HCT 34.0*    Assessment/Plan: Plan for discharge tomorrow, Breastfeeding and Contraception BTL Pt does not have consent and will have to be done at 30ds   LOS: 1 day   Allen Norris 07/14/2013, 8:20 AM

## 2013-07-14 NOTE — Plan of Care (Signed)
Problem: Phase I Progression Outcomes Goal: Voiding adequately Outcome: Completed/Met Date Met:  07/14/13 Patient performs self catheterizations due to history of spina bifida and urinary retention

## 2013-07-14 NOTE — Progress Notes (Signed)
Post Partum Day 1 Subjective: no complaints, voiding and + flatus  Objective: Blood pressure 125/70, pulse 89, temperature 98.3 F (36.8 C), temperature source Oral, resp. rate 18, height 5\' 4"  (1.626 m), weight 72.576 kg (160 lb), last menstrual period 10/15/2012, SpO2 100.00%, unknown if currently breastfeeding.  Physical Exam:  General: alert, cooperative and no distress Lochia: appropriate Uterine Fundus: firm Incision: n/a DVT Evaluation: No evidence of DVT seen on physical exam.   Recent Labs  07/13/13 1815  HGB 11.9*  HCT 34.0*    Assessment/Plan: Plan for discharge tomorrow, Breastfeeding and Contraception : BTL, pending the consent papers being found at the office.    LOS: 1 day   Rosemarie Ax 07/14/2013, 7:43 AM   I spoke with and examined patient and agree with resident's note and plan of care.  Fredrik Rigger, MD OB Fellow 07/14/2013 8:24 AM

## 2013-07-15 MED ORDER — IBUPROFEN 600 MG PO TABS: 600.0000 mg | ORAL_TABLET | Freq: Four times a day (QID) | ORAL | Status: DC

## 2013-07-15 NOTE — Discharge Summary (Signed)
Obstetric Discharge Summary Reason for Admission:  Onset of labor; patient complaining of painful contractions 5 mins apart Prenatal Procedures: oxygen therapy Intrapartum Procedures: spontaneous vaginal delivery Postpartum Procedures: none Complications-Operative and Postpartum: none Hemoglobin  Date Value Ref Range Status  07/13/2013 11.9* 12.0 - 15.0 g/dL Final     HCT  Date Value Ref Range Status  07/13/2013 34.0* 36.0 - 46.0 % Final    Physical Exam:  General: alert, cooperative and no distress Lochia: appropriate Uterine Fundus: firm Incision: N/A DVT Evaluation: No evidence of DVT seen on physical exam. Negative Homan's sign. No significant calf/ankle edema.  Discharge Diagnoses: Term Pregnancy-delivered  Discharge Information: Date: 07/15/2013 Activity: pelvic rest Diet: increase as tolerated Medications: Ibuprofen Condition: stable Instructions: refer to practice specific booklet Discharge to: home Follow-up Information   Follow up with [facility] In 2 weeks. (for tubal appointment)    Specialty:  Obstetrics and Gynecology   Contact information:   [place] [address] [phone]      Follow up with [facility] In 6 weeks. (for postpartum visit)    Specialty:  Obstetrics and Gynecology   Contact information:   [place] [address] [phone]      Follow up with [facility]. (As needed in emergencies )    Contact information:   [address] [id number] [place] [address] [phone]      Newborn Data: Live born female  Birth Weight: 7 lb 3.5 oz (3275 g) APGAR: 8, 9  Home with mother and father  Hayley Calhoun 07/15/2013, 9:05 AM  I have seen and examined this patient and agree with above documentation in the PA student's note.   Ebbie Latus, M.D. [facility] Fellow 07/15/2013 9:40 AM

## 2013-07-15 NOTE — Lactation Note (Signed)
This note was copied from the chart of Hayley Lacheryl Niesen. Lactation Consultation Note  Patient Name: Hayley Calhoun [id number] Date: 07/15/2013 Reason for consult: Follow-up assessment (momplans to pumping and bottlefeed ) Per mom has only pumped x2 in the last 24 hours and has hand expressed . Plans to obtain a breast pump from her sister in law , [place] reviewed supply and demand  And the importance of consistent pumping around the clock , every 2 1/2 - 3 hours both breast. Also sore nipple and engorgement prevention and tx . Per mom had antibiotics in labor due to hx of chronic  UTI's . LC reviewed potential challenges with thrush for the baby and yeast infections . LC encouraged mom to call back with questions or pumping concerns. Mom aware of the [facility] O/P services and BFS[facility].   Maternal Data    Feeding Feeding Type:  (per mom baby has fed in the last hour from a bottle )  LATCH Score/Interventions                      Lactation Tools Discussed/Used WIC Program:  (per mom plans to call [facility] )   Consult Status Consult Status: Complete    Hayley Calhoun 07/15/2013, 11:00 AM

## 2013-07-15 NOTE — Progress Notes (Signed)
Post Partum Day 2 Subjective: no complaints, up ad lib, voiding, tolerating PO and + flatus  Objective: Blood pressure 110/74, pulse 83, temperature 97.6 F (36.4 C), temperature source Oral, resp. rate 19, height 5\' 4"  (1.626 m), weight 72.576 kg (160 lb), last menstrual period 10/15/2012, SpO2 99.00%, unknown if currently breastfeeding.  Physical Exam:  General: alert, cooperative and no distress Lochia: appropriate Uterine Fundus: firm Incision: N/A DVT Evaluation: No evidence of DVT seen on physical exam. Negative Homan's sign. No significant calf/ankle edema.   Recent Labs  07/13/13 1815  HGB 11.9*  HCT 34.0*    Assessment/Plan: Discharge home; breastfeeding; Contraceptive BTL   LOS: 2 days   Vyom Brass L Elania Crowl 07/15/2013, 8:08 AM

## 2013-07-15 NOTE — Discharge Instructions (Signed)
Vaginal Delivery Care After Refer to this sheet in the next few weeks. These discharge instructions provide you with information on caring for yourself after delivery. Your caregiver may also give you specific instructions. Your treatment has been planned according to the most current medical practices available, but problems sometimes occur. Call your caregiver if you have any problems or questions after you go home. HOME CARE INSTRUCTIONS  Take over-the-counter or prescription medicines only as directed by your caregiver or pharmacist.  Do not drink alcohol, especially if you are breastfeeding or taking medicine to relieve pain.  Do not chew or smoke tobacco.  Do not use illegal drugs.  Continue to use good perineal care. Good perineal care includes:  Wiping your perineum from front to back.  Keeping your perineum clean.  Do not use tampons or douche until your caregiver says it is okay.  Shower, wash your hair, and take tub baths as directed by your caregiver.  Wear a well-fitting bra that provides breast support.  Eat healthy foods.  Drink enough fluids to keep your urine clear or pale yellow.  Eat high-fiber foods such as whole grain cereals and breads, brown rice, beans, and fresh fruits and vegetables every day. These foods may help prevent or relieve constipation.  Follow your cargiver's recommendations regarding resumption of activities such as climbing stairs, driving, lifting, exercising, or traveling.  Talk to your caregiver about resuming sexual activities. Resumption of sexual activities is dependent upon your risk of infection, your rate of healing, and your comfort and desire to resume sexual activity.  Try to have someone help you with your household activities and your newborn for at least a few days after you leave the hospital.  Rest as much as possible. Try to rest or take a nap when your newborn is sleeping.  Increase your activities gradually.  Keep all  of your scheduled postpartum appointments. It is very important to keep your scheduled follow-up appointments. At these appointments, your caregiver will be checking to make sure that you are healing physically and emotionally. SEEK MEDICAL CARE IF:   You are passing large clots from your vagina. Save any clots to show your caregiver.  You have a foul smelling discharge from your vagina.  You have trouble urinating.  You are urinating frequently.  You have pain when you urinate.  You have a change in your bowel movements.  You have increasing redness, pain, or swelling near your vaginal incision (episiotomy) or vaginal tear.  You have pus draining from your episiotomy or vaginal tear.  Your episiotomy or vaginal tear is separating.  You have painful, hard, or reddened breasts.  You have a severe headache.  You have blurred vision or see spots.  You feel sad or depressed.  You have thoughts of hurting yourself or your newborn.  You have questions about your care, the care of your newborn, or medicines.  You are dizzy or lightheaded.  You have a rash.  You have nausea or vomiting.  You were breastfeeding and have not had a menstrual period within 12 weeks after you stopped breastfeeding.  You are not breastfeeding and have not had a menstrual period by the 12th week after delivery.  You have a fever. SEEK IMMEDIATE MEDICAL CARE IF:   You have persistent pain.  You have chest pain.  You have shortness of breath.  You faint.  You have leg pain.  You have stomach pain.  Your vaginal bleeding saturates two or more sanitary pads  in 1 hour. MAKE SURE YOU:   Understand these instructions.  Will watch your condition.  Will get help right away if you are not doing well or get worse. Document Released: 01/27/2000 Document Revised: 10/24/2011 Document Reviewed: 09/26/2011 [facility] Patient Information 2014 [place]. Vaginal delivery aftercare; breast  feeding; Tubal Ligation infromation

## 2013-07-16 ENCOUNTER — Ambulatory Visit (HOSPITAL_COMMUNITY): Admission: RE | Admit: 2013-07-16 | Payer: Medicaid Other | Source: Ambulatory Visit

## 2013-07-16 ENCOUNTER — Other Ambulatory Visit: Payer: Medicaid Other

## 2013-07-16 ENCOUNTER — Encounter: Payer: Self-pay | Admitting: *Deleted

## 2013-07-16 NOTE — Anesthesia Postprocedure Evaluation (Signed)
Patient stable following vaginal delivery.

## 2013-07-17 NOTE — Progress Notes (Signed)
See d/c summary from that day.

## 2013-07-22 ENCOUNTER — Telehealth: Payer: Self-pay | Admitting: General Practice

## 2013-07-22 NOTE — Telephone Encounter (Signed)
Patient called and left message stating she gave birth on June 1st and has a question about the tubal and would like a call back. Called patient and she stated that she was wondering what the recovery time was for getting her tubes tied. Told patient I could not tell her specific days of being out of work but that the procedure is done very minimally and recovery is a pretty quick turnaround but that when she comes in for her pp visit on 7/15 they will discuss the tubal at length then and answer any questions she may have at that time as well. Patient verbalized understanding and had no further questions

## 2013-07-29 ENCOUNTER — Encounter: Payer: Self-pay | Admitting: *Deleted

## 2013-08-05 ENCOUNTER — Telehealth: Payer: Self-pay

## 2013-08-05 NOTE — Telephone Encounter (Signed)
Attempted to call patient. No answer. Left message stating we are returning your call, please call clinic.

## 2013-08-05 NOTE — Telephone Encounter (Signed)
Patient called stating she has a question for a nurse and would like a call back.

## 2013-08-05 NOTE — Telephone Encounter (Signed)
Patient returned call. Called patient back. Patient stated she was calling to see if it was OK to resume sexual activity (delivered baby 07/13/13)-- states " I read it would be OK after I stopped bleeding and I have stopped bleeding." Informed patient that we recommend she waits until PP visit, however, if she does resume sexual activity we recommend she use condoms until [facility] appointment 08/26/13 during which time we will be able insert Nexplanon as she wishes. Explained the importance of avoiding pregnancy so soon after delivery and explained risks of short interval between pregnancies. Informed patient we will not be able to insert nexplanon at that visit if she has unprotected sex prior to appointment. Patient verbalized understanding. No further questions or concerns.

## 2013-08-26 ENCOUNTER — Encounter: Payer: Self-pay | Admitting: Obstetrics & Gynecology

## 2013-08-26 ENCOUNTER — Other Ambulatory Visit (HOSPITAL_COMMUNITY)
Admission: RE | Admit: 2013-08-26 | Discharge: 2013-08-26 | Disposition: A | Discharge status: Home / Self Care | Payer: Medicaid Other | Source: Ambulatory Visit | Attending: Obstetrics & Gynecology | Admitting: Obstetrics & Gynecology

## 2013-08-26 ENCOUNTER — Ambulatory Visit (INDEPENDENT_AMBULATORY_CARE_PROVIDER_SITE_OTHER): Payer: Medicaid Other | Admitting: Obstetrics & Gynecology

## 2013-08-26 DIAGNOSIS — Z01419 Encounter for gynecological examination (general) (routine) without abnormal findings: Secondary | ICD-10-CM | POA: Insufficient documentation

## 2013-08-26 DIAGNOSIS — Z01812 Encounter for preprocedural laboratory examination: Secondary | ICD-10-CM

## 2013-08-26 DIAGNOSIS — Z30013 Encounter for initial prescription of injectable contraceptive: Secondary | ICD-10-CM

## 2013-08-26 DIAGNOSIS — Z3009 Encounter for other general counseling and advice on contraception: Secondary | ICD-10-CM

## 2013-08-26 LAB — POCT PREGNANCY, URINE: PREG TEST UR: NEGATIVE

## 2013-08-26 MED ORDER — ETONOGESTREL 68 MG ~~LOC~~ IMPL
68.0000 mg | DRUG_IMPLANT | Freq: Once | SUBCUTANEOUS | Status: AC
  Administered 2013-08-26: 68 mg via SUBCUTANEOUS

## 2013-08-26 NOTE — Progress Notes (Signed)
    Subjective:     Hayley Calhoun is a 28 y.o. [phone] female who presents for a postpartum visit. She is 6 weeks postpartum following a spontaneous vaginal delivery. I have fully reviewed the prenatal and intrapartum course. The delivery was at 81 gestational weeks.  Anesthesia: epidural. Postpartum course has been uncomplicated. Baby's course has been uncomplicated. Baby is feeding by bottle. Bleeding: no bleeding. Bowel function is normal. Bladder function is the same, has urinary retention due to spina bifilda and self-catheterizes. Patient is sexually active. Contraception method is condoms, desires Nexplanon today. Postpartum depression screening: negative.  The following portions of the patient's history were reviewed and updated as appropriate: allergies, current medications, past family history, past medical history, past social history, past surgical history and problem list.  Last pap smear was in 2011, not done during pregnancy.   Review of Systems Pertinent items are noted in HPI.   Objective:    BP 132/84  Pulse 79  Temp(Src) 98.4 F (36.9 C) (Oral)  Resp 20  Ht 5\' 4"  (1.626 m)  Wt 143 lb 12.8 oz (65.227 kg)  BMI 24.67 kg/m2  LMP 08/17/2013  Breastfeeding? No  General:  cooperative and no distress   Breasts:  inspection negative, no nipple discharge or bleeding, no masses or nodularity palpable  Lungs: clear to auscultation bilaterally  Heart:  regular rate and rhythm  Abdomen: soft, non-tender; bowel sounds normal; no masses,  no organomegaly   Vulva:  normal  Vagina: normal vagina  Cervix:  multiparous appearance and no bleeding following Pap  Corpus: normal size, contour, position, consistency, mobility, non-tender  Adnexa:  normal adnexa and no mass, fullness, tenderness  Rectal Exam: Not performed.    Nexplanon Insertion Procedure Patient was given informed consent, she signed consent form.  Patient does understand that irregular bleeding is a very common side  effect of this medication. She was advised to have backup contraception for one week after placement. Pregnancy test in clinic today was negative.  Appropriate time out taken.  Patient's left arm was prepped and draped in the usual sterile fashion.. The ruler used to measure and mark insertion area.  Patient was prepped with alcohol swab and then injected with 3 ml of 1% lidocaine.  She was prepped with betadine, Nexplanon removed from packaging,  Device confirmed in needle, then inserted full length of needle and withdrawn per handbook instructions. Nexplanon was able to palpated in the patient's arm; patient palpated the insert herself. There was minimal blood loss.  Patient insertion site covered with guaze and a pressure bandage to reduce any bruising.  The patient tolerated the procedure well and was given post procedure instructions.     Assessment:   Normal postpartum exam. Pap smear done at today's visit. Nexplanon inserted.  Plan:   1. Contraception: Nexplanon placed today 2. Follow up  pap smear 3. Follow up as needed    Verita Schneiders, MD, [place] Attending [place] for [facility], [place]

## 2013-08-26 NOTE — Patient Instructions (Signed)
Return to clinic for any scheduled appointments or for any gynecologic concerns as needed.   

## 2013-08-28 LAB — CYTOLOGY - PAP

## 2013-09-04 ENCOUNTER — Encounter: Payer: Self-pay | Admitting: General Practice

## 2013-10-02 ENCOUNTER — Encounter: Payer: Self-pay | Admitting: General Practice

## 2013-12-14 ENCOUNTER — Encounter: Payer: Self-pay | Admitting: Obstetrics & Gynecology

## 2014-01-13 ENCOUNTER — Encounter: Payer: Self-pay | Admitting: Family Medicine

## 2014-01-14 ENCOUNTER — Encounter: Payer: Self-pay | Admitting: Obstetrics & Gynecology

## 2015-02-21 ENCOUNTER — Telehealth: Payer: Self-pay

## 2015-02-21 NOTE — Telephone Encounter (Signed)
Pt called stating she would like her nexplanon removed due to bleeding and clotting.The first available appt will be 03/16/2015@12 :45 pt notified of results.

## 2015-02-26 ENCOUNTER — Encounter (HOSPITAL_COMMUNITY): Payer: Self-pay

## 2015-02-26 ENCOUNTER — Inpatient Hospital Stay (HOSPITAL_COMMUNITY)
Admission: AD | Admit: 2015-02-26 | Discharge: 2015-02-26 | Disposition: A | Discharge status: Home / Self Care | Payer: Self-pay | Source: Ambulatory Visit | Attending: Obstetrics & Gynecology | Admitting: Obstetrics & Gynecology

## 2015-02-26 DIAGNOSIS — Z880 Allergy status to penicillin: Secondary | ICD-10-CM | POA: Insufficient documentation

## 2015-02-26 DIAGNOSIS — Z3202 Encounter for pregnancy test, result negative: Secondary | ICD-10-CM | POA: Insufficient documentation

## 2015-02-26 DIAGNOSIS — N390 Urinary tract infection, site not specified: Secondary | ICD-10-CM | POA: Insufficient documentation

## 2015-02-26 DIAGNOSIS — D259 Leiomyoma of uterus, unspecified: Secondary | ICD-10-CM | POA: Insufficient documentation

## 2015-02-26 DIAGNOSIS — N939 Abnormal uterine and vaginal bleeding, unspecified: Secondary | ICD-10-CM | POA: Insufficient documentation

## 2015-02-26 LAB — URINALYSIS, ROUTINE W REFLEX MICROSCOPIC
BILIRUBIN URINE: NEGATIVE
GLUCOSE, UA: NEGATIVE mg/dL
Ketones, ur: NEGATIVE mg/dL
NITRITE: POSITIVE — AB
PH: 6 (ref 5.0–8.0)
Protein, ur: NEGATIVE mg/dL
Specific Gravity, Urine: 1.025 (ref 1.005–1.030)

## 2015-02-26 LAB — CBC
HCT: 35.6 % — ABNORMAL LOW (ref 36.0–46.0)
HEMOGLOBIN: 12.4 g/dL (ref 12.0–15.0)
MCH: 25.6 pg — AB (ref 26.0–34.0)
MCHC: 34.8 g/dL (ref 30.0–36.0)
MCV: 73.4 fL — ABNORMAL LOW (ref 78.0–100.0)
PLATELETS: 273 10*3/uL (ref 150–400)
RBC: 4.85 MIL/uL (ref 3.87–5.11)
RDW: 14.8 % (ref 11.5–15.5)
WBC: 3.4 10*3/uL — ABNORMAL LOW (ref 4.0–10.5)

## 2015-02-26 LAB — WET PREP, GENITAL
CLUE CELLS WET PREP: NONE SEEN
SPERM: NONE SEEN
Trich, Wet Prep: NONE SEEN
Yeast Wet Prep HPF POC: NONE SEEN

## 2015-02-26 LAB — URINE MICROSCOPIC-ADD ON

## 2015-02-26 LAB — POCT PREGNANCY, URINE: Preg Test, Ur: NEGATIVE

## 2015-02-26 MED ORDER — CIPROFLOXACIN HCL 500 MG PO TABS: 500.0000 mg | ORAL_TABLET | Freq: Two times a day (BID) | ORAL | Status: DC

## 2015-02-26 MED ORDER — NORGESTIMATE-ETH ESTRADIOL 0.25-35 MG-MCG PO TABS: 1.0000 | ORAL_TABLET | Freq: Every day | ORAL | Status: DC

## 2015-02-26 NOTE — Discharge Instructions (Signed)
Abnormal Uterine Bleeding Abnormal uterine bleeding can affect women at various stages in life, including teenagers, women in their reproductive years, pregnant women, and women who have reached menopause. Several kinds of uterine bleeding are considered abnormal, including:  Bleeding or spotting between periods.   Bleeding after sexual intercourse.   Bleeding that is heavier or more than normal.   Periods that last longer than usual.  Bleeding after menopause.  Many cases of abnormal uterine bleeding are minor and simple to treat, while others are more serious. Any type of abnormal bleeding should be evaluated by your health care provider. Treatment will depend on the cause of the bleeding. HOME CARE INSTRUCTIONS Monitor your condition for any changes. The following actions may help to alleviate any discomfort you are experiencing:  Avoid the use of tampons and douches as directed by your health care provider.  Change your pads frequently. You should get regular pelvic exams and Pap tests. Keep all follow-up appointments for diagnostic tests as directed by your health care provider.  SEEK MEDICAL CARE IF:   Your bleeding lasts more than 1 week.   You feel dizzy at times.  SEEK IMMEDIATE MEDICAL CARE IF:   You pass out.   You are changing pads every 15 to 30 minutes.   You have abdominal pain.  You have a fever.   You become sweaty or weak.   You are passing large blood clots from the vagina.   You start to feel nauseous and vomit. MAKE SURE YOU:   Understand these instructions.  Will watch your condition.  Will get help right away if you are not doing well or get worse.   This information is not intended to replace advice given to you by your health care provider. Make sure you discuss any questions you have with your health care provider.   Document Released: 01/29/2005 Document Revised: 02/03/2013 Document Reviewed: 08/28/2012 Elsevier Interactive  Patient Education 2016 [employer]. Asymptomatic Bacteriuria, Female Asymptomatic bacteriuria is the presence of a large number of bacteria in your urine without the usual symptoms of burning or frequent urination. The following conditions increase the risk of asymptomatic bacteriuria:  Diabetes mellitus.  Advanced age.  Pregnancy in the first trimester.  Kidney stones.  Kidney transplants.  Leaky kidney tube valve in young children (reflux). Treatment for this condition is not needed in most people and can lead to other problems such as too much yeast and growth of resistant bacteria. However, some people, such as pregnant women, do need treatment to prevent kidney infection. Asymptomatic bacteriuria in pregnancy is also associated with fetal growth restriction, premature labor, and newborn death. HOME CARE INSTRUCTIONS Monitor your condition for any changes. The following actions may help to relieve any discomfort you are feeling:  Drink enough water and fluids to keep your urine clear or pale yellow. Go to the bathroom more often to keep your bladder empty.  Keep the area around your vagina and rectum clean. Wipe yourself from front to back after urinating. SEEK IMMEDIATE MEDICAL CARE IF:  You develop signs of an infection such as:  Burning with urination.  Frequency of voiding.  Back pain.  Fever.  You have blood in the urine.  You develop a fever. MAKE SURE YOU:  Understand these instructions.  Will watch your condition.  Will get help right away if you are not doing well or get worse.   This information is not intended to replace advice given to you by your health care  provider. Make sure you discuss any questions you have with your health care provider.   Document Released: 01/29/2005 Document Revised: 02/19/2014 Document Reviewed: 07/21/2012 Elsevier Interactive Patient Education [employer].

## 2015-02-26 NOTE — MAU Provider Note (Signed)
History     CSN: [id number]  Arrival date and time: 02/26/15 [id number]   First Provider Initiated Contact with Patient 02/26/15 1017      Chief Complaint  Patient presents with  . Vaginal Bleeding  . Fatigue  . Abdominal Pain  . Headache   HPI   Hayley Calhoun is a 30 y.o. female [phone] with a history of spina bifida and a bladder disorder presents to MAU with vaginal bleeding X 3 weeks. She is bleeding every day for 3 weeks. The bleeding waxes and wanes from heavy to very light. She does have a history of fibroids and has not had these evaluated in a while.   She is having mild-mid-lower abdominal pain that comes and goes. This has been going on for a while.   The patient self-caths her self at home and has a urologist outside of [facility].   She has a nexplanon in now, however plans to have it removed to see if it resolves the symptoms. She has had it for 2 years now.   She has an appointment scheduled in the [facility] on February 1 to discuss having her nexplanon removal due to abnormal vaginal bleeding.   OB History    Gravida Para Term Preterm AB TAB SAB Ectopic Multiple Living   9 3 3  0 6 0 6 0 0 3      Past Medical History  Diagnosis Date  . Spina bifida   . Bladder disorder   . Kidney stone   . UTI (lower urinary tract infection)   . Kidney stones   . Pregnant     Past Surgical History  Procedure Laterality Date  . Spina bifida    . Lithotripsy    . Bladder botox    . Interstim implant placement    . Wisdom tooth extraction      Family History  Problem Relation Age of Onset  . Other Neg Hx   . Scoliosis Mother     Social History  Substance Use Topics  . Smoking status: Never Smoker   . Smokeless tobacco: Never Used  . Alcohol Use: No    Allergies:  Allergies  Allergen Reactions  . Sulfa Antibiotics Shortness Of Breath, Itching and Other (See Comments)    Itching and burning in chest; and could not breathe.  . Keflex [Cephalexin]  Nausea And Vomiting  . Nitrofurantoin Monohyd Macro Nausea And Vomiting  . Penicillins Other (See Comments)    Has patient had a PCN reaction causing immediate rash, facial/tongue/throat swelling, SOB or lightheadedness with hypotension: No Has patient had a PCN reaction causing severe rash involving mucus membranes or skin necrosis: No Has patient had a PCN reaction that required hospitalization No Has patient had a PCN reaction occurring within the last 10 years: No If all of the above answers are "NO", then may proceed with Cephalosporin use. Childhood reaction unknown    Prescriptions prior to admission  Medication Sig Dispense Refill Last Dose  . Multiple Vitamins-Calcium (ONE-A-DAY WOMENS PO) Take 1 tablet by mouth daily.   02/26/2015 at Unknown time  . Fe Fum-FePoly-FA-Vit C-Vit B3 (INTEGRA F) 125-1 MG CAPS Take 1 tablet by mouth daily. (Patient not taking: Reported on 02/26/2015) 30 capsule 1 Not Taking  . ibuprofen (ADVIL,MOTRIN) 600 MG tablet Take 1 tablet (600 mg total) by mouth every 6 (six) hours. 30 tablet 0 prn   Results for orders placed or performed during the hospital encounter of 02/26/15 (from the  past 48 hour(s))  Urinalysis, Routine w reflex microscopic (not at [facility])     Status: Abnormal   Collection Time: 02/26/15  9:40 AM  Result Value Ref Range   Color, Urine YELLOW YELLOW   APPearance HAZY (A) CLEAR   Specific Gravity, Urine 1.025 1.005 - 1.030   pH 6.0 5.0 - 8.0   Glucose, UA NEGATIVE NEGATIVE mg/dL   Hgb urine dipstick LARGE (A) NEGATIVE   Bilirubin Urine NEGATIVE NEGATIVE   Ketones, ur NEGATIVE NEGATIVE mg/dL   Protein, ur NEGATIVE NEGATIVE mg/dL   Nitrite POSITIVE (A) NEGATIVE   Leukocytes, UA SMALL (A) NEGATIVE  Urine microscopic-add on     Status: Abnormal   Collection Time: 02/26/15  9:40 AM  Result Value Ref Range   Squamous Epithelial / LPF 0-5 (A) NONE SEEN   WBC, UA 6-30 0 - 5 WBC/hpf   RBC / HPF 6-30 0 - 5 RBC/hpf   Bacteria, UA MANY (A) NONE  SEEN  CBC     Status: Abnormal   Collection Time: 02/26/15  9:42 AM  Result Value Ref Range   WBC 3.4 (L) 4.0 - 10.5 K/uL   RBC 4.85 3.87 - 5.11 MIL/uL   Hemoglobin 12.4 12.0 - 15.0 g/dL   HCT 35.6 (L) 36.0 - 46.0 %   MCV 73.4 (L) 78.0 - 100.0 fL   MCH 25.6 (L) 26.0 - 34.0 pg   MCHC 34.8 30.0 - 36.0 g/dL   RDW 14.8 11.5 - 15.5 %   Platelets 273 150 - 400 K/uL  Pregnancy, urine POC     Status: None   Collection Time: 02/26/15  9:48 AM  Result Value Ref Range   Preg Test, Ur NEGATIVE NEGATIVE    Comment:        THE SENSITIVITY OF THIS METHODOLOGY IS >24 mIU/mL   Wet prep, genital     Status: Abnormal   Collection Time: 02/26/15 10:55 AM  Result Value Ref Range   Yeast Wet Prep HPF POC NONE SEEN NONE SEEN   Trich, Wet Prep NONE SEEN NONE SEEN   Clue Cells Wet Prep HPF POC NONE SEEN NONE SEEN   WBC, Wet Prep HPF POC FEW (A) NONE SEEN    Comment: FEW BACTERIA SEEN   Sperm NONE SEEN     Review of Systems  Constitutional: Positive for malaise/fatigue.  Gastrointestinal: Positive for abdominal pain (Bilateral lower abdominal pain. ).  Genitourinary: Positive for urgency. Negative for dysuria.  Neurological: Positive for dizziness and weakness.   Physical Exam   Blood pressure 138/91, pulse 97, temperature 98 F (36.7 C), temperature source Oral, resp. rate 18, last menstrual period 02/06/2015, not currently breastfeeding.  Physical Exam  Constitutional: She is oriented to person, place, and time. She appears well-developed and well-nourished. No distress.  HENT:  Head: Normocephalic.  Eyes: Pupils are equal, round, and reactive to light.  Neck: Neck supple.  Respiratory: Effort normal.  GI: Soft. She exhibits no distension. There is no tenderness. There is no rebound.  Genitourinary:  Speculum exam: Vagina - Small amount of dark red blood, no odor Cervix - + active bleeding  Bimanual exam: Cervix closed, no CMT  Uterus non tender, normal size Adnexa non tender, no  masses bilaterally GC/Chlam, wet prep done Chaperone present for exam.  Musculoskeletal: Normal range of motion.  Neurological: She is alert and oriented to person, place, and time.  Skin: Skin is warm. She is not diaphoretic.  Psychiatric: Her behavior is normal.  MAU Course  Procedures None  MDM    Assessment and Plan   A:  1. Abnormal vaginal bleeding   2. Uterine leiomyoma, unspecified location   3. Acute UTI (urinary tract infection)     P:  Discharge home in stable condition Outpatient pelvic US ordered to assess fibroids Keep appointment in the [facility] on February 1 RX: sprintec- take 2 pills times 5 days then one pill daily until placebo. 1 pack dispensed        cipro  Follow up with urology due to UTI.  Return to MAU if symptoms worsen  Continue iron po at home.           Lezlie Lye, NP 02/26/2015 1:30 PM

## 2015-02-26 NOTE — MAU Note (Signed)
Patient reports vaginal bleeding x 3 weeks changing pads/tampons every few hours, passing clots, abdominal pain rates 5/10, headaches has been taking Ibuprofen, feels weak.

## 2015-02-28 LAB — GC/CHLAMYDIA PROBE AMP (~~LOC~~) NOT AT ARMC
Chlamydia: NEGATIVE
Neisseria Gonorrhea: NEGATIVE

## 2015-03-04 ENCOUNTER — Ambulatory Visit (HOSPITAL_COMMUNITY)
Admission: RE | Admit: 2015-03-04 | Discharge: 2015-03-04 | Disposition: A | Discharge status: Home / Self Care | Payer: Self-pay | Source: Ambulatory Visit | Attending: Obstetrics and Gynecology | Admitting: Obstetrics and Gynecology

## 2015-03-04 DIAGNOSIS — N83202 Unspecified ovarian cyst, left side: Secondary | ICD-10-CM | POA: Insufficient documentation

## 2015-03-04 DIAGNOSIS — N939 Abnormal uterine and vaginal bleeding, unspecified: Secondary | ICD-10-CM | POA: Insufficient documentation

## 2015-03-04 DIAGNOSIS — D259 Leiomyoma of uterus, unspecified: Secondary | ICD-10-CM | POA: Insufficient documentation

## 2015-03-16 ENCOUNTER — Ambulatory Visit (INDEPENDENT_AMBULATORY_CARE_PROVIDER_SITE_OTHER): Payer: BLUE CROSS/BLUE SHIELD | Admitting: Obstetrics & Gynecology

## 2015-03-16 ENCOUNTER — Encounter: Payer: Self-pay | Admitting: Obstetrics & Gynecology

## 2015-03-16 VITALS — BP 144/92 | HR 94 | Temp 98.3°F | Ht 65.0 in | Wt 154.0 lb

## 2015-03-16 DIAGNOSIS — Z3046 Encounter for surveillance of implantable subdermal contraceptive: Secondary | ICD-10-CM | POA: Diagnosis not present

## 2015-03-16 DIAGNOSIS — Z30018 Encounter for initial prescription of other contraceptives: Secondary | ICD-10-CM

## 2015-03-16 MED ORDER — ETONOGESTREL-ETHINYL ESTRADIOL 0.12-0.015 MG/24HR VA RING: VAGINAL_RING | VAGINAL | Status: DC

## 2015-03-16 NOTE — Progress Notes (Signed)
Cc: Heavy menses Patient has irregular heavy menses with Nexplanon for 18 months and she requests removal. She wants to try Nuvaring   Patient given informed consent for removal of her Nexplanon, time out was performed.  Signed copy in the chart.  Appropriate time out taken. Nexplanon site identified.  Area prepped in usual sterile fashon. One cc of 1% lidocaine was used to anesthetize the area at the distal end of the implant. A small stab incision was made right beside the implant on the distal portion.  The rod was grasped using hemostats and removed without difficulty.  There was less than 3 cc blood loss. There were no complications.  A pressure bandage was applied to reduce any bruising.  The patient tolerated the procedure well and was given post procedure instructions. She requested Nuvaring and information was given.  Woodroe Mode, MD 03/16/2015

## 2015-03-16 NOTE — Patient Instructions (Signed)

## 2015-05-12 ENCOUNTER — Telehealth: Payer: Self-pay | Admitting: General Practice

## 2015-05-12 NOTE — Telephone Encounter (Signed)
Patient called and left message stating she is calling to make sure she is taking her birth control the right way. Patient states the pharmacy told her she could insert her birth control even as she was bleeding but she has started to bleed heavier since doing this. Patient is calling to make sure she is taking it right. Called patient back and she states that the doctor told her she could wear the nuvaring continuously so she wouldn't get a period and to change out her ring every 4 weeks. Patient states she started to spot so she took her nuvaring out on the 26th and put a new one in on the 28th and now she is bleeding heavier. Patient wants to make sure she is okay by doing that. Told patient that is okay but she needs to make sure she is taking one out and putting a new one in on the same day and 4 weeks to the day. Patient verbalized understanding & had no other questions

## 2015-08-11 ENCOUNTER — Ambulatory Visit: Payer: BLUE CROSS/BLUE SHIELD | Admitting: Neurology

## 2015-08-22 ENCOUNTER — Telehealth: Payer: Self-pay

## 2015-08-22 ENCOUNTER — Ambulatory Visit (INDEPENDENT_AMBULATORY_CARE_PROVIDER_SITE_OTHER): Payer: BLUE CROSS/BLUE SHIELD | Admitting: Neurology

## 2015-08-22 DIAGNOSIS — Q057 Lumbar spina bifida without hydrocephalus: Secondary | ICD-10-CM

## 2015-08-22 NOTE — Telephone Encounter (Signed)
Pt no-showed her new patient appt this morning. 

## 2015-08-23 NOTE — Progress Notes (Signed)
This patient did not show for the new patient appointment today.

## 2015-08-23 NOTE — Telephone Encounter (Signed)
08/23/15-pt c/a appt for 08/24/15-pt wants to wait until a more convenient time to schedule appt

## 2015-08-24 ENCOUNTER — Ambulatory Visit: Payer: BLUE CROSS/BLUE SHIELD | Admitting: Neurology

## 2015-08-29 ENCOUNTER — Encounter: Payer: Self-pay | Admitting: Neurology

## 2015-09-01 ENCOUNTER — Ambulatory Visit: Payer: BLUE CROSS/BLUE SHIELD | Admitting: Neurology

## 2015-12-26 IMAGING — US US MFM OB TRANSVAGINAL
1 series · 13 of 21 positions shown · non-contrast
Comparison: none

[Series 1: us mfm ob transvaginal · 0.23mm/px · 13 of 21 slices shown]
[im 1/21]
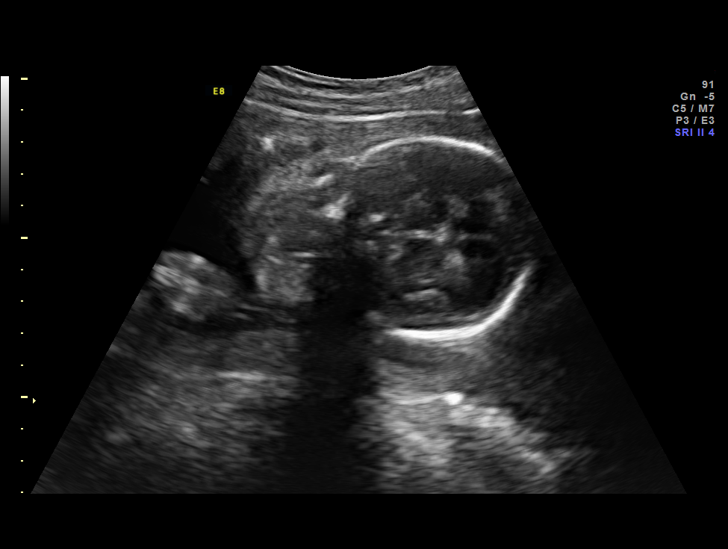
[im 3/21]
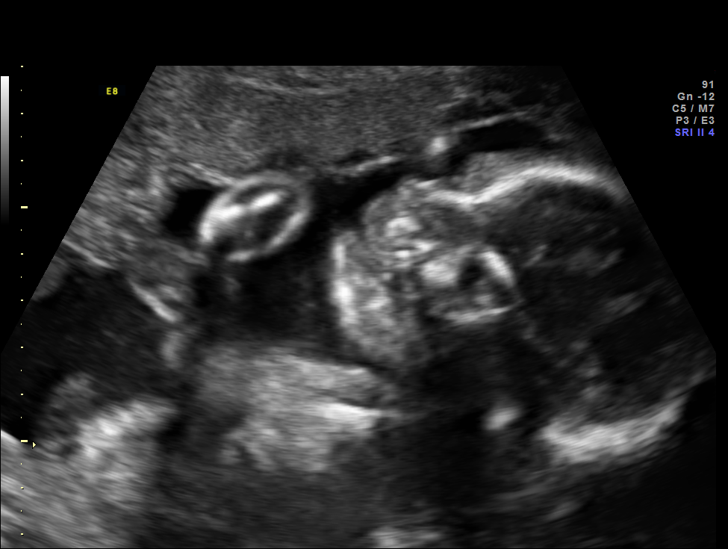
[im 5/21]
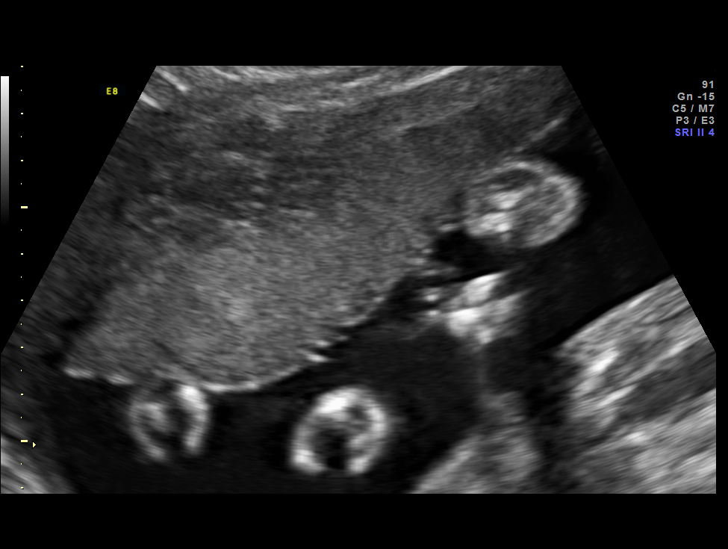
[im 6/21]
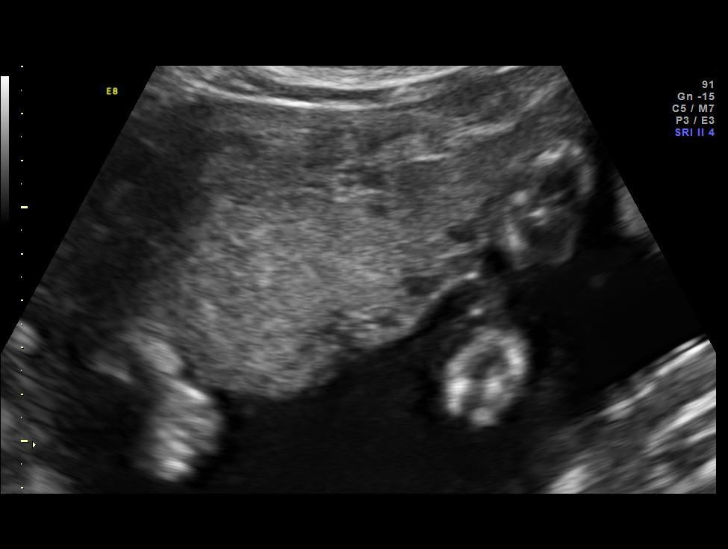
[im 8/21]
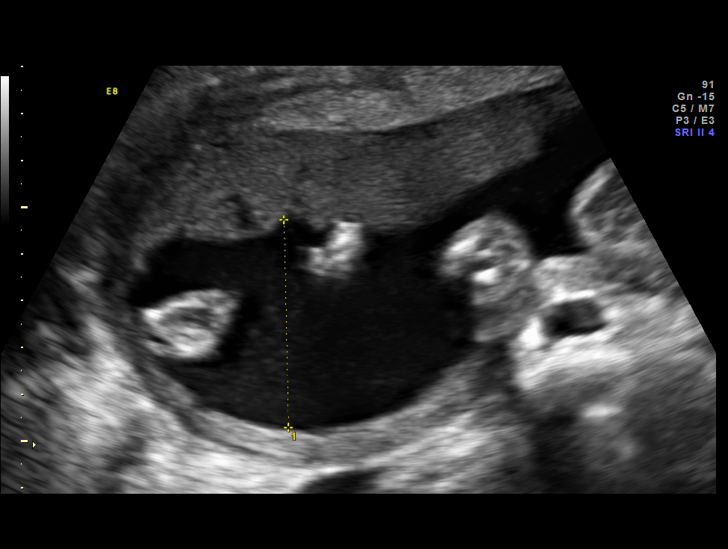
[im 9/21]
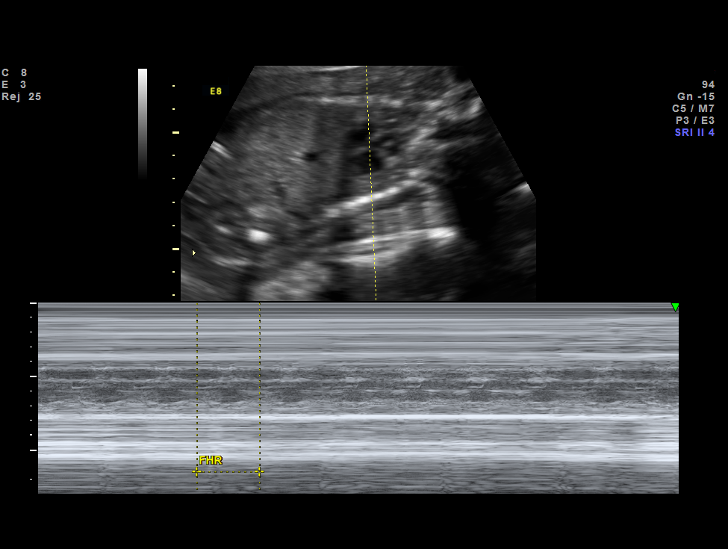
[im 11/21]
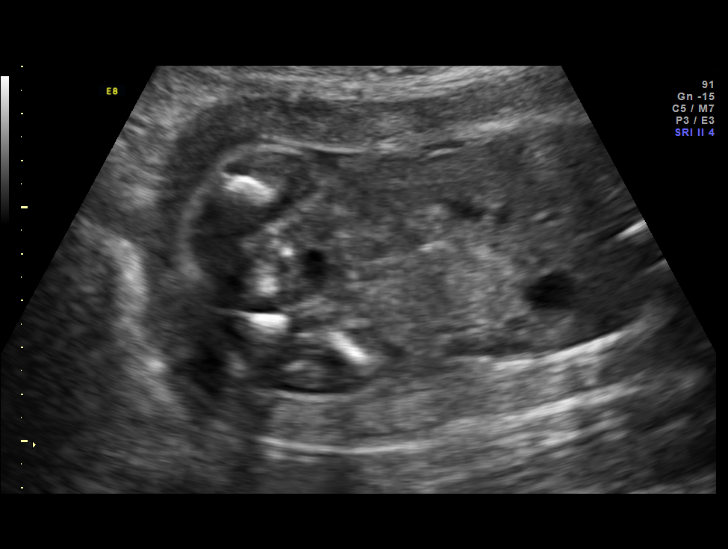
[im 13/21]
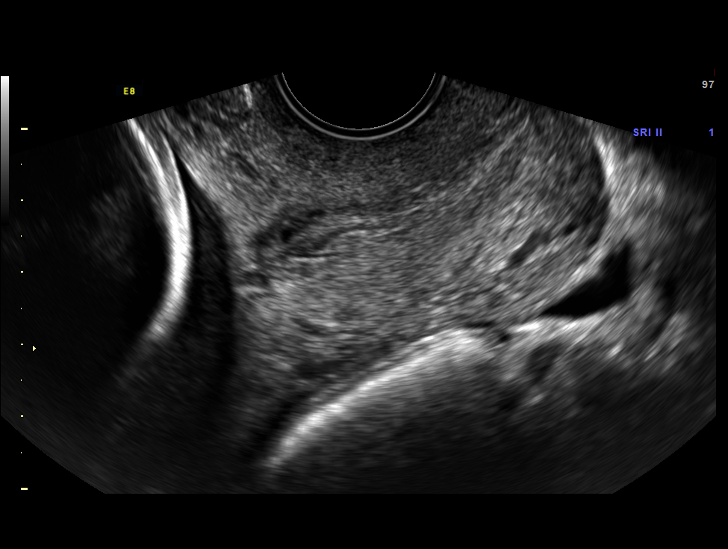
[im 14/21]
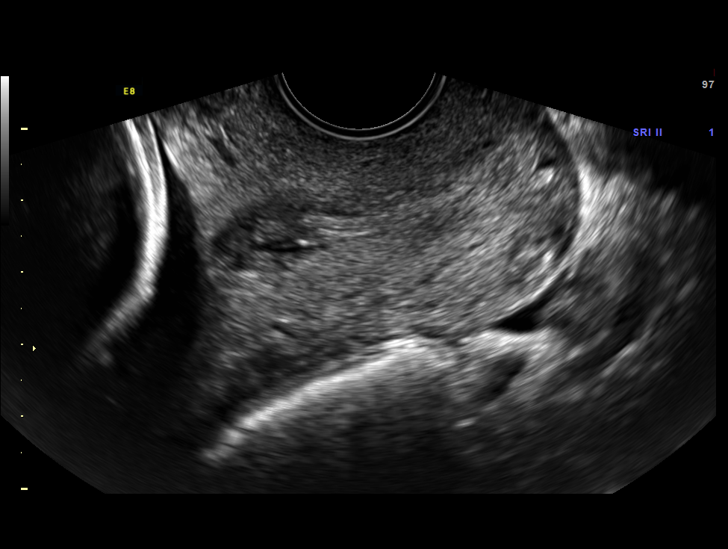
[im 16/21]
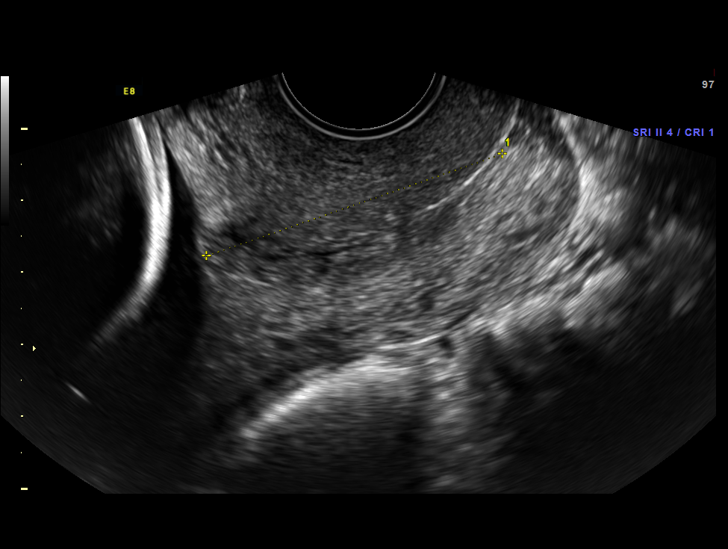
[im 17/21]
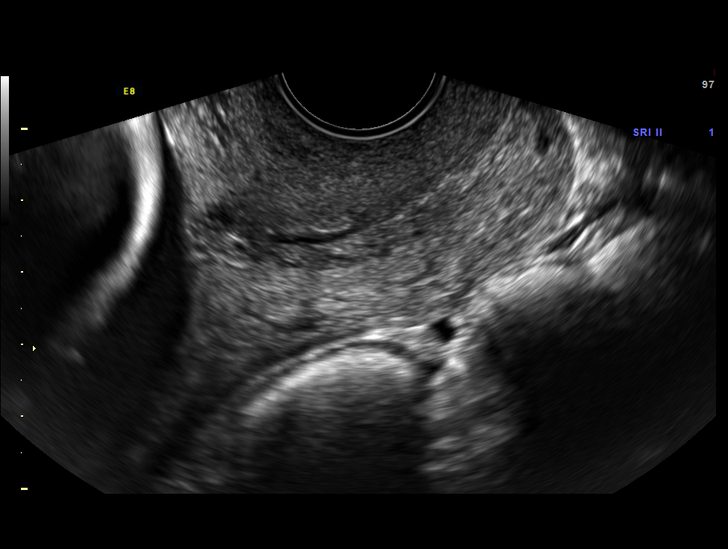
[im 19/21]
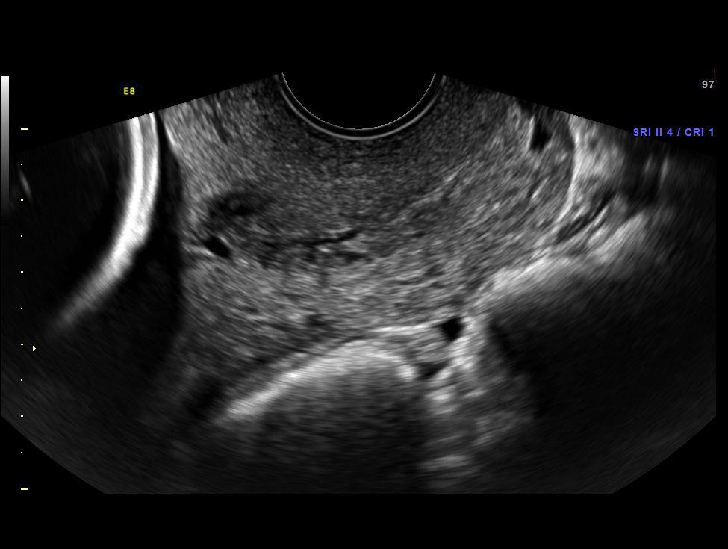
[im 21/21]
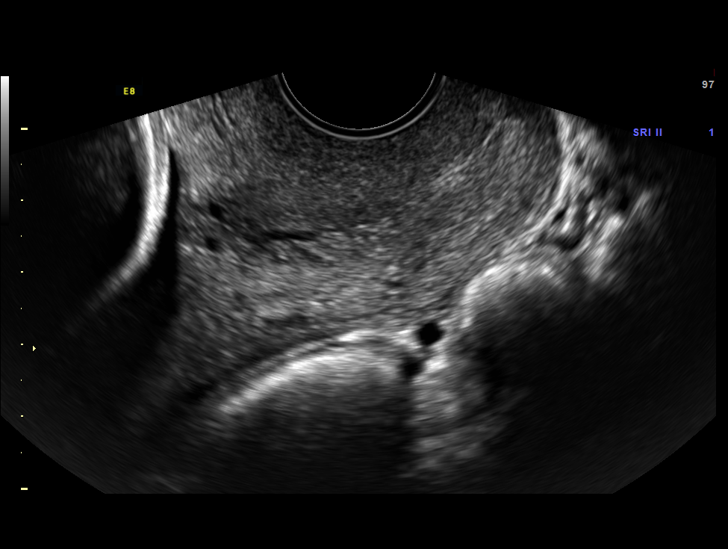

[13 of 21 positions shown; findings below may reference images not displayed]

OBSTETRICS REPORT
                      (Signed Final 04/09/2013 [DATE])

Service(s) Provided

 US MFM OB TRANSVAGINAL                                76817.2
Indications

 History of genetic / anatomic abnormality - tethered
 cord (patient); S/P surgery
 Poor obstetric history: Previous preeclampsia
 Poor obstetric history-Recurrent (habitual) abortion
 (5 consecutive early sab's)
 Poor obstetric history: Previous midtrimester loss
 (20 weeks)
Fetal Evaluation

 Num Of Fetuses:    1
 Fetal Heart Rate:  161                          bpm
 Cardiac Activity:  Observed
 Presentation:      Cephalic
 Placenta:          Anterior, above cervical os
 P. Cord            Previously Visualized
 Insertion:

 Amniotic Fluid
 AFI FV:      Subjectively within normal limits
                                              Larg Pckt:    4.5  cm
Gestational Age

 LMP:           25w 1d        Date:  10/15/12                 EDD:    07/22/13
 Best:          25w 1d     Det. By:  LMP  (10/15/12)          EDD:    07/22/13
Cervix Uterus Adnexa

 Cervical Length:    4.6       cm

 Cervix:       Normal appearance by transabdominal scan. Appears
               closed, without funnelling.
Impression

 IUP at 25+1 weeks
 Normal amniotic fluid volume
 EV views of cervix: normal length without funneling
Recommendations

 Follow-up ultrasound for growth and CL in 2 weeks

 questions or concerns.

## 2016-01-05 ENCOUNTER — Encounter (HOSPITAL_COMMUNITY): Payer: Self-pay | Admitting: Pharmacy Technician

## 2016-01-05 DIAGNOSIS — R1084 Generalized abdominal pain: Secondary | ICD-10-CM | POA: Diagnosis present

## 2016-01-05 DIAGNOSIS — N12 Tubulo-interstitial nephritis, not specified as acute or chronic: Secondary | ICD-10-CM | POA: Insufficient documentation

## 2016-01-05 LAB — COMPREHENSIVE METABOLIC PANEL
ALK PHOS: 41 U/L (ref 38–126)
ALT: 24 U/L (ref 14–54)
ANION GAP: 10 (ref 5–15)
AST: 28 U/L (ref 15–41)
Albumin: 3.7 g/dL (ref 3.5–5.0)
BUN: 7 mg/dL (ref 6–20)
CALCIUM: 9.3 mg/dL (ref 8.9–10.3)
CO2: 23 mmol/L (ref 22–32)
CREATININE: 0.77 mg/dL (ref 0.44–1.00)
Chloride: 109 mmol/L (ref 101–111)
Glucose, Bld: 111 mg/dL — ABNORMAL HIGH (ref 65–99)
Potassium: 3.9 mmol/L (ref 3.5–5.1)
SODIUM: 142 mmol/L (ref 135–145)
Total Bilirubin: 0.7 mg/dL (ref 0.3–1.2)
Total Protein: 7.1 g/dL (ref 6.5–8.1)

## 2016-01-05 LAB — CBC
HCT: 37.4 % (ref 36.0–46.0)
HEMOGLOBIN: 13.4 g/dL (ref 12.0–15.0)
MCH: 26.4 pg (ref 26.0–34.0)
MCHC: 35.8 g/dL (ref 30.0–36.0)
MCV: 73.8 fL — ABNORMAL LOW (ref 78.0–100.0)
PLATELETS: 253 10*3/uL (ref 150–400)
RBC: 5.07 MIL/uL (ref 3.87–5.11)
RDW: 13.5 % (ref 11.5–15.5)
WBC: 9.7 10*3/uL (ref 4.0–10.5)

## 2016-01-05 LAB — LIPASE, BLOOD: LIPASE: 26 U/L (ref 11–51)

## 2016-01-05 NOTE — ED Triage Notes (Signed)
Pt reports to the ED with reports of abdominal pain and vomiting since yesterday. Pt states she has a hx of uti's and "can tell I have one now". Patient denies diarrhea. Pt states she feels as if she will pass out from the pain. Pt also reports cold chills.

## 2016-01-06 ENCOUNTER — Emergency Department (HOSPITAL_COMMUNITY)
Admission: EM | Admit: 2016-01-06 | Discharge: 2016-01-06 | Disposition: A | Discharge status: Home / Self Care | Payer: BLUE CROSS/BLUE SHIELD | Attending: Emergency Medicine | Admitting: Emergency Medicine

## 2016-01-06 ENCOUNTER — Emergency Department (HOSPITAL_COMMUNITY): Payer: BLUE CROSS/BLUE SHIELD

## 2016-01-06 DIAGNOSIS — N12 Tubulo-interstitial nephritis, not specified as acute or chronic: Secondary | ICD-10-CM

## 2016-01-06 LAB — URINALYSIS, ROUTINE W REFLEX MICROSCOPIC
BILIRUBIN URINE: NEGATIVE
Glucose, UA: NEGATIVE mg/dL
HGB URINE DIPSTICK: NEGATIVE
KETONES UR: 40 mg/dL — AB
NITRITE: NEGATIVE
PH: 7 (ref 5.0–8.0)
Protein, ur: 100 mg/dL — AB
Specific Gravity, Urine: 1.019 (ref 1.005–1.030)

## 2016-01-06 LAB — URINE MICROSCOPIC-ADD ON: RBC / HPF: NONE SEEN RBC/hpf (ref 0–5)

## 2016-01-06 LAB — I-STAT BETA HCG BLOOD, ED (MC, WL, AP ONLY): I-stat hCG, quantitative: <5 m[IU]/mL (ref <5)

## 2016-01-06 MED ORDER — SODIUM CHLORIDE 0.9 % IV BOLUS (SEPSIS)
1000.0000 mL | Freq: Once | INTRAVENOUS | Status: AC
  Administered 2016-01-06: 1000 mL via INTRAVENOUS

## 2016-01-06 MED ORDER — CIPROFLOXACIN HCL 500 MG PO TABS: 500.0000 mg | ORAL_TABLET | Freq: Two times a day (BID) | ORAL | 0 refills | Status: DC

## 2016-01-06 MED ORDER — ONDANSETRON HCL 4 MG PO TABS: 4.0000 mg | ORAL_TABLET | Freq: Four times a day (QID) | ORAL | 0 refills | Status: DC

## 2016-01-06 MED ORDER — CIPROFLOXACIN IN D5W 400 MG/200ML IV SOLN
400.0000 mg | Freq: Once | INTRAVENOUS | Status: AC
  Administered 2016-01-06: 400 mg via INTRAVENOUS
  Filled 2016-01-06: qty 200

## 2016-01-06 MED ORDER — HYDROCODONE-ACETAMINOPHEN 5-325 MG PO TABS: 1.0000 | ORAL_TABLET | ORAL | 0 refills | Status: DC | PRN

## 2016-01-06 MED ORDER — ONDANSETRON HCL 4 MG/2ML IJ SOLN
4.0000 mg | Freq: Once | INTRAMUSCULAR | Status: AC
  Administered 2016-01-06: 4 mg via INTRAVENOUS
  Filled 2016-01-06: qty 2

## 2016-01-06 NOTE — ED Notes (Signed)
Patient transported to CT for renal study

## 2016-01-06 NOTE — ED Notes (Signed)
Received report from Bonnie RN.

## 2016-01-06 NOTE — Discharge Instructions (Signed)
Take medications as prescribed. Follow-up with your primary doctor next week. Return as needed for worsening symptoms.

## 2016-01-06 NOTE — ED Provider Notes (Signed)
[facility] DEPT Provider Note   CSN: [id number] Arrival date & time: 01/05/16  2233  By signing my name below, I, Hansel Feinstein, attest that this documentation has been prepared under the direction and in the presence of Ezequiel Essex, MD. Electronically Signed: Hansel Feinstein, ED Scribe. 01/06/16. 1:44 AM.    History   Chief Complaint Chief Complaint  Patient presents with  . Abdominal Pain    HPI Hayley Calhoun is a 30 y.o. female with h/o neurogenic bladder and self catheterization on PRN trimethoprim, renal calculi  who presents to the Emergency Department complaining of moderate generalized abdominal pain onset 3 days ago with associated emesis (x6 today), diarrhea, chills, HA. Pt states her emesis initially contained food, then became yellow. Pt denies bloody or bilious emesis or stools. Pt states she tried alka seltzer cold and flu d/t having cough and congestion last week with no relief of symptoms. She states her cough and congestion are currently resolved. Pt states her current symptoms are similar to prior UTIs, noting that her urine is cloudy. She reports her symptoms are not similar to prior h/o renal calculi. No suspicious food intake. No h/o abdominal surgeries. No known sick contacts with similar symptoms. No recent travel outside the country. She denies fever, dysuria, vaginal discharge, sore throat.   The history is provided by the patient. No language interpreter was used.    Past Medical History:  Diagnosis Date  . Bladder disorder   . Kidney stone   . Kidney stones   . Pregnant   . Spina bifida   . UTI (lower urinary tract infection)     Patient Active Problem List   Diagnosis Date Noted  . Frequent UTI 07/09/2013  . Spina bifida ([place]) 06/06/2012  . 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 Chronic constipation 05/01/2012  . URINARY RETENTION 02/10/2009    Past Surgical History:  Procedure Laterality Date  . bladder botox    . INTERSTIM  IMPLANT PLACEMENT    . LITHOTRIPSY    . spina bifida    . WISDOM TOOTH EXTRACTION      OB History    Gravida Para Term Preterm AB Living   9 3 3  0 6 3   SAB TAB Ectopic Multiple Live Births   6 0 0 0 3       Home Medications    Prior to Admission medications   Medication Sig Start Date End Date Taking? Authorizing Provider  ciprofloxacin (CIPRO) 500 MG tablet Take 1 tablet (500 mg total) by mouth every 12 (twelve) hours. 02/26/15   Lezlie Lye, NP  etonogestrel-ethinyl estradiol (NUVARING) 0.12-0.015 MG/24HR vaginal ring Insert vaginally and leave in place for 3 consecutive weeks, then remove for 1 week. 03/16/15   Woodroe Mode, MD  ibuprofen (ADVIL,MOTRIN) 600 MG tablet Take 1 tablet (600 mg total) by mouth every 6 (six) hours. 07/15/13   Kassie Mends, MD  Multiple Vitamins-Calcium (ONE-A-DAY WOMENS PO) Take 1 tablet by mouth daily.    Historical Provider, MD    Family History Family History  Problem Relation Age of Onset  . Scoliosis Mother   . Other Neg Hx     Social History Social History  Substance Use Topics  . Smoking status: Never Smoker  . Smokeless tobacco: Never Used  . Alcohol use No     Allergies   Sulfa antibiotics; Keflex [cephalexin]; Nitrofurantoin monohyd macro; and Penicillins   Review of Systems Review of Systems A  complete 10 system review of systems was obtained and all systems are negative except as noted in the HPI and PMH.    Physical Exam Updated Vital Signs BP (!) 144/106 (BP Location: Right Arm)   Pulse (!) 122   Temp 97.8 F (36.6 C) (Oral)   Resp 20   Ht 5\' 4"  (1.626 m)   Wt 148 lb (67.1 kg)   SpO2 100%   BMI 25.40 kg/m   Physical Exam  Constitutional: She is oriented to person, place, and time. She appears well-developed and well-nourished. No distress.  HENT:  Head: Normocephalic and atraumatic.  Mouth/Throat: No oropharyngeal exudate.  Dry mucous membranes.   Eyes: Conjunctivae and EOM are normal. Pupils are equal,  round, and reactive to light.  Neck: Normal range of motion. Neck supple.  No meningismus.  Cardiovascular: Regular rhythm, normal heart sounds and intact distal pulses.   No murmur heard. Tachycardic to the 120's.   Pulmonary/Chest: Effort normal and breath sounds normal. No respiratory distress.  Abdominal: Soft. There is tenderness. There is no rebound and no guarding.  Mild diffuse abdominal tenderness. No CVA tenderness.   Musculoskeletal: Normal range of motion. She exhibits no edema or tenderness.  Neurological: She is alert and oriented to person, place, and time. No cranial nerve deficit. She exhibits normal muscle tone. Coordination normal.  No ataxia on finger to nose bilaterally. No pronator drift. 5/5 strength throughout. CN 2-12 intact.Equal grip strength. Sensation intact.   Skin: Skin is warm.  Psychiatric: She has a normal mood and affect. Her behavior is normal.  Nursing note and vitals reviewed.    ED Treatments / Results   DIAGNOSTIC STUDIES: Oxygen Saturation is 100% on RA, normal by my interpretation.    COORDINATION OF CARE: 1:43 AM Discussed treatment plan with pt at bedside which includes lab work and pt agreed to plan.    Labs (all labs ordered are listed, but only abnormal results are displayed) Labs Reviewed  COMPREHENSIVE METABOLIC PANEL - Abnormal; Notable for the following:       Result Value   Glucose, Bld 111 (*)    All other components within normal limits  CBC - Abnormal; Notable for the following:    MCV 73.8 (*)    All other components within normal limits  URINALYSIS, ROUTINE W REFLEX MICROSCOPIC (NOT AT [facility]) - Abnormal; Notable for the following:    APPearance CLOUDY (*)    Ketones, ur 40 (*)    Protein, ur 100 (*)    Leukocytes, UA SMALL (*)    All other components within normal limits  URINE MICROSCOPIC-ADD ON - Abnormal; Notable for the following:    Squamous Epithelial / LPF 0-5 (*)    Bacteria, UA RARE (*)    All other  components within normal limits  URINE CULTURE  LIPASE, BLOOD  I-STAT BETA HCG BLOOD, ED ([facility], [facility], [facility] ONLY)    EKG  EKG Interpretation None       Radiology No results found.  Procedures Procedures (including critical care time)  Medications Ordered in ED Medications - No data to display   Initial Impression / Assessment and Plan / ED Course  I have reviewed the triage vital signs and the nursing notes.  Pertinent labs & imaging results that were available during my care of the patient were reviewed by me and considered in my medical decision making (see chart for details).  Clinical Course    Patient with history of neurogenic bladder presenting with  abdominal pain and vomiting since yesterday. No fever. Has had chills.  She is tachycardic, afebrile. Urinalysis consistent with infection. Culture will be sent. Started on Cipro given her multiple allergies. CT will be obtained to evaluate for kidney stone.  Tachycardia is improving after 2 L of fluid in the ED. Blood pressure stable.  Labs otherwise reassuring.  HR 70s.  Patient nontoxic appearing.   Care transferred to Dr. Tomi Bamberger at shift change.  CT pending to evaluate for infected stone. Anticipate discharge home with cipro if reassuring.  Final Clinical Impressions(s) / ED Diagnoses   Final diagnoses:  None    New Prescriptions New Prescriptions   No medications on file   I personally performed the services described in this documentation, which was scribed in my presence. The recorded information has been reviewed and is accurate.    Ezequiel Essex, MD 01/06/16 [phone]

## 2016-01-06 NOTE — ED Notes (Addendum)
Patient performing self catherization for urine specimen at this time.

## 2016-01-06 NOTE — ED Notes (Signed)
Returned from CT.

## 2016-01-06 NOTE — ED Provider Notes (Signed)
Reviewed the CT scan findings with the patient and her family member.  Discussed the adnexal finding.   Most likely related to the cystitis.  Will dc home with abx and planned outpatient follow up   Dorie Rank, MD 01/06/16 1010

## 2016-01-06 NOTE — ED Notes (Signed)
Patient provided cup of water for fluid challenge.

## 2016-01-08 LAB — URINE CULTURE: Culture: 50000 — AB

## 2016-01-09 NOTE — Progress Notes (Signed)
ED Antimicrobial Stewardship Positive Culture Follow Up   Hayley Calhoun is an 30 y.o. female who presented to [facility] on 01/06/2016 with a chief complaint of  Chief Complaint  Patient presents with  . Abdominal Pain    Recent Results (from the past 720 hour(s))  Urine culture     Status: Abnormal   Collection Time: 01/06/16  4:50 AM  Result Value Ref Range Status   Specimen Description URINE, CATHETERIZED  Final   Special Requests NONE  Final   Culture (A)  Final    50,000 COLONIES/mL STAPHYLOCOCCUS SPECIES (COAGULASE NEGATIVE)   Report Status 01/08/2016 FINAL  Final   Organism ID, Bacteria STAPHYLOCOCCUS SPECIES (COAGULASE NEGATIVE) (A)  Final      Susceptibility   Staphylococcus species (coagulase negative) - MIC*    CIPROFLOXACIN 2 INTERMEDIATE Intermediate     GENTAMICIN <=0.5 SENSITIVE Sensitive     NITROFURANTOIN <=16 SENSITIVE Sensitive     OXACILLIN >=4 RESISTANT Resistant     TETRACYCLINE <=1 SENSITIVE Sensitive     VANCOMYCIN 2 SENSITIVE Sensitive     TRIMETH/SULFA 80 RESISTANT Resistant     CLINDAMYCIN <=0.25 SENSITIVE Sensitive     RIFAMPIN <=0.5 SENSITIVE Sensitive     Inducible Clindamycin NEGATIVE Sensitive     * 50,000 COLONIES/mL STAPHYLOCOCCUS SPECIES (COAGULASE NEGATIVE)    [x]  Treated with ciprofloxacin, organism intermediate to prescribed antimicrobial; however, given patient's history of self catheterization this MRSE likely represents a colonization rather than an infection. No further changes to antibiotics is warranted.   ED Provider: Gloriann Loan, PA  Carlean Jews, Pharm.D. PGY1 Pharmacy Resident 11/27/20178:46 AM Pager [phone] Phone# [phone]

## 2016-05-08 LAB — LAB REPORT - SCANNED: Preg Test, Ur: POSITIVE

## 2016-05-16 ENCOUNTER — Encounter: Payer: Self-pay | Admitting: *Deleted

## 2016-05-19 ENCOUNTER — Encounter (HOSPITAL_COMMUNITY): Payer: Self-pay | Admitting: *Deleted

## 2016-05-19 ENCOUNTER — Inpatient Hospital Stay (HOSPITAL_COMMUNITY)
Admission: AD | Admit: 2016-05-19 | Discharge: 2016-05-19 | Disposition: A | Discharge status: Home / Self Care | Payer: Medicaid Other | Source: Ambulatory Visit | Attending: Obstetrics and Gynecology | Admitting: Obstetrics and Gynecology

## 2016-05-19 DIAGNOSIS — O2340 Unspecified infection of urinary tract in pregnancy, unspecified trimester: Secondary | ICD-10-CM | POA: Insufficient documentation

## 2016-05-19 DIAGNOSIS — Z87440 Personal history of urinary (tract) infections: Secondary | ICD-10-CM | POA: Diagnosis not present

## 2016-05-19 DIAGNOSIS — Z3A00 Weeks of gestation of pregnancy not specified: Secondary | ICD-10-CM | POA: Diagnosis not present

## 2016-05-19 DIAGNOSIS — N3001 Acute cystitis with hematuria: Secondary | ICD-10-CM

## 2016-05-19 DIAGNOSIS — Z880 Allergy status to penicillin: Secondary | ICD-10-CM | POA: Insufficient documentation

## 2016-05-19 DIAGNOSIS — N390 Urinary tract infection, site not specified: Secondary | ICD-10-CM

## 2016-05-19 DIAGNOSIS — R300 Dysuria: Secondary | ICD-10-CM | POA: Diagnosis present

## 2016-05-19 DIAGNOSIS — R1110 Vomiting, unspecified: Secondary | ICD-10-CM | POA: Diagnosis present

## 2016-05-19 DIAGNOSIS — Q057 Lumbar spina bifida without hydrocephalus: Secondary | ICD-10-CM

## 2016-05-19 LAB — URINALYSIS, ROUTINE W REFLEX MICROSCOPIC
BACTERIA UA: NONE SEEN
Bilirubin Urine: NEGATIVE
Glucose, UA: NEGATIVE mg/dL
Hgb urine dipstick: NEGATIVE
KETONES UR: 20 mg/dL — AB
NITRITE: POSITIVE — AB
PH: 6 (ref 5.0–8.0)
Protein, ur: 100 mg/dL — AB
SPECIFIC GRAVITY, URINE: 1.021 (ref 1.005–1.030)

## 2016-05-19 LAB — CBC
HCT: 31.8 % — ABNORMAL LOW (ref 36.0–46.0)
HEMOGLOBIN: 10.6 g/dL — AB (ref 12.0–15.0)
MCH: 20.9 pg — ABNORMAL LOW (ref 26.0–34.0)
MCHC: 33.3 g/dL (ref 30.0–36.0)
MCV: 62.8 fL — AB (ref 78.0–100.0)
PLATELETS: 309 10*3/uL (ref 150–400)
RBC: 5.06 MIL/uL (ref 3.87–5.11)
RDW: 19.7 % — ABNORMAL HIGH (ref 11.5–15.5)
WBC: 5.5 10*3/uL (ref 4.0–10.5)

## 2016-05-19 LAB — POCT PREGNANCY, URINE: Preg Test, Ur: POSITIVE — AB

## 2016-05-19 MED ORDER — PROMETHAZINE HCL 25 MG/ML IJ SOLN
12.5000 mg | Freq: Once | INTRAMUSCULAR | Status: AC
  Administered 2016-05-19: 12.5 mg via INTRAVENOUS
  Filled 2016-05-19: qty 1

## 2016-05-19 MED ORDER — PROMETHAZINE HCL 12.5 MG PO TABS: 12.5000 mg | ORAL_TABLET | Freq: Four times a day (QID) | ORAL | 0 refills | Status: DC | PRN

## 2016-05-19 MED ORDER — CEPHALEXIN 250 MG PO CAPS: 250.0000 mg | ORAL_CAPSULE | Freq: Every day | ORAL | 6 refills | Status: DC

## 2016-05-19 MED ORDER — LACTATED RINGERS IV BOLUS (SEPSIS)
1000.0000 mL | Freq: Once | INTRAVENOUS | Status: AC
  Administered 2016-05-19: 1000 mL via INTRAVENOUS

## 2016-05-19 MED ORDER — CEPHALEXIN 500 MG PO CAPS: 500.0000 mg | ORAL_CAPSULE | Freq: Four times a day (QID) | ORAL | 0 refills | Status: DC

## 2016-05-19 NOTE — MAU Note (Signed)
Patient presents with constant vomiting x 2 weeks, feels dizzy, had UPT done which was positive.

## 2016-05-19 NOTE — MAU Provider Note (Signed)
History     CSN: [id number]  Arrival date and time: 05/19/16 1203   First Provider Initiated Contact with Patient 05/19/16 1300      Chief Complaint  Patient presents with  . Emesis  . Dysuria   31 y/o G10P3 who presents today with concerns for UTI and nausea and vomiting. 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 She has chronic UTIs but was on trimethoprim for prophylaxis, until before she was pregnant. Once she got pregnant she was instructed to stop this medicine, but did not start anything else. She reports she has had chills, but no fever. She has severe nausea and has been unable to keep anything down for the past 48 hours. She has tried zofran ODT without significant relief in her symptoms. She denies back/flank pain.    OB History    Gravida Para Term Preterm AB Living   10 3 3  0 6 3   SAB TAB Ectopic Multiple Live Births   6 0 0 0 3      Past Medical History:  Diagnosis Date  . Bladder disorder   . Kidney stone   . Kidney stones   . Pregnant   . Spina bifida   . UTI (lower urinary tract infection)     Past Surgical History:  Procedure Laterality Date  . bladder botox    . INTERSTIM IMPLANT PLACEMENT    . LITHOTRIPSY    . spina bifida    . WISDOM TOOTH EXTRACTION      Family History  Problem Relation Age of Onset  . Scoliosis Mother   . Other Neg Hx     Social History  Substance Use Topics  . Smoking status: Never Smoker  . Smokeless tobacco: Never Used  . Alcohol use No    Allergies:  Allergies  Allergen Reactions  . Sulfa Antibiotics Shortness Of Breath and Itching  . Keflex [Cephalexin] Nausea And Vomiting  . Nitrofurantoin Monohyd Macro Nausea And Vomiting  . Penicillins Other (See Comments)    Reaction:  Unknown  Has patient had a PCN reaction causing immediate rash, facial/tongue/throat swelling, SOB or lightheadedness with hypotension: Unsure Has patient had a PCN reaction causing  severe rash involving mucus membranes or skin necrosis: Unsure Has patient had a PCN reaction that required hospitalization Unsure Has patient had a PCN reaction occurring within the last 10 years: No If all of the above answers are "NO", then may proceed with Cephalosporin use. Childhood reaction unknown    No prescriptions prior to admission.    Review of Systems  Constitutional: Negative for chills and fever.  HENT: Negative for congestion and rhinorrhea.   Respiratory: Negative for cough and shortness of breath.   Cardiovascular: Negative for chest pain and palpitations.  Gastrointestinal: Positive for nausea and vomiting. Negative for abdominal distention, abdominal pain, constipation and diarrhea.  Genitourinary: Positive for difficulty urinating, dysuria and hematuria. Negative for enuresis and flank pain.  Neurological: Negative for dizziness and weakness.   Physical Exam   Blood pressure 123/81, pulse 90, temperature 98.2 F (36.8 C), temperature source Oral, resp. rate 16, height 5\' 4"  (1.626 m), weight 136 lb 1.9 oz (61.7 kg), last menstrual period 04/03/2016.  Physical Exam  Vitals reviewed. Constitutional: She is oriented to person, place, and time. She appears well-developed and well-nourished.  HENT:  Head: Normocephalic and atraumatic.  Cardiovascular: Normal rate and intact distal pulses.   Respiratory: Effort normal. No respiratory  distress.  GI: Soft. She exhibits no distension. There is no tenderness. There is no rebound and no guarding.  Musculoskeletal: Normal range of motion. She exhibits no edema.  Neurological: She is alert and oriented to person, place, and time.  Skin: Skin is warm and dry. No erythema.  Psychiatric: She has a normal mood and affect. Her behavior is normal.    MAU Course  Procedures  MDM In MAU patient had a Urinalysis that looked grossly infected with pyuria, hematuria, bacturia and nitrite positive.  She was started on Keflex  500mg  quid. After completion of her course of treatment I started the patient on 250mg  nightly.   She denied fever and had a normal cbc so was discharged home. Her urine was sent to the lab for culture.   She had a liter of fluid and 12.5 mg of phenergan in MAU with improvement of her symptoms and was able tolerate crackers and sprite.   Assessment and Plan  1. UTI in pregnancy: Start keflex 500mg  4x a day. Given phenergan to help with nausea.  2. Recurrent UTI prior to pregnancy: started on keflex suppress after completion of treatment course.    Hayley Calhoun 05/19/2016, 3:06 PM

## 2016-05-21 LAB — CULTURE, OB URINE: Culture: >=100000 — AB

## 2016-05-23 ENCOUNTER — Inpatient Hospital Stay (HOSPITAL_COMMUNITY)
Admission: AD | Admit: 2016-05-23 | Discharge: 2016-05-23 | Disposition: A | Discharge status: Home / Self Care | Payer: Medicaid Other | Source: Ambulatory Visit | Attending: Family Medicine | Admitting: Family Medicine

## 2016-05-23 ENCOUNTER — Inpatient Hospital Stay (HOSPITAL_COMMUNITY): Payer: Medicaid Other

## 2016-05-23 ENCOUNTER — Encounter (HOSPITAL_COMMUNITY): Payer: Self-pay | Admitting: *Deleted

## 2016-05-23 DIAGNOSIS — R112 Nausea with vomiting, unspecified: Secondary | ICD-10-CM | POA: Insufficient documentation

## 2016-05-23 DIAGNOSIS — O219 Vomiting of pregnancy, unspecified: Secondary | ICD-10-CM

## 2016-05-23 DIAGNOSIS — Z87440 Personal history of urinary (tract) infections: Secondary | ICD-10-CM | POA: Diagnosis not present

## 2016-05-23 DIAGNOSIS — R109 Unspecified abdominal pain: Secondary | ICD-10-CM | POA: Diagnosis present

## 2016-05-23 DIAGNOSIS — O2340 Unspecified infection of urinary tract in pregnancy, unspecified trimester: Secondary | ICD-10-CM | POA: Diagnosis not present

## 2016-05-23 DIAGNOSIS — Z3A01 Less than 8 weeks gestation of pregnancy: Secondary | ICD-10-CM | POA: Diagnosis not present

## 2016-05-23 DIAGNOSIS — N390 Urinary tract infection, site not specified: Secondary | ICD-10-CM

## 2016-05-23 DIAGNOSIS — O26891 Other specified pregnancy related conditions, first trimester: Secondary | ICD-10-CM | POA: Diagnosis present

## 2016-05-23 LAB — URINALYSIS, ROUTINE W REFLEX MICROSCOPIC
BILIRUBIN URINE: NEGATIVE
Bacteria, UA: NONE SEEN
Glucose, UA: NEGATIVE mg/dL
HGB URINE DIPSTICK: NEGATIVE
KETONES UR: 20 mg/dL — AB
Nitrite: NEGATIVE
PROTEIN: 30 mg/dL — AB
Specific Gravity, Urine: 1.023 (ref 1.005–1.030)
pH: 7 (ref 5.0–8.0)

## 2016-05-23 LAB — WET PREP, GENITAL
Clue Cells Wet Prep HPF POC: NONE SEEN
Sperm: NONE SEEN
TRICH WET PREP: NONE SEEN
YEAST WET PREP: NONE SEEN

## 2016-05-23 LAB — CBC
HCT: 32.5 % — ABNORMAL LOW (ref 36.0–46.0)
Hemoglobin: 11 g/dL — ABNORMAL LOW (ref 12.0–15.0)
MCH: 21.4 pg — ABNORMAL LOW (ref 26.0–34.0)
MCHC: 33.8 g/dL (ref 30.0–36.0)
MCV: 63.1 fL — ABNORMAL LOW (ref 78.0–100.0)
PLATELETS: 288 10*3/uL (ref 150–400)
RBC: 5.15 MIL/uL — ABNORMAL HIGH (ref 3.87–5.11)
RDW: 20.2 % — ABNORMAL HIGH (ref 11.5–15.5)
WBC: 4.7 10*3/uL (ref 4.0–10.5)

## 2016-05-23 LAB — HCG, QUANTITATIVE, PREGNANCY: HCG, BETA CHAIN, QUANT, S: 178161 m[IU]/mL — AB (ref <5)

## 2016-05-23 MED ORDER — METOCLOPRAMIDE HCL 10 MG PO TABS: 10.0000 mg | ORAL_TABLET | Freq: Three times a day (TID) | ORAL | 1 refills | Status: DC

## 2016-05-23 MED ORDER — LACTATED RINGERS IV BOLUS (SEPSIS)
1000.0000 mL | Freq: Once | INTRAVENOUS | Status: AC
  Administered 2016-05-23: 1000 mL via INTRAVENOUS

## 2016-05-23 MED ORDER — CLINDAMYCIN HCL 300 MG PO CAPS: 300.0000 mg | ORAL_CAPSULE | Freq: Four times a day (QID) | ORAL | 0 refills | Status: DC

## 2016-05-23 MED ORDER — FAMOTIDINE IN NACL 20-0.9 MG/50ML-% IV SOLN
20.0000 mg | Freq: Once | INTRAVENOUS | Status: AC
  Administered 2016-05-23: 20 mg via INTRAVENOUS
  Filled 2016-05-23: qty 50

## 2016-05-23 MED ORDER — METOCLOPRAMIDE HCL 5 MG/ML IJ SOLN
10.0000 mg | Freq: Once | INTRAMUSCULAR | Status: AC
  Administered 2016-05-23: 10 mg via INTRAVENOUS
  Filled 2016-05-23: qty 2

## 2016-05-23 MED ORDER — RANITIDINE HCL 150 MG PO TABS: 150.0000 mg | ORAL_TABLET | Freq: Two times a day (BID) | ORAL | 1 refills | Status: DC

## 2016-05-23 NOTE — MAU Provider Note (Signed)
Chief Complaint: Emesis During Pregnancy and Fatigue   First Provider Initiated Contact with Patient 05/23/16 1150      SUBJECTIVE HPI: Hayley Calhoun is a 31 y.o. [id number] at [redacted]w[redacted]d by LMP who presents to maternity admissions reporting n/v of pregnancy with emesis 4-5 x in 24 hours but she is unable to take her abx for UTI diagnosed 4 days ago.  She has hx of recurrent UTI r/t self catheterization, and urinary retention and stopped her suppressive Bactrim therapy because of the pregnancy. She tried Phenergan for nausea but it makes her sleepy and does not help the nausea. She also reports abdominal cramping x 24 hours that is mild, intermittent, and unchanged since onset. She has not tried any treatments for pain.  Nothing makes it better or worse.  There are no other associated symptoms. She denies vaginal bleeding, vaginal itching/burning, urinary symptoms, h/a, dizziness, or fever/chills.     HPI  Past Medical History:  Diagnosis Date  . Bladder disorder   . Kidney stone   . Kidney stones   . Pregnant   . Spina bifida   . UTI (lower urinary tract infection)    Past Surgical History:  Procedure Laterality Date  . bladder botox    . INTERSTIM IMPLANT PLACEMENT    . LITHOTRIPSY    . spina bifida    . WISDOM TOOTH EXTRACTION     Social History   Social History  . Marital status: Married    Spouse name: N/A  . Number of children: N/A  . Years of education: N/A   Occupational History  . Not on file.   Social History Main Topics  . Smoking status: Never Smoker  . Smokeless tobacco: Never Used  . Alcohol use No  . Drug use: No  . Sexual activity: Yes     Comment: desires Nexplanon   Other Topics Concern  . Not on file   Social History Narrative  . No narrative on file   No current facility-administered medications on file prior to encounter.    Current Outpatient Prescriptions on File Prior to Encounter  Medication Sig Dispense Refill  . cephALEXin (KEFLEX)  250 MG capsule Take 1 capsule (250 mg total) by mouth at bedtime. Start after finsihing 1 wk of QUID (Patient not taking: Reported on 05/23/2016) 30 capsule 6  . cephALEXin (KEFLEX) 500 MG capsule Take 1 capsule (500 mg total) by mouth 4 (four) times daily. (Patient not taking: Reported on 05/23/2016) 28 capsule 0  . promethazine (PHENERGAN) 12.5 MG tablet Take 1 tablet (12.5 mg total) by mouth every 6 (six) hours as needed for nausea or vomiting. (Patient not taking: Reported on 05/23/2016) 30 tablet 0  . [DISCONTINUED] glycopyrrolate (ROBINUL) 1 MG tablet Take 1 tablet (1 mg total) by mouth 3 (three) times daily. 30 tablet 0   Allergies  Allergen Reactions  . Sulfa Antibiotics Shortness Of Breath and Itching  . Keflex [Cephalexin] Nausea And Vomiting  . Nitrofurantoin Monohyd Macro Nausea And Vomiting  . Penicillins Other (See Comments)    Reaction:  Unknown  Has patient had a PCN reaction causing immediate rash, facial/tongue/throat swelling, SOB or lightheadedness with hypotension: Unsure Has patient had a PCN reaction causing severe rash involving mucus membranes or skin necrosis: Unsure Has patient had a PCN reaction that required hospitalization Unsure Has patient had a PCN reaction occurring within the last 10 years: No If all of the above answers are "NO", then may proceed with Cephalosporin use. Childhood  reaction unknown   US Ob Comp Less 14 Wks  Result Date: 05/23/2016 CLINICAL DATA:  Pregnant, abdominal cramping x1 day EXAM: OBSTETRIC <14 WK [place] AND TRANSVAGINAL OB US TECHNIQUE: Both transabdominal and transvaginal ultrasound examinations were performed for complete evaluation of the gestation as well as the maternal uterus, adnexal regions, and pelvic cul-de-sac. Transvaginal technique was performed to assess early pregnancy. COMPARISON:  None. FINDINGS: Intrauterine gestational sac: Single Yolk sac:  Visualized. Embryo:  Visualized. Cardiac Activity: Visualized. Heart Rate: 150  bpm  CRL:  9.2  mm   6 w   6 d                  [place] EDC: 01/10/2017 Subchorionic hemorrhage:  Small subchronic hemorrhage. Maternal uterus/adnexae: Bilateral ovaries are within normal limits, noting a right corpus luteal cyst. Trace pelvic fluid, likely physiologic. IMPRESSION: Single live intrauterine gestation, with estimated gestational age [redacted] weeks 6 days by crown-rump length. Electronically Signed   By: Julian Hy M.D.   On: 05/23/2016 13:38   US Ob Transvaginal  Result Date: 05/23/2016 CLINICAL DATA:  Pregnant, abdominal cramping x1 day EXAM: OBSTETRIC <14 WK [place] AND TRANSVAGINAL OB US TECHNIQUE: Both transabdominal and transvaginal ultrasound examinations were performed for complete evaluation of the gestation as well as the maternal uterus, adnexal regions, and pelvic cul-de-sac. Transvaginal technique was performed to assess early pregnancy. COMPARISON:  None. FINDINGS: Intrauterine gestational sac: Single Yolk sac:  Visualized. Embryo:  Visualized. Cardiac Activity: Visualized. Heart Rate: 150  bpm CRL:  9.2  mm   6 w   6 d                  [place] EDC: 01/10/2017 Subchorionic hemorrhage:  Small subchronic hemorrhage. Maternal uterus/adnexae: Bilateral ovaries are within normal limits, noting a right corpus luteal cyst. Trace pelvic fluid, likely physiologic. IMPRESSION: Single live intrauterine gestation, with estimated gestational age [redacted] weeks 6 days by crown-rump length. Electronically Signed   By: Julian Hy M.D.   On: 05/23/2016 13:38   ROS:  Review of Systems  Constitutional: Negative for chills, fatigue and fever.  Respiratory: Negative for shortness of breath.   Cardiovascular: Negative for chest pain.  Gastrointestinal: Positive for nausea and vomiting.  Genitourinary: Positive for pelvic pain. Negative for difficulty urinating, dysuria, flank pain, vaginal bleeding, vaginal discharge and vaginal pain.  Neurological: Negative for dizziness and headaches.  Psychiatric/Behavioral:  Negative.    I have reviewed patient's Past Medical Hx, Surgical Hx, Family Hx, Social Hx, medications and allergies.   Physical Exam  Patient Vitals for the past 24 hrs:  BP Temp Temp src Pulse Resp  05/23/16 1014 126/84 98.3 F (36.8 C) Oral 82 16   Constitutional: Well-developed, well-nourished female in no acute distress.  Cardiovascular: normal rate Respiratory: normal effort GI: Abd soft, non-tender. Pos BS x 4 MS: Extremities nontender, no edema, normal ROM Neurologic: Alert and oriented x 4.  GU: Neg CVAT.  PELVIC EXAM: Cervix pink, visually closed, without lesion, scant white creamy discharge, vaginal walls and external genitalia normal Bimanual exam: Cervix 0/long/high, firm, anterior, neg CMT, uterus nontender, nonenlarged, adnexa without tenderness, enlargement, or mass    LAB RESULTS Results for orders placed or performed during the hospital encounter of 05/23/16 (from the past 24 hour(s))  Urinalysis, Routine w reflex microscopic     Status: Abnormal   Collection Time: 05/23/16 10:16 AM  Result Value Ref Range   Color, Urine YELLOW YELLOW   APPearance CLOUDY (A) CLEAR  Specific Gravity, Urine 1.023 1.005 - 1.030   pH 7.0 5.0 - 8.0   Glucose, UA NEGATIVE NEGATIVE mg/dL   Hgb urine dipstick NEGATIVE NEGATIVE   Bilirubin Urine NEGATIVE NEGATIVE   Ketones, ur 20 (A) NEGATIVE mg/dL   Protein, ur 30 (A) NEGATIVE mg/dL   Nitrite NEGATIVE NEGATIVE   Leukocytes, UA LARGE (A) NEGATIVE   RBC / HPF 0-5 0 - 5 RBC/hpf   WBC, UA TOO NUMEROUS TO COUNT 0 - 5 WBC/hpf   Bacteria, UA NONE SEEN NONE SEEN   Squamous Epithelial / LPF 0-5 (A) NONE SEEN   WBC Clumps PRESENT    Mucous PRESENT    Non Squamous Epithelial 0-5 (A) NONE SEEN  Wet prep, genital     Status: Abnormal   Collection Time: 05/23/16 11:48 AM  Result Value Ref Range   Yeast Wet Prep HPF POC NONE SEEN NONE SEEN   Trich, Wet Prep NONE SEEN NONE SEEN   Clue Cells Wet Prep HPF POC NONE SEEN NONE SEEN   WBC,  Wet Prep HPF POC FEW (A) NONE SEEN   Sperm NONE SEEN   CBC     Status: Abnormal   Collection Time: 05/23/16 12:19 PM  Result Value Ref Range   WBC 4.7 4.0 - 10.5 K/uL   RBC 5.15 (H) 3.87 - 5.11 MIL/uL   Hemoglobin 11.0 (L) 12.0 - 15.0 g/dL   HCT 32.5 (L) 36.0 - 46.0 %   MCV 63.1 (L) 78.0 - 100.0 fL   MCH 21.4 (L) 26.0 - 34.0 pg   MCHC 33.8 30.0 - 36.0 g/dL   RDW 20.2 (H) 11.5 - 15.5 %   Platelets 288 150 - 400 K/uL    MDM UA CBC Quant hcg [place] Wet prep and GC  Report to Noni Saupe, NP  Fatima Blank Certified Nurse-Midwife 05/23/2016  1:15 PM  MDM:  Urine culture from 4/7 shows Staph; susceptibility shows sensitive to clindamycin. Patient is without pyleo symptoms today. N/V medication changed to Zantac and Reglan QID. Discussed urine culture with pharmacy.   A:  SIUP @ [redacted]w[redacted]d  1. Frequent UTI   2. Abdominal pain during pregnancy, first trimester   3. Nausea and vomiting in pregnancy     P:  Discharge home in stable condition Rx: Clindamycin, Zantac, Reglan Patient to call urology tomorrow for discussion of suppression.  Strict return precautions, if nausea persists and unable to keep down clindamycin, patient to return.    Hayley Lye, NP 05/23/2016 3:36 PM

## 2016-05-23 NOTE — Progress Notes (Signed)
GC/Chlamydia culture & wet prep cultures obtained.

## 2016-05-23 NOTE — MAU Note (Signed)
Pt presents with c/o possible UTI.  States was diagnosed w/ UTI 4 days ago, unable to keep abx down d/t vomitting.  States has a Hx of frequent UTI & does self catheterization.  Pt also concerned about "HCG levels dropping", states less symptoms of pregnancy, abd cramping, & hx frequent miscarriages.

## 2016-05-23 NOTE — MAU Note (Signed)
Pt seen in MAU a few days ago, was prescribed antibiotics, unable to hold them down.  Still feels like she has a UTI.  Feeling weak & dizzy.  Denies pain, hx of frequent UTI's.

## 2016-05-23 NOTE — Discharge Instructions (Signed)
Nausea and Vomiting, Adult Feeling sick to your stomach (nausea) means that your stomach is upset or you feel like you have to throw up (vomit). Feeling more and more sick to your stomach can lead to throwing up. Throwing up happens when food and liquid from your stomach are thrown up and out the mouth. Throwing up can make you feel weak and cause you to get dehydrated. Dehydration can make you tired and thirsty, make you have a dry mouth, and make it so you pee (urinate) less often. Older adults and people with other diseases or a weak defense system (immune system) are at higher risk for dehydration. If you feel sick to your stomach or if you throw up, it is important to follow instructions from your doctor about how to take care of yourself. Follow these instructions at home: Eating and drinking  Follow these instructions as told by your doctor:  Take an oral rehydration solution (ORS). This is a drink that is sold at pharmacies and stores.  Drink clear fluids in small amounts as you are able, such as:  Water.  Ice chips.  Diluted fruit juice.  Low-calorie sports drinks.  Eat bland, easy-to-digest foods in small amounts as you are able, such as:  Bananas.  Applesauce.  Rice.  Low-fat (lean) meats.  Toast.  Crackers.  Avoid fluids that have a lot of sugar or caffeine in them.  Avoid alcohol.  Avoid spicy or fatty foods. General instructions   Drink enough fluid to keep your pee (urine) clear or pale yellow.  Wash your hands often. If you cannot use soap and water, use hand sanitizer.  Make sure that all people in your home wash their hands well and often.  Take over-the-counter and prescription medicines only as told by your doctor.  Rest at home while you get better.  Watch your condition for any changes.  Breathe slowly and deeply when you feel sick to your stomach.  Keep all follow-up visits as told by your doctor. This is important. Contact a doctor  if:  You have a fever.  You cannot keep fluids down.  Your symptoms get worse.  You have new symptoms.  You feel sick to your stomach for more than two days.  You feel light-headed or dizzy.  You have a headache.  You have muscle cramps. Get help right away if:  You have pain in your chest, neck, arm, or jaw.  You feel very weak or you pass out (faint).  You throw up again and again.  You see blood in your throw-up.  Your throw-up looks like black coffee grounds.  You have bloody or black poop (stools) or poop that look like tar.  You have a very bad headache, a stiff neck, or both.  You have a rash.  You have very bad pain, cramping, or bloating in your belly (abdomen).  You have trouble breathing.  You are breathing very quickly.  Your heart is beating very quickly.  Your skin feels cold and clammy.  You feel confused.  You have pain when you pee.  You have signs of dehydration, such as:  Dark pee, hardly any pee, or no pee.  Cracked lips.  Dry mouth.  Sunken eyes.  Sleepiness.  Weakness. These symptoms may be an emergency. Do not wait to see if the symptoms will go away. Get medical help right away. Call your local emergency services (911 in the [place] Do not drive yourself to the hospital. This information   is not intended to replace advice given to you by your health care provider. Make sure you discuss any questions you have with your health care provider. Document Released: 07/18/2007 Document Revised: 08/19/2015 Document Reviewed: 10/05/2014 Elsevier Interactive Patient Education  2017 [employer]. Acute Urinary Retention, Female Acute urinary retention is the temporary inability to urinate. This is an uncommon problem in women. It can be caused by:  Infection.  A side effect of a medicine.  A problem in a nearby organ that presses or squeezes on the bladder or the urethra (the tube that drains the bladder).  Psychological  problems.  Surgery on your bladder, urethra, or pelvic organs that causes obstruction to the outflow of urine from your bladder. Follow these instructions at home: If you are sent home with a Foley catheter and a drainage system, you will need to discuss the best course of action with your health care provider. While the catheter is in, maintain a good intake of fluids. Keep the drainage bag emptied and lower than your catheter. This is so that contaminated urine will not flow back into your bladder, which could lead to a urinary tract infection. There are two main types of drainage bags. One is a large bag that usually is used at night. It has a good capacity that will allow you to sleep through the night without having to empty it. The second type is called a leg bag. It has a smaller capacity so it needs to be emptied more frequently. However, the main advantage is that it can be attached by a leg strap and goes underneath your clothing, allowing you the freedom to move about or leave your home. Only take over-the-counter or prescription medicines for pain, discomfort, or fever as directed by your health care provider. Contact a health care provider if:  You develop a low-grade fever.  You experience spasms or leakage of urine with the spasms. Get help right away if:  You develop chills or fever.  Your catheter stops draining urine.  Your catheter falls out.  You start to develop increased bleeding that does not respond to rest and increased fluid intake. This information is not intended to replace advice given to you by your health care provider. Make sure you discuss any questions you have with your health care provider. Document Released: 01/28/2006 Document Revised: 07/13/2015 Document Reviewed: 07/10/2012 Elsevier Interactive Patient Education  2017 [employer].

## 2016-05-24 LAB — HIV ANTIBODY (ROUTINE TESTING W REFLEX): HIV Screen 4th Generation wRfx: NONREACTIVE

## 2016-05-24 LAB — GC/CHLAMYDIA PROBE AMP (~~LOC~~) NOT AT ARMC
CHLAMYDIA, DNA PROBE: NEGATIVE
Neisseria Gonorrhea: NEGATIVE

## 2016-05-25 ENCOUNTER — Observation Stay (HOSPITAL_COMMUNITY)
Admission: AD | Admit: 2016-05-25 | Discharge: 2016-05-27 | Disposition: A | Discharge status: Home / Self Care | Payer: Medicaid Other | Source: Ambulatory Visit | Attending: Obstetrics and Gynecology | Admitting: Obstetrics and Gynecology

## 2016-05-25 ENCOUNTER — Encounter (HOSPITAL_COMMUNITY): Payer: Self-pay

## 2016-05-25 DIAGNOSIS — Z3A01 Less than 8 weeks gestation of pregnancy: Secondary | ICD-10-CM | POA: Insufficient documentation

## 2016-05-25 DIAGNOSIS — O211 Hyperemesis gravidarum with metabolic disturbance: Secondary | ICD-10-CM | POA: Diagnosis not present

## 2016-05-25 DIAGNOSIS — K117 Disturbances of salivary secretion: Secondary | ICD-10-CM | POA: Diagnosis present

## 2016-05-25 DIAGNOSIS — N319 Neuromuscular dysfunction of bladder, unspecified: Secondary | ICD-10-CM

## 2016-05-25 DIAGNOSIS — O219 Vomiting of pregnancy, unspecified: Secondary | ICD-10-CM | POA: Diagnosis present

## 2016-05-25 DIAGNOSIS — O2341 Unspecified infection of urinary tract in pregnancy, first trimester: Principal | ICD-10-CM | POA: Insufficient documentation

## 2016-05-25 DIAGNOSIS — N390 Urinary tract infection, site not specified: Secondary | ICD-10-CM | POA: Diagnosis present

## 2016-05-25 LAB — URINALYSIS, ROUTINE W REFLEX MICROSCOPIC
BILIRUBIN URINE: NEGATIVE
Glucose, UA: NEGATIVE mg/dL
HGB URINE DIPSTICK: NEGATIVE
KETONES UR: 20 mg/dL — AB
Nitrite: NEGATIVE
PH: 7 (ref 5.0–8.0)
Protein, ur: NEGATIVE mg/dL
Specific Gravity, Urine: 1.02 (ref 1.005–1.030)

## 2016-05-25 LAB — CBC
HCT: 31.7 % — ABNORMAL LOW (ref 36.0–46.0)
Hemoglobin: 10.8 g/dL — ABNORMAL LOW (ref 12.0–15.0)
MCH: 21.3 pg — AB (ref 26.0–34.0)
MCHC: 34.1 g/dL (ref 30.0–36.0)
MCV: 62.6 fL — AB (ref 78.0–100.0)
Platelets: 280 10*3/uL (ref 150–400)
RBC: 5.06 MIL/uL (ref 3.87–5.11)
RDW: 20.5 % — ABNORMAL HIGH (ref 11.5–15.5)
WBC: 5.7 10*3/uL (ref 4.0–10.5)

## 2016-05-25 LAB — BASIC METABOLIC PANEL
Anion gap: 9 (ref 5–15)
BUN: 7 mg/dL (ref 6–20)
CALCIUM: 9.1 mg/dL (ref 8.9–10.3)
CO2: 21 mmol/L — ABNORMAL LOW (ref 22–32)
Chloride: 103 mmol/L (ref 101–111)
Creatinine, Ser: 0.45 mg/dL (ref 0.44–1.00)
GFR calc Af Amer: >60 mL/min (ref >60)
GLUCOSE: 93 mg/dL (ref 65–99)
Potassium: 3.7 mmol/L (ref 3.5–5.1)
SODIUM: 133 mmol/L — AB (ref 135–145)

## 2016-05-25 MED ORDER — LACTATED RINGERS IV SOLN
INTRAVENOUS | Status: DC
Start: 2016-05-25 — End: 2016-05-27
  Administered 2016-05-25: 125 mL/h via INTRAVENOUS
  Administered 2016-05-25: 19:00:00 via INTRAVENOUS
  Administered 2016-05-26: 125 mL/h via INTRAVENOUS
  Administered 2016-05-26: 16:00:00 via INTRAVENOUS

## 2016-05-25 MED ORDER — PRENATAL MULTIVITAMIN CH
1.0000 | ORAL_TABLET | Freq: Every day | ORAL | Status: DC
  Administered 2016-05-26: 1 via ORAL
  Filled 2016-05-25: qty 1

## 2016-05-25 MED ORDER — GLYCOPYRROLATE 1 MG PO TABS
2.0000 mg | ORAL_TABLET | Freq: Three times a day (TID) | ORAL | Status: DC | PRN
  Filled 2016-05-25: qty 2

## 2016-05-25 MED ORDER — METOCLOPRAMIDE HCL 5 MG/ML IJ SOLN
10.0000 mg | Freq: Four times a day (QID) | INTRAMUSCULAR | Status: DC | PRN
  Administered 2016-05-26: 10 mg via INTRAVENOUS
  Filled 2016-05-25: qty 2

## 2016-05-25 MED ORDER — PROMETHAZINE HCL 25 MG RE SUPP
25.0000 mg | Freq: Four times a day (QID) | RECTAL | Status: DC | PRN
  Filled 2016-05-25: qty 1

## 2016-05-25 MED ORDER — M.V.I. ADULT IV INJ: INTRAVENOUS | Status: DC

## 2016-05-25 MED ORDER — CLINDAMYCIN PHOSPHATE 900 MG/50ML IV SOLN
900.0000 mg | Freq: Three times a day (TID) | INTRAVENOUS | Status: DC
  Administered 2016-05-25 – 2016-05-26 (×2): 900 mg via INTRAVENOUS
  Filled 2016-05-25 (×3): qty 50

## 2016-05-25 MED ORDER — FAMOTIDINE IN NACL 20-0.9 MG/50ML-% IV SOLN
20.0000 mg | Freq: Two times a day (BID) | INTRAVENOUS | Status: DC
Start: 2016-05-25 — End: 2016-05-27
  Administered 2016-05-25 – 2016-05-26 (×3): 20 mg via INTRAVENOUS
  Filled 2016-05-25 (×4): qty 50

## 2016-05-25 NOTE — MAU Provider Note (Signed)
History     CSN: [id number]  Arrival date and time: 05/25/16 1705   First Provider Initiated Contact with Patient 05/25/16 1738      Chief Complaint  Patient presents with  . Emesis   HPI Hayley Calhoun is a 31 y.o. [id number] at [redacted]w[redacted]d who presents with nausea & vomiting. Patient has history of frequent UTI r/t self catheterization for neurogenic bladder. Most recent urine culture showed >100k staph (coag negative) on 4/7; pt was rx keflex at that time but return to [facility] on 4/11 for abdominal cramping. On 4/11 she was evaluated & per pharmacy consult it was decided to start her on clindamycin with instruction to return if unable to tolerate abx as she has been suffering from pregnancy related n/v.  Patient presents today stating she has been unable to keep down any of the antibiotics thus far. Has reportedly vomited "countless" times today even with the antiemetics recently prescribed to her.  Denies hematuria, dysuria, flank pain, or fever.   OB History    Gravida Para Term Preterm AB Living   10 3 3  0 6 3   SAB TAB Ectopic Multiple Live Births   6 0 0 0 3      Past Medical History:  Diagnosis Date  . Bladder disorder   . Kidney stones   . Pregnant   . Spina bifida   . UTI (lower urinary tract infection)     Past Surgical History:  Procedure Laterality Date  . bladder botox    . INTERSTIM IMPLANT PLACEMENT    . LITHOTRIPSY    . spina bifida    . WISDOM TOOTH EXTRACTION      Family History  Problem Relation Age of Onset  . Scoliosis Mother   . Other Neg Hx     Social History  Substance Use Topics  . Smoking status: Never Smoker  . Smokeless tobacco: Never Used  . Alcohol use No    Allergies:  Allergies  Allergen Reactions  . Sulfa Antibiotics Shortness Of Breath and Itching  . Keflex [Cephalexin] Nausea And Vomiting  . Nitrofurantoin Monohyd Macro Nausea And Vomiting  . Penicillins Other (See Comments)    Reaction:  Unknown  Has patient had a PCN  reaction causing immediate rash, facial/tongue/throat swelling, SOB or lightheadedness with hypotension: Unsure Has patient had a PCN reaction causing severe rash involving mucus membranes or skin necrosis: Unsure Has patient had a PCN reaction that required hospitalization Unsure Has patient had a PCN reaction occurring within the last 10 years: No If all of the above answers are "NO", then may proceed with Cephalosporin use. Childhood reaction unknown    Prescriptions Prior to Admission  Medication Sig Dispense Refill Last Dose  . clindamycin (CLEOCIN) 300 MG capsule Take 1 capsule (300 mg total) by mouth 4 (four) times daily. 28 capsule 0   . metoCLOPramide (REGLAN) 10 MG tablet Take 1 tablet (10 mg total) by mouth 4 (four) times daily -  before meals and at bedtime. 60 tablet 1   . promethazine (PHENERGAN) 12.5 MG tablet Take 1 tablet (12.5 mg total) by mouth every 6 (six) hours as needed for nausea or vomiting. (Patient not taking: Reported on 05/23/2016) 30 tablet 0 Not Taking at Unknown time  . ranitidine (ZANTAC) 150 MG tablet Take 1 tablet (150 mg total) by mouth 2 (two) times daily. 60 tablet 1     Review of Systems  Constitutional: Negative for chills and fever.  Gastrointestinal: Positive for  nausea and vomiting. Negative for abdominal pain, constipation and diarrhea.  Genitourinary: Negative for dysuria, flank pain, hematuria and vaginal bleeding.   Physical Exam   Blood pressure 127/79, pulse 87, temperature 99.3 F (37.4 C), temperature source Oral, resp. rate 16, last menstrual period 04/03/2016.  Physical Exam  Nursing note and vitals reviewed. Constitutional: She is oriented to person, place, and time. She appears well-developed and well-nourished. No distress.  HENT:  Head: Normocephalic and atraumatic.  Eyes: Conjunctivae are normal. Right eye exhibits no discharge. Left eye exhibits no discharge. No scleral icterus.  Neck: Normal range of motion.  Respiratory:  Effort normal and breath sounds normal. No respiratory distress.  GI: Soft. There is no CVA tenderness.  Neurological: She is alert and oriented to person, place, and time.  Skin: Skin is warm and dry. She is not diaphoretic.  Psychiatric: She has a normal mood and affect. Her behavior is normal. Judgment and thought content normal.    MAU Course  Procedures No results found for this or any previous visit (from the past 48 hour(s)).  MDM Temp 99.3 S/w Dr. Glo Herring regarding patient. Will keep for IV antibiotics  Assessment and Plan  A: 1. Urinary tract infection in mother during first trimester of pregnancy   2. Neurogenic bladder   3. Nausea and vomiting in pregnancy prior to [redacted] weeks gestation   4. Ptyalism    P: Dr. Glo Herring at bedside to discuss plan of care with patient  Jorje Guild 05/25/2016, 5:37 PM

## 2016-05-25 NOTE — Progress Notes (Addendum)
G10P3 @ [redacted] wksga. Presents to triage for vomtting due to abx dose x4/day for UTI. Pt here two days ago for symptoms of UTI. Provider placed pt on abx and pt having nausea and vomiting.   1855: MD at bs. Assessing pt. Pt will be on overnight observation to 3rd level. Ordered for IV.   1912: labs drawn and IV started with LR infusing  1935: [facility] notified. Stated will call back to get report on pt.   1950: Report given to RN [place].   1955: Pt transferred via wheelchair to 3rd floor.

## 2016-05-25 NOTE — H&P (Signed)
Admitted for iv hydration, admin of IV antibiotics due to N& V precluding keeping clindamycin down orally, with hx of tethered cord, neurogenic bladder requiring self cathing since her last child. Pt is considering option of termination of pregnancy due to multiple health concerns as well as responsibilities of 3 other children , school, 2 jobs, . Has supportive family and FOB.   Expand All Collapse All   [] Hide copied text [] Hover for attribution information  History   CSN: [id number]  Arrival date and time: 05/25/16 1705   First Provider Initiated Contact with Patient 05/25/16 1738         Chief Complaint  Patient presents with  . Emesis   HPI Hayley Calhoun is a 31 y.o. [id number] at [redacted]w[redacted]d who presents with nausea & vomiting. Patient has history of frequent UTI r/t self catheterization for neurogenic bladder. Most recent urine culture showed >100k staph (coag negative) on 4/7; pt was rx keflex at that time but return to [facility] on 4/11 for abdominal cramping. On 4/11 she was evaluated & per pharmacy consult it was decided to start her on clindamycin with instruction to return if unable to tolerate abx as she has been suffering from pregnancy related n/v.  Patient presents today stating she has been unable to keep down any of the antibiotics thus far. Has reportedly vomited "countless" times today even with the antiemetics recently prescribed to her.  Denies hematuria, dysuria, flank pain, or fever.           OB History    Gravida Para Term Preterm AB Living   10 3 3  0 6 3   SAB TAB Ectopic Multiple Live Births   6 0 0 0 3          Past Medical History:  Diagnosis Date  . Bladder disorder   . Kidney stones   . Pregnant   . Spina bifida   . UTI (lower urinary tract infection)          Past Surgical History:  Procedure Laterality Date  . bladder botox    . INTERSTIM IMPLANT PLACEMENT    . LITHOTRIPSY    . spina bifida    . WISDOM TOOTH  EXTRACTION           Family History  Problem Relation Age of Onset  . Scoliosis Mother   . Other Neg Hx         Social History  Substance Use Topics  . Smoking status: Never Smoker  . Smokeless tobacco: Never Used  . Alcohol use No    Allergies:       Allergies  Allergen Reactions  . Sulfa Antibiotics Shortness Of Breath and Itching  . Keflex [Cephalexin] Nausea And Vomiting  . Nitrofurantoin Monohyd Macro Nausea And Vomiting  . Penicillins Other (See Comments)    Reaction:  Unknown  Has patient had a PCN reaction causing immediate rash, facial/tongue/throat swelling, SOB or lightheadedness with hypotension: Unsure Has patient had a PCN reaction causing severe rash involving mucus membranes or skin necrosis: Unsure Has patient had a PCN reaction that required hospitalization Unsure Has patient had a PCN reaction occurring within the last 10 years: No If all of the above answers are "NO", then may proceed with Cephalosporin use. Childhood reaction unknown           Prescriptions Prior to Admission  Medication Sig Dispense Refill Last Dose  . clindamycin (CLEOCIN) 300 MG capsule Take 1 capsule (300 mg total) by mouth  4 (four) times daily. 28 capsule 0   . metoCLOPramide (REGLAN) 10 MG tablet Take 1 tablet (10 mg total) by mouth 4 (four) times daily -  before meals and at bedtime. 60 tablet 1   . promethazine (PHENERGAN) 12.5 MG tablet Take 1 tablet (12.5 mg total) by mouth every 6 (six) hours as needed for nausea or vomiting. (Patient not taking: Reported on 05/23/2016) 30 tablet 0 Not Taking at Unknown time  . ranitidine (ZANTAC) 150 MG tablet Take 1 tablet (150 mg total) by mouth 2 (two) times daily. 60 tablet 1     Review of Systems  Constitutional: Negative for chills and fever.  Gastrointestinal: Positive for nausea and vomiting. Negative for abdominal pain, constipation and diarrhea.  Genitourinary: Negative for dysuria, flank pain, hematuria and  vaginal bleeding.   Physical Exam   Blood pressure 127/79, pulse 87, temperature 99.3 F (37.4 C), temperature source Oral, resp. rate 16, last menstrual period 04/03/2016.  Physical Exam  Nursing note and vitals reviewed. Constitutional: She is oriented to person, place, and time. She appears well-developed and well-nourished. No distress.  HENT:  Head: Normocephalic and atraumatic.  Eyes: Conjunctivae are normal. Right eye exhibits no discharge. Left eye exhibits no discharge. No scleral icterus.  Neck: Normal range of motion.  Respiratory: Effort normal and breath sounds normal. No respiratory distress.  GI: Soft. There is no CVA tenderness.  Neurological: She is alert and oriented to person, place, and time.  Skin: Skin is warm and dry. She is not diaphoretic.  Psychiatric: She has a normal mood and affect. Her behavior is normal. Judgment and thought content normal.    MAU Course  Procedures Lab Results Last 48 Hours  No results found for this or any previous visit (from the past 48 hour(s)).    MDM Temp 99.3 S/w Dr. Glo Herring regarding patient. Will keep for IV antibiotics  Assessment and Plan  A: 1. Urinary tract infection in mother during first trimester of pregnancy   2. Neurogenic bladder   3. Nausea and vomiting in pregnancy prior to [redacted] weeks gestation   4. Ptyalism    P: Dr. Glo Herring at bedside to discuss plan of care with patient  Jorje Guild 05/25/2016, 5:37 PM    Admitted for iv hydration, admin of IV antibiotics due to N& V precluding keeping clindamycin down orally, with hx of tethered cord, neurogenic bladder requiring self cathing since her last child. Pt is considering option of termination of pregnancy due to multiple health concerns as well as responsibilities of 3 other children , school, 2 jobs, . Has supportive family and FOB. Will give Gentamycin x 1 dose at pharmacy dosing .  Should be able to go home on Clindamycin po once N& V  addressed.

## 2016-05-26 DIAGNOSIS — N390 Urinary tract infection, site not specified: Secondary | ICD-10-CM | POA: Diagnosis present

## 2016-05-26 MED ORDER — CLINDAMYCIN HCL 300 MG PO CAPS
300.0000 mg | ORAL_CAPSULE | Freq: Four times a day (QID) | ORAL | Status: DC
  Administered 2016-05-26 – 2016-05-27 (×4): 300 mg via ORAL
  Filled 2016-05-26 (×5): qty 1

## 2016-05-26 MED ORDER — PROMETHAZINE HCL 25 MG PO TABS
25.0000 mg | ORAL_TABLET | Freq: Four times a day (QID) | ORAL | Status: DC | PRN
  Administered 2016-05-26: 25 mg via ORAL
  Filled 2016-05-26: qty 1

## 2016-05-26 MED ORDER — METOCLOPRAMIDE HCL 10 MG PO TABS
10.0000 mg | ORAL_TABLET | Freq: Four times a day (QID) | ORAL | Status: DC | PRN
Start: 2016-05-26 — End: 2016-05-27

## 2016-05-26 MED ORDER — DEXTROSE 5 % IV SOLN
300.0000 mg | Freq: Once | INTRAVENOUS | Status: AC
  Administered 2016-05-26: 300 mg via INTRAVENOUS
  Filled 2016-05-26: qty 7.5

## 2016-05-26 NOTE — Progress Notes (Signed)
Pt tolerated breakfast. Pt eating lunch at this time. No emesis noted. Toya Smothers, RN

## 2016-05-26 NOTE — Progress Notes (Signed)
ANTIBIOTIC CONSULT NOTE - INITIAL  Pharmacy Consult for Gentamicin Indication: recurrent UTI  Allergies  Allergen Reactions  . Sulfa Antibiotics Shortness Of Breath and Itching  . Keflex [Cephalexin] Nausea And Vomiting  . Nitrofurantoin Monohyd Macro Nausea And Vomiting  . Penicillins Other (See Comments)    Reaction:  Unknown  Has patient had a PCN reaction causing immediate rash, facial/tongue/throat swelling, SOB or lightheadedness with hypotension: Unsure Has patient had a PCN reaction causing severe rash involving mucus membranes or skin necrosis: Unsure Has patient had a PCN reaction that required hospitalization Unsure Has patient had a PCN reaction occurring within the last 10 years: No If all of the above answers are "NO", then may proceed with Cephalosporin use. Childhood reaction unknown    Patient Measurements: Height: 5\' 4"  (162.6 cm) Weight: 136 lb (61.7 kg) IBW/kg (Calculated) : 54.7 Adjusted Body Weight:   Vital Signs: Temp: 98.3 F (36.8 C) (04/13 2335) Temp Source: Oral (04/13 2335) BP: 116/77 (04/13 2335) Pulse Rate: 78 (04/13 2335)  Labs:  Recent Labs  05/23/16 1219 05/25/16 1912  WBC 4.7 5.7  HGB 11.0* 10.8*  PLT 288 280  CREATININE  --  0.45   No results for input(s): GENTTROUGH, GENTPEAK, GENTRANDOM in the last 72 hours.   Microbiology: Recent Results (from the past 720 hour(s))  Culture, OB Urine     Status: Abnormal   Collection Time: 05/19/16 12:20 PM  Result Value Ref Range Status   Specimen Description OB CLEAN CATCH  Final   Special Requests NONE  Final   Culture (A)  Final    >=100,000 COLONIES/mL STAPHYLOCOCCUS SPECIES (COAGULASE NEGATIVE)   Report Status 05/21/2016 FINAL  Final   Organism ID, Bacteria STAPHYLOCOCCUS SPECIES (COAGULASE NEGATIVE) (A)  Final      Susceptibility   Staphylococcus species (coagulase negative) - MIC*    CIPROFLOXACIN 4 RESISTANT Resistant     ERYTHROMYCIN <=0.25 SENSITIVE Sensitive     GENTAMICIN  <=0.5 SENSITIVE Sensitive     OXACILLIN >=4 RESISTANT Resistant     TETRACYCLINE <=1 SENSITIVE Sensitive     VANCOMYCIN 2 SENSITIVE Sensitive     TRIMETH/SULFA 40 SENSITIVE Sensitive     CLINDAMYCIN <=0.25 SENSITIVE Sensitive     RIFAMPIN <=0.5 SENSITIVE Sensitive     Inducible Clindamycin NEGATIVE Sensitive     * >=100,000 COLONIES/mL STAPHYLOCOCCUS SPECIES (COAGULASE NEGATIVE)  Wet prep, genital     Status: Abnormal   Collection Time: 05/23/16 11:48 AM  Result Value Ref Range Status   Yeast Wet Prep HPF POC NONE SEEN NONE SEEN Final   Trich, Wet Prep NONE SEEN NONE SEEN Final   Clue Cells Wet Prep HPF POC NONE SEEN NONE SEEN Final   WBC, Wet Prep HPF POC FEW (A) NONE SEEN Final   Sperm NONE SEEN  Final    Medications:  Clindamycin 900mg  Q8 hours  Assessment: 31 y.o. female [id number] at [redacted]w[redacted]d admitted for N/V keeping her from taking PO clindamycin for recurrent UTI.  Pt self caths due to neurogenic bladder.  4/7 UCx >100000 coagulase negative staph-oxacillin resistant. Clindamycin Sensitive and inducible clindamycin negative.  Given Pt has had multiple UTIs with Ecoli in the past, will give 1 dose of gentamicin while inpatient. SCr=0.45. Expect excellent clearance.    Plan:  Gentamicin 300 mg (5mg /kg) IV x 1   Thank you,   Hayley Calhoun 05/26/2016,2:31 AM

## 2016-05-26 NOTE — Progress Notes (Signed)
FACULTY PRACTICE ANTEPARTUM(COMPREHENSIVE) NOTE  Hayley Calhoun is a 31 y.o. [id number] at [redacted]w[redacted]d  who is admitted for dehydration, complicated uti in pt with neurogenic bladder with tethered spinal cord.   Fetal presentation is n/a. Length of Stay:  1  Days  Subjective: Keeping po liquids down, robinul has stopped the spitting. On reglan   Vitals:  Blood pressure 114/72, pulse 75, temperature 98.9 F (37.2 C), temperature source Oral, resp. rate 16, height 5\' 4"  (1.626 m), weight 62.6 kg (138 lb), last menstrual period 04/03/2016, SpO2 100 %. Physical Examination:  General appearance - alert, well appearing, and in no distress, oriented to person, place, and time and normal appearing weight Heart - normal rate and regular rhythm Abdomen - soft, nontender, nondistended Fundal Height:   Cervical Exam:  and found to be / / and fetal presentation is . Extremities: extremities normal, atraumatic, no cyanosis or edema and Homans sign is negative, no sign of DVT with DTRs 2+ bilaterally Membranes:intact  Fetal Monitoring:  Too early  Labs:  Results for orders placed or performed during the hospital encounter of 05/25/16 (from the past 24 hour(s))  Basic metabolic panel   Collection Time: 05/25/16  7:12 PM  Result Value Ref Range   Sodium 133 (L) 135 - 145 mmol/L   Potassium 3.7 3.5 - 5.1 mmol/L   Chloride 103 101 - 111 mmol/L   CO2 21 (L) 22 - 32 mmol/L   Glucose, Bld 93 65 - 99 mg/dL   BUN 7 6 - 20 mg/dL   Creatinine, Ser 0.45 0.44 - 1.00 mg/dL   Calcium 9.1 8.9 - 10.3 mg/dL   GFR calc non Af Amer >60 >60 mL/min   GFR calc Af Amer >60 >60 mL/min   Anion gap 9 5 - 15  CBC   Collection Time: 05/25/16  7:12 PM  Result Value Ref Range   WBC 5.7 4.0 - 10.5 K/uL   RBC 5.06 3.87 - 5.11 MIL/uL   Hemoglobin 10.8 (L) 12.0 - 15.0 g/dL   HCT 31.7 (L) 36.0 - 46.0 %   MCV 62.6 (L) 78.0 - 100.0 fL   MCH 21.3 (L) 26.0 - 34.0 pg   MCHC 34.1 30.0 - 36.0 g/dL   RDW 20.5 (H) 11.5 - 15.5 %    Platelets 280 150 - 400 K/uL  Urinalysis, Routine w reflex microscopic   Collection Time: 05/25/16  8:00 PM  Result Value Ref Range   Color, Urine YELLOW YELLOW   APPearance HAZY (A) CLEAR   Specific Gravity, Urine 1.020 1.005 - 1.030   pH 7.0 5.0 - 8.0   Glucose, UA NEGATIVE NEGATIVE mg/dL   Hgb urine dipstick NEGATIVE NEGATIVE   Bilirubin Urine NEGATIVE NEGATIVE   Ketones, ur 20 (A) NEGATIVE mg/dL   Protein, ur NEGATIVE NEGATIVE mg/dL   Nitrite NEGATIVE NEGATIVE   Leukocytes, UA SMALL (A) NEGATIVE   RBC / HPF 0-5 0 - 5 RBC/hpf   WBC, UA TOO NUMEROUS TO COUNT 0 - 5 WBC/hpf   Bacteria, UA RARE (A) NONE SEEN   Squamous Epithelial / LPF 0-5 (A) NONE SEEN   Mucous PRESENT     Imaging Studies:     Currently EPIC will not allow sonographic studies to automatically populate into notes.  In the meantime, copy and paste results into note or free text.  Medications:  Scheduled . clindamycin (CLEOCIN) IV  900 mg Intravenous Q8H  . famotidine (PEPCID) IV  20 mg Intravenous Q12H  . prenatal  multivitamin  1 tablet Oral Q1200   I have reviewed the patient's current medications.  ASSESSMENT: Patient Active Problem List   Diagnosis Date Noted  . Complicated UTI (urinary tract infection) 05/26/2016  . Urinary tract infection in mother during first trimester of pregnancy 05/25/2016  . Nausea and vomiting in pregnancy prior to [redacted] weeks gestation 05/25/2016  . Ptyalism 05/25/2016  . Hyperemesis gravidarum before end of [redacted] week gestation with dehydration 05/25/2016  . Frequent UTI 07/09/2013  . Spina bifida ([place]) 06/06/2012  . 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 Chronic constipation 05/01/2012  . URINARY RETENTION 02/10/2009    PLAN: Advance diet to bland diet, if she tolerates it this am, would be candidate for later today d/c on reglan, robinul and phenergan suppositories, and to resume po clindamycin for Staph uti  Jonnie Kind 05/26/2016,7:52 AM    Patient ID:  Hayley Calhoun, female   DOB: 1985/05/28, 31 y.o.   MRN: [id number]

## 2016-05-26 NOTE — Progress Notes (Signed)
Patient ID: Hayley Calhoun, female   DOB: 1985-05-01, 31 y.o.   MRN: [id number]  S: checked on patient to assess toleration of PO intake. She has been able to eat breakfast and lunch without emesis, has some nausea after breakfast but subsided after antiemetic. Lunch had a [place] sandwich and tolerated fine. Has been switched to PO antibiotic, has taken one dose so far, overdue for next dose.    O:  Vitals:   05/26/16 1550 05/26/16 2004  BP: 132/72 124/79  Pulse: 85 63  Resp: 16 16  Temp: 98.7 F (37.1 C) 98.6 F (37 C)    GEN: Looks well, NAD CV: regular rate RESP: normal effort ABD: soft, NT EXTR: no edema  A/P: Switching all meds to PO (including antiemetics) Saline lock IV If tolerates PO, possible D/C tomorrow

## 2016-05-27 DIAGNOSIS — N390 Urinary tract infection, site not specified: Secondary | ICD-10-CM | POA: Diagnosis not present

## 2016-05-27 DIAGNOSIS — O211 Hyperemesis gravidarum with metabolic disturbance: Secondary | ICD-10-CM | POA: Diagnosis not present

## 2016-05-27 MED ORDER — FAMOTIDINE 20 MG PO TABS: 20.0000 mg | ORAL_TABLET | Freq: Two times a day (BID) | ORAL | Status: DC

## 2016-05-27 MED ORDER — PROMETHAZINE HCL 25 MG PO TABS: 25.0000 mg | ORAL_TABLET | Freq: Four times a day (QID) | ORAL | 4 refills | Status: DC | PRN

## 2016-05-27 MED ORDER — GLYCOPYRROLATE 1 MG PO TABS: 2.0000 mg | ORAL_TABLET | Freq: Three times a day (TID) | ORAL | 2 refills | Status: DC | PRN

## 2016-05-27 NOTE — Discharge Summary (Addendum)
Physician Discharge Summary  Patient ID: Hayley Calhoun MRN: [id number] DOB/AGE: 11-Sep-1985 31 y.o.  Admit date: 05/25/2016 Discharge date: 05/27/2016   Discharge Diagnoses:  Active Problems:   Urinary tract infection in mother during first trimester of pregnancy   Nausea and vomiting in pregnancy prior to [redacted] weeks gestation   Ptyalism   Hyperemesis gravidarum before end of [redacted] week gestation with dehydration   Complicated UTI (urinary tract infection)   Consults: None  Significant Diagnostic Studies: labs:  CBC    Component Value Date/Time   WBC 5.7 05/25/2016 1912   RBC 5.06 05/25/2016 1912   HGB 10.8 (L) 05/25/2016 1912   HCT 31.7 (L) 05/25/2016 1912   PLT 280 05/25/2016 1912   MCV 62.6 (L) 05/25/2016 1912   MCH 21.3 (L) 05/25/2016 1912   MCHC 34.1 05/25/2016 1912   RDW 20.5 (H) 05/25/2016 1912   LYMPHSABS 0.9 12/18/2012 0948   MONOABS 0.3 12/18/2012 0948   EOSABS 0.1 12/18/2012 0948   BASOSABS 0.0 12/18/2012 0948   Urine Culture coag neg staph  US Ob Comp Less 14 Wks  Result Date: 05/23/2016 CLINICAL DATA:  Pregnant, abdominal cramping x1 day EXAM: OBSTETRIC <14 WK [place] AND TRANSVAGINAL OB US TECHNIQUE: Both transabdominal and transvaginal ultrasound examinations were performed for complete evaluation of the gestation as well as the maternal uterus, adnexal regions, and pelvic cul-de-sac. Transvaginal technique was performed to assess early pregnancy. COMPARISON:  None. FINDINGS: Intrauterine gestational sac: Single Yolk sac:  Visualized. Embryo:  Visualized. Cardiac Activity: Visualized. Heart Rate: 150  bpm CRL:  9.2  mm   6 w   6 d                  [place] EDC: 01/10/2017 Subchorionic hemorrhage:  Small subchronic hemorrhage. Maternal uterus/adnexae: Bilateral ovaries are within normal limits, noting a right corpus luteal cyst. Trace pelvic fluid, likely physiologic. IMPRESSION: Single live intrauterine gestation, with estimated gestational age [redacted] weeks 6 days by crown-rump  length. Electronically Signed   By: Julian Hy M.D.   On: 05/23/2016 13:38   US Ob Transvaginal  Result Date: 05/23/2016 CLINICAL DATA:  Pregnant, abdominal cramping x1 day EXAM: OBSTETRIC <14 WK [place] AND TRANSVAGINAL OB US TECHNIQUE: Both transabdominal and transvaginal ultrasound examinations were performed for complete evaluation of the gestation as well as the maternal uterus, adnexal regions, and pelvic cul-de-sac. Transvaginal technique was performed to assess early pregnancy. COMPARISON:  None. FINDINGS: Intrauterine gestational sac: Single Yolk sac:  Visualized. Embryo:  Visualized. Cardiac Activity: Visualized. Heart Rate: 150  bpm CRL:  9.2  mm   6 w   6 d                  [place] EDC: 01/10/2017 Subchorionic hemorrhage:  Small subchronic hemorrhage. Maternal uterus/adnexae: Bilateral ovaries are within normal limits, noting a right corpus luteal cyst. Trace pelvic fluid, likely physiologic. IMPRESSION: Single live intrauterine gestation, with estimated gestational age [redacted] weeks 6 days by crown-rump length. Electronically Signed   By: Julian Hy M.D.   On: 05/23/2016 13:38     Hospital Course:  Admitted with significant N/V and inability to keep down her Abx. Has neurogenic bladder from tethered cord and self caths. She was admitted, given IV hydration and IV abx. She was transitioned to po and tolerating that well.  Discharge exam: Physical Examination: General appearance - alert, well appearing, and in no distress Chest - normal effort Abdomen - gravid, NT Extremities - Homan's sign negative  bilaterally   Treatments: IV hydration and antibiotics: clindamycin   Disposition: 01-Home or Self Care  Discharged Condition: Improved  Discharge Instructions    Discharge activity:  No Restrictions    Complete by:  As directed    Discharge diet:  No restrictions    Complete by:  As directed    No sexual activity restrictions    Complete by:  As directed      Allergies as of  05/27/2016      Reactions   Sulfa Antibiotics Shortness Of Breath, Itching   Keflex [cephalexin] Nausea And Vomiting   Nitrofurantoin Monohyd Macro Nausea And Vomiting   Penicillins Other (See Comments)   Reaction:  Unknown  Has patient had a PCN reaction causing immediate rash, facial/tongue/throat swelling, SOB or lightheadedness with hypotension: Unsure Has patient had a PCN reaction causing severe rash involving mucus membranes or skin necrosis: Unsure Has patient had a PCN reaction that required hospitalization Unsure Has patient had a PCN reaction occurring within the last 10 years: No If all of the above answers are "NO", then may proceed with Cephalosporin use. Childhood reaction unknown      Medication List    TAKE these medications   clindamycin 300 MG capsule Commonly known as:  CLEOCIN Take 1 capsule (300 mg total) by mouth 4 (four) times daily.   glycopyrrolate 1 MG tablet Commonly known as:  ROBINUL Take 2 tablets (2 mg total) by mouth 3 (three) times daily as needed (spitting).   metoCLOPramide 10 MG tablet Commonly known as:  REGLAN Take 1 tablet (10 mg total) by mouth 4 (four) times daily -  before meals and at bedtime.   promethazine 25 MG tablet Commonly known as:  PHENERGAN Take 1 tablet (25 mg total) by mouth every 6 (six) hours as needed for nausea or vomiting. What changed:  medication strength  how much to take   ranitidine 150 MG tablet Commonly known as:  ZANTAC Take 1 tablet (150 mg total) by mouth 2 (two) times daily.        Signed: Donnamae Jude 05/27/2016, 8:24 AM

## 2016-05-27 NOTE — Discharge Instructions (Signed)
Acute Urinary Retention, Female Urinary retention means you are unable to pee completely or at all (empty your bladder). Follow these instructions at home:  Drink enough fluids to keep your pee (urine) clear or pale yellow.  If you are sent home with a tube that drains the bladder (catheter), there will be a drainage bag attached to it. There are two types of bags. One is big that you can wear at night without having to empty it. One is smaller and needs to be emptied more often.  Keep the drainage bag emptied.  Keep the drainage bag lower than the tube.  Only take medicine as told by your doctor. Contact a doctor if:  You have a low-grade fever.  You have spasms or you are leaking pee when you have spasms. Get help right away if:  You have chills or a fever.  Your catheter stops draining pee.  Your catheter falls out.  You have increased bleeding that does not stop after you have rested and increased the amount of fluids you had been drinking. This information is not intended to replace advice given to you by your health care provider. Make sure you discuss any questions you have with your health care provider. Document Released: 07/18/2007 Document Revised: 07/07/2015 Document Reviewed: 07/10/2012 Elsevier Interactive Patient Education  2017 [employer]  

## 2016-05-27 NOTE — Progress Notes (Signed)
Pt discharged with printed instructions. Pt verbalized an understanding. No concerns noted. Pt waiting for a ride to leave hospital. Toya Smothers, RN

## 2016-06-07 ENCOUNTER — Encounter: Payer: Self-pay | Admitting: Obstetrics and Gynecology

## 2016-06-07 ENCOUNTER — Ambulatory Visit (INDEPENDENT_AMBULATORY_CARE_PROVIDER_SITE_OTHER): Payer: Medicaid Other | Admitting: Obstetrics and Gynecology

## 2016-06-07 ENCOUNTER — Encounter (HOSPITAL_COMMUNITY): Payer: Self-pay

## 2016-06-07 DIAGNOSIS — Z8759 Personal history of other complications of pregnancy, childbirth and the puerperium: Secondary | ICD-10-CM

## 2016-06-07 DIAGNOSIS — O0990 Supervision of high risk pregnancy, unspecified, unspecified trimester: Secondary | ICD-10-CM

## 2016-06-07 DIAGNOSIS — Q057 Lumbar spina bifida without hydrocephalus: Secondary | ICD-10-CM | POA: Diagnosis not present

## 2016-06-07 DIAGNOSIS — Z3009 Encounter for other general counseling and advice on contraception: Secondary | ICD-10-CM

## 2016-06-07 DIAGNOSIS — Z3042 Encounter for surveillance of injectable contraceptive: Secondary | ICD-10-CM

## 2016-06-07 MED ORDER — MEDROXYPROGESTERONE ACETATE 150 MG/ML IM SUSP
150.0000 mg | Freq: Once | INTRAMUSCULAR | Status: AC
  Administered 2016-06-07: 150 mg via INTRAMUSCULAR

## 2016-06-07 NOTE — Progress Notes (Signed)
31 yo [id number] s/p elective abortion on 4/17 presenting here for today to discuss permanent sterilization . Patient reports feeling well. She reports some vaginal bleeding since procedure but lighter than a period. She is still coping with her decision but does not regret it. She has a good support system at home.  Past Medical History:  Diagnosis Date  . Bladder disorder   . Kidney stones   . Pregnant   . Spina bifida   . UTI (lower urinary tract infection)    Past Surgical History:  Procedure Laterality Date  . bladder botox    . INTERSTIM IMPLANT PLACEMENT    . LITHOTRIPSY    . spina bifida    . WISDOM TOOTH EXTRACTION     Family History  Problem Relation Age of Onset  . Scoliosis Mother   . Other Neg Hx    Social History  Substance Use Topics  . Smoking status: Never Smoker  . Smokeless tobacco: Never Used  . Alcohol use No   ROS See pertinent in HPI  GENERAL: Well-developed, well-nourished female in no acute distress.  ABDOMEN: Soft, nontender, nondistended. Marland Kitchen PELVIC: Normal external female genitalia. Vagina is pink and rugated.  Normal discharge. Normal appearing cervix. Uterus is normal in size. No adnexal mass or tenderness. EXTREMITIES: No cyanosis, clubbing, or edema, 2+ distal pulses.  A/P 31 yo P3073 who desires permanent sterilization - Other reversible forms of contraception were discussed with patient; she declines all other modalities.   - Risks of procedure discussed with patient including permanence of method, bleeding, infection, injury to surrounding organs and need for additional procedures including laparotomy, risk of regret.  Failure risk of 0.5-1% with increased risk of ectopic gestation if pregnancy occurs was also discussed with patient.   - patient signed Medicaid form today and will be scheduled for laparoscopic bilateral tubal ligation - Patient plans for depo-provera for contraception. First dose today pending BTL

## 2016-07-17 NOTE — Patient Instructions (Addendum)
Your procedure is scheduled on:  Wednesday, July 25, 2016  Enter through the [employer] [facility] at: 11:30 AM  Pick up the phone at the desk and dial [phone].  Call this number if you have problems the morning of surgery: [phone].  Remember: Do NOT eat food:  After Midnight Tuesday  Do NOT drink clear liquids after:  9:00 AM day of surgery  Take these medicines the morning of surgery with a SIP OF WATER:  None  Stop ALL herbal medications at this time  Do NOT smoke the day of surgery.  Do NOT wear jewelry (body piercing), metal hair clips/bobby pins, make-up, or nail polish. Do NOT wear lotions, powders, or perfumes.  You may wear deodorant. Do NOT shave for 48 hours prior to surgery. Do NOT bring valuables to the hospital. Contacts, dentures, or bridgework may not be worn into surgery.  Have a responsible adult drive you home and stay with you for 24 hours after your procedure  Bring a copy of your healthcare power of attorney and living will documents.

## 2016-07-18 ENCOUNTER — Encounter (HOSPITAL_COMMUNITY)
Admission: RE | Admit: 2016-07-18 | Discharge: 2016-07-18 | Disposition: A | Discharge status: Home / Self Care | Payer: Medicaid Other | Source: Ambulatory Visit | Attending: Obstetrics and Gynecology | Admitting: Obstetrics and Gynecology

## 2016-07-18 ENCOUNTER — Encounter (HOSPITAL_COMMUNITY): Payer: Self-pay

## 2016-07-18 DIAGNOSIS — Z01812 Encounter for preprocedural laboratory examination: Secondary | ICD-10-CM | POA: Diagnosis not present

## 2016-07-18 HISTORY — DX: Other specified health status: Z78.9

## 2016-07-18 HISTORY — DX: Essential (primary) hypertension: I10

## 2016-07-18 HISTORY — DX: Unspecified staphylococcus as the cause of diseases classified elsewhere: B95.8

## 2016-07-18 LAB — CBC
HCT: 30.2 % — ABNORMAL LOW (ref 36.0–46.0)
Hemoglobin: 10.1 g/dL — ABNORMAL LOW (ref 12.0–15.0)
MCH: 21.1 pg — ABNORMAL LOW (ref 26.0–34.0)
MCHC: 33.4 g/dL (ref 30.0–36.0)
MCV: 63 fL — ABNORMAL LOW (ref 78.0–100.0)
Platelets: 275 10*3/uL (ref 150–400)
RBC: 4.79 MIL/uL (ref 3.87–5.11)
RDW: 19.7 % — ABNORMAL HIGH (ref 11.5–15.5)
WBC: 5.3 10*3/uL (ref 4.0–10.5)

## 2016-07-25 ENCOUNTER — Ambulatory Visit (HOSPITAL_COMMUNITY): Payer: Medicaid Other | Admitting: Anesthesiology

## 2016-07-25 ENCOUNTER — Ambulatory Visit (HOSPITAL_COMMUNITY)
Admission: RE | Admit: 2016-07-25 | Discharge: 2016-07-25 | Disposition: A | Discharge status: Home / Self Care | Payer: Medicaid Other | Source: Ambulatory Visit | Attending: Obstetrics & Gynecology | Admitting: Obstetrics & Gynecology

## 2016-07-25 ENCOUNTER — Encounter (HOSPITAL_COMMUNITY)
Admission: RE | Disposition: A | Discharge status: Home / Self Care | Payer: Self-pay | Source: Ambulatory Visit | Attending: Obstetrics & Gynecology

## 2016-07-25 ENCOUNTER — Encounter (HOSPITAL_COMMUNITY): Payer: Self-pay

## 2016-07-25 DIAGNOSIS — Z880 Allergy status to penicillin: Secondary | ICD-10-CM | POA: Insufficient documentation

## 2016-07-25 DIAGNOSIS — Z302 Encounter for sterilization: Secondary | ICD-10-CM | POA: Diagnosis present

## 2016-07-25 DIAGNOSIS — Z87440 Personal history of urinary (tract) infections: Secondary | ICD-10-CM | POA: Diagnosis not present

## 2016-07-25 DIAGNOSIS — Q059 Spina bifida, unspecified: Secondary | ICD-10-CM | POA: Diagnosis not present

## 2016-07-25 HISTORY — PX: LAPAROSCOPIC TUBAL LIGATION: SHX1937

## 2016-07-25 LAB — PREGNANCY, URINE: PREG TEST UR: NEGATIVE

## 2016-07-25 SURGERY — LIGATION, FALLOPIAN TUBE, LAPAROSCOPIC
Anesthesia: General | Laterality: Bilateral

## 2016-07-25 MED ORDER — HYDROMORPHONE HCL 1 MG/ML IJ SOLN
INTRAMUSCULAR | Status: AC
  Filled 2016-07-25: qty 1

## 2016-07-25 MED ORDER — MIDAZOLAM HCL 2 MG/2ML IJ SOLN
INTRAMUSCULAR | Status: AC
  Filled 2016-07-25: qty 2

## 2016-07-25 MED ORDER — PROPOFOL 10 MG/ML IV BOLUS
INTRAVENOUS | Status: DC | PRN
  Administered 2016-07-25: 170 mg via INTRAVENOUS

## 2016-07-25 MED ORDER — METOCLOPRAMIDE HCL 5 MG/ML IJ SOLN
10.0000 mg | Freq: Once | INTRAMUSCULAR | Status: AC
  Administered 2016-07-25: 10 mg via INTRAVENOUS

## 2016-07-25 MED ORDER — LIDOCAINE HCL (CARDIAC) 20 MG/ML IV SOLN
INTRAVENOUS | Status: DC | PRN
  Administered 2016-07-25: 70 mg via INTRAVENOUS
  Administered 2016-07-25: 30 mg via INTRAVENOUS

## 2016-07-25 MED ORDER — DOCUSATE SODIUM 100 MG PO CAPS: 100.0000 mg | ORAL_CAPSULE | Freq: Two times a day (BID) | ORAL | 2 refills | Status: DC | PRN

## 2016-07-25 MED ORDER — SCOPOLAMINE 1 MG/3DAYS TD PT72
MEDICATED_PATCH | TRANSDERMAL | Status: AC
  Administered 2016-07-25: 1.5 mg via TRANSDERMAL
  Filled 2016-07-25: qty 1

## 2016-07-25 MED ORDER — ROCURONIUM BROMIDE 100 MG/10ML IV SOLN
INTRAVENOUS | Status: AC
Start: 2016-07-25 — End: 2016-07-25
  Filled 2016-07-25: qty 1

## 2016-07-25 MED ORDER — PROPOFOL 10 MG/ML IV BOLUS
INTRAVENOUS | Status: AC
  Filled 2016-07-25: qty 20

## 2016-07-25 MED ORDER — HYDROMORPHONE HCL 1 MG/ML IJ SOLN
0.2500 mg | INTRAMUSCULAR | Status: DC | PRN
  Administered 2016-07-25: 0.5 mg via INTRAVENOUS

## 2016-07-25 MED ORDER — OXYCODONE HCL 5 MG PO TABS: 5.0000 mg | ORAL_TABLET | Freq: Once | ORAL | Status: DC | PRN

## 2016-07-25 MED ORDER — BUPIVACAINE HCL (PF) 0.5 % IJ SOLN
INTRAMUSCULAR | Status: DC | PRN
  Administered 2016-07-25: 30 mL

## 2016-07-25 MED ORDER — KETOROLAC TROMETHAMINE 30 MG/ML IJ SOLN
INTRAMUSCULAR | Status: AC
  Filled 2016-07-25: qty 1

## 2016-07-25 MED ORDER — ROCURONIUM BROMIDE 100 MG/10ML IV SOLN
INTRAVENOUS | Status: DC | PRN
  Administered 2016-07-25: 30 mg via INTRAVENOUS

## 2016-07-25 MED ORDER — FENTANYL CITRATE (PF) 100 MCG/2ML IJ SOLN
INTRAMUSCULAR | Status: AC
  Filled 2016-07-25: qty 2

## 2016-07-25 MED ORDER — DEXAMETHASONE SODIUM PHOSPHATE 4 MG/ML IJ SOLN
INTRAMUSCULAR | Status: AC
  Filled 2016-07-25: qty 1

## 2016-07-25 MED ORDER — SUGAMMADEX SODIUM 200 MG/2ML IV SOLN
INTRAVENOUS | Status: AC
  Filled 2016-07-25: qty 2

## 2016-07-25 MED ORDER — PROMETHAZINE HCL 25 MG/ML IJ SOLN: 6.2500 mg | INTRAMUSCULAR | Status: DC | PRN

## 2016-07-25 MED ORDER — BUPIVACAINE HCL (PF) 0.5 % IJ SOLN
INTRAMUSCULAR | Status: AC
  Filled 2016-07-25: qty 30

## 2016-07-25 MED ORDER — METOCLOPRAMIDE HCL 5 MG/ML IJ SOLN
INTRAMUSCULAR | Status: AC
  Administered 2016-07-25: 10 mg via INTRAVENOUS
  Filled 2016-07-25: qty 2

## 2016-07-25 MED ORDER — IBUPROFEN 800 MG PO TABS: 800.0000 mg | ORAL_TABLET | Freq: Three times a day (TID) | ORAL | 2 refills | Status: DC | PRN

## 2016-07-25 MED ORDER — LACTATED RINGERS IV SOLN
INTRAVENOUS | Status: DC
  Administered 2016-07-25: 13:00:00 via INTRAVENOUS

## 2016-07-25 MED ORDER — DEXAMETHASONE SODIUM PHOSPHATE 10 MG/ML IJ SOLN
INTRAMUSCULAR | Status: DC | PRN
  Administered 2016-07-25: 4 mg via INTRAVENOUS

## 2016-07-25 MED ORDER — OXYCODONE-ACETAMINOPHEN 5-325 MG PO TABS: 1.0000 | ORAL_TABLET | Freq: Four times a day (QID) | ORAL | 0 refills | Status: DC | PRN

## 2016-07-25 MED ORDER — ACETAMINOPHEN 500 MG PO TABS
ORAL_TABLET | ORAL | Status: AC
  Administered 2016-07-25: 1000 mg via ORAL
  Filled 2016-07-25: qty 2

## 2016-07-25 MED ORDER — MIDAZOLAM HCL 2 MG/2ML IJ SOLN
INTRAMUSCULAR | Status: DC | PRN
  Administered 2016-07-25: 1 mg via INTRAVENOUS

## 2016-07-25 MED ORDER — OXYCODONE HCL 5 MG/5ML PO SOLN: 5.0000 mg | Freq: Once | ORAL | Status: DC | PRN

## 2016-07-25 MED ORDER — KETOROLAC TROMETHAMINE 30 MG/ML IJ SOLN
INTRAMUSCULAR | Status: DC | PRN
  Administered 2016-07-25: 30 mg via INTRAVENOUS

## 2016-07-25 MED ORDER — SCOPOLAMINE 1 MG/3DAYS TD PT72
1.0000 | MEDICATED_PATCH | Freq: Once | TRANSDERMAL | Status: DC
  Administered 2016-07-25: 1.5 mg via TRANSDERMAL

## 2016-07-25 MED ORDER — FENTANYL CITRATE (PF) 100 MCG/2ML IJ SOLN
INTRAMUSCULAR | Status: DC | PRN
  Administered 2016-07-25 (×2): 50 ug via INTRAVENOUS

## 2016-07-25 MED ORDER — ACETAMINOPHEN 500 MG PO TABS
1000.0000 mg | ORAL_TABLET | Freq: Once | ORAL | Status: AC
  Administered 2016-07-25: 1000 mg via ORAL

## 2016-07-25 MED ORDER — ONDANSETRON HCL 4 MG/2ML IJ SOLN
INTRAMUSCULAR | Status: DC | PRN
  Administered 2016-07-25: 4 mg via INTRAVENOUS

## 2016-07-25 MED ORDER — FLUMAZENIL 0.5 MG/5ML IV SOLN
INTRAVENOUS | Status: DC | PRN
  Administered 2016-07-25: 0.2 mg via INTRAVENOUS

## 2016-07-25 MED ORDER — LIDOCAINE HCL (CARDIAC) 20 MG/ML IV SOLN
INTRAVENOUS | Status: AC
  Filled 2016-07-25: qty 5

## 2016-07-25 MED ORDER — ONDANSETRON HCL 4 MG/2ML IJ SOLN
INTRAMUSCULAR | Status: AC
  Filled 2016-07-25: qty 2

## 2016-07-25 MED ORDER — SUGAMMADEX SODIUM 200 MG/2ML IV SOLN
INTRAVENOUS | Status: DC | PRN
  Administered 2016-07-25: 125.6 mg via INTRAVENOUS

## 2016-07-25 SURGICAL SUPPLY — 24 items
CATH ROBINSON RED A/P 16FR (CATHETERS) ×3 IMPLANT
CLIP FILSHIE TUBAL LIGA STRL (Clip) ×3 IMPLANT
CLOTH BEACON ORANGE TIMEOUT ST (SAFETY) ×3 IMPLANT
DERMABOND ADVANCED (GAUZE/BANDAGES/DRESSINGS) ×2
DERMABOND ADVANCED .7 DNX12 (GAUZE/BANDAGES/DRESSINGS) ×1 IMPLANT
DRESSING OPSITE X SMALL 2X3 (GAUZE/BANDAGES/DRESSINGS) ×3 IMPLANT
DRSG OPSITE POSTOP 3X4 (GAUZE/BANDAGES/DRESSINGS) ×3 IMPLANT
DURAPREP 26ML APPLICATOR (WOUND CARE) ×3 IMPLANT
GLOVE BIOGEL PI IND STRL 7.0 (GLOVE) ×4 IMPLANT
GLOVE BIOGEL PI INDICATOR 7.0 (GLOVE) ×8
GLOVE ECLIPSE 7.0 STRL STRAW (GLOVE) ×3 IMPLANT
GOWN STRL REUS W/TWL LRG LVL3 (GOWN DISPOSABLE) ×6 IMPLANT
NEEDLE INSUFFLATION 120MM (ENDOMECHANICALS) IMPLANT
PACK LAPAROSCOPY BASIN (CUSTOM PROCEDURE TRAY) ×3 IMPLANT
PACK TRENDGUARD 450 HYBRID PRO (MISCELLANEOUS) IMPLANT
PACK TRENDGUARD 600 HYBRD PROC (MISCELLANEOUS) IMPLANT
PROTECTOR NERVE ULNAR (MISCELLANEOUS) ×6 IMPLANT
SUT VIC AB 3-0 X1 27 (SUTURE) ×3 IMPLANT
SUT VICRYL 0 UR6 27IN ABS (SUTURE) ×3 IMPLANT
SYR 30ML LL (SYRINGE) ×3 IMPLANT
TOWEL OR 17X24 6PK STRL BLUE (TOWEL DISPOSABLE) ×6 IMPLANT
TRENDGUARD 450 HYBRID PRO PACK (MISCELLANEOUS)
TRENDGUARD 600 HYBRID PROC PK (MISCELLANEOUS)
TROCAR XCEL NON-BLD 11X100MML (ENDOMECHANICALS) ×3 IMPLANT

## 2016-07-25 NOTE — Discharge Instructions (Addendum)

## 2016-07-25 NOTE — Anesthesia Preprocedure Evaluation (Addendum)
Anesthesia Evaluation  Patient identified by MRN, date of birth, ID band Patient awake    Reviewed: Allergy & Precautions, NPO status , Patient's Chart, lab work & pertinent test results  Airway Mallampati: III  TM Distance: >3 FB Neck ROM: Full    Dental no notable dental hx.    Pulmonary neg pulmonary ROS,    Pulmonary exam normal breath sounds clear to auscultation       Cardiovascular Exercise Tolerance: Good negative cardio ROS Normal cardiovascular exam Rhythm:Regular Rate:Normal     Neuro/Psych Spina bifida negative psych ROS   GI/Hepatic negative GI ROS, Neg liver ROS,   Endo/Other  negative endocrine ROS  Renal/GU negative Renal ROS  negative genitourinary   Musculoskeletal negative musculoskeletal ROS (+)   Abdominal   Peds negative pediatric ROS (+)  Hematology  (+) anemia ,   Anesthesia Other Findings Self caths. Inability to void due to Botox  Reproductive/Obstetrics negative OB ROS                            Anesthesia Physical Anesthesia Plan  ASA: II  Anesthesia Plan: General   Post-op Pain Management:    Induction: Intravenous  PONV Risk Score and Plan: 4 or greater and Ondansetron, Dexamethasone, Propofol, Midazolam and Scopolamine patch - Pre-op  Airway Management Planned:   Additional Equipment:   Intra-op Plan:   Post-operative Plan: Extubation in OR  Informed Consent: I have reviewed the patients History and Physical, chart, labs and discussed the procedure including the risks, benefits and alternatives for the proposed anesthesia with the patient or authorized representative who has indicated his/her understanding and acceptance.   Dental advisory given  Plan Discussed with: CRNA  Anesthesia Plan Comments:         Anesthesia Quick Evaluation

## 2016-07-25 NOTE — Op Note (Signed)
Hayley Calhoun 07/25/2016  PREOPERATIVE DIAGNOSIS:  Undesired fertility  POSTOPERATIVE DIAGNOSIS:  Undesired fertility  PROCEDURE:  Laparoscopic Bilateral Tubal Sterilization using Filshie Clips   SURGEON: Verita Schneiders, MD  ANESTHESIA:  General endotracheal  COMPLICATIONS:  None immediate.  ESTIMATED BLOOD LOSS:  5 ml.  FLUIDS: 800 ml LR.  URINE OUTPUT:  100 ml of clear urine.  INDICATIONS: 31 y.o. [id number]  with undesired fertility, desires permanent sterilization. Other reversible forms of contraception were discussed with patient; she declines all other modalities.  Risks of procedure discussed with patient including permanence of method, bleeding, infection, injury to surrounding organs and need for additional procedures including laparotomy, risk of regret.  Failure risk of 0.5-1% with increased risk of ectopic gestation if pregnancy occurs was also discussed with patient.      FINDINGS:  Normal uterus, tubes, and ovaries bilaterally.  TECHNIQUE:  The patient was taken to the operating room where general anesthesia was obtained without difficulty.  She was then placed in the dorsal lithotomy position and prepared and draped in sterile fashion.  After an adequate timeout was performed, a bivalved speculum was then placed in the patient's vagina, and the anterior lip of cervix grasped with the single-tooth tenaculum.  The uterine manipulator was then advanced into the uterus.  The speculum was removed from the vagina.  Attention was then turned to the patient's abdomen where a 11-mm skin incision was made in the umbilical fold.  The Optiview 11-mm trocar and sleeve were then advanced without difficulty with the laparoscope under direct visualization into the abdomen.  The abdomen was then insufflated with carbon dioxide gas and adequate pneumoperitoneum was obtained.  A survey of the patient's pelvis and abdomen revealed entirely normal anatomy.  The fallopian tubes were observed  and found to be normal in appearance. The Filshie clip applicator was placed through the operative port, and a Filshie clip was placed on the right fallopian tube ,about 2 cm from the cornual attachment, with care given to incorporate the underlying mesosalpinx.  A similar process was carried out on the contralateral side allowing for bilateral tubal sterilization.   Good hemostasis was noted overall.  Local analgesia was drizzled on both operative sites.The instruments were then removed from the patient's abdomen and the fascial incision was repaired with 0 Vicryl, and the skin was closed with Dermabond.  The uterine manipulator and the tenaculum were removed from the vagina without complications. The patient tolerated the procedure well.  Sponge, lap, and needle counts were correct times two.  The patient was then taken to the recovery room awake, extubated and in stable condition.   Verita Schneiders, MD, [facility] Attending [place], [facility] for [employer], [place]

## 2016-07-25 NOTE — H&P (Signed)
Preoperative History and Physical  Hayley Calhoun is a 31 y.o. [id number] here for surgical management of undesired fertility.  Patient also reports having AUB and wanted to have an add-on endometrial ablation today; she was informed this could not be done as prior authorization was not obtained from her insurance.  No other significant preoperative concerns.  Proposed surgery: Laparoscopic bilateral salpingectomy using Filshie clips   Past Medical History:  Diagnosis Date  . Bladder disorder   . Hypertension    on medication after delivery, but none currently  . Kidney stones   . Pregnant   . Self-catheterizes urinary bladder   . Spina bifida   . Staph infection    in bladder  . UTI (lower urinary tract infection)    Past Surgical History:  Procedure Laterality Date  . bladder botox    . INDUCED ABORTION    . INTERSTIM IMPLANT PLACEMENT    . LITHOTRIPSY    . spina bifida    . WISDOM TOOTH EXTRACTION     OB History  Gravida Para Term Preterm AB Living  10 3 3  0 6 3  SAB TAB Ectopic Multiple Live Births  6 0 0 0 3    # Outcome Date GA Lbr Len/2nd Weight Sex Delivery Anes PTL Lv  10 Gravida           9 Term 07/13/13 [redacted]w[redacted]d / 00:10 7 lb 3.5 oz (3.275 kg) F Vag-Spont EPI  LIV  8 SAB 04/2012 [redacted]w[redacted]d         7 SAB 2013 [redacted]w[redacted]d         6 SAB 2012 [redacted]w[redacted]d            Birth Comments: 5 wks  5 Term 02/10/09 [redacted]w[redacted]d  8 lb 10 oz (3.912 kg) M Vag-Spont EPI  LIV     Birth Comments: Gestational hypertension towards the end of pregnancy   4 SAB 2009 [redacted]w[redacted]d   M         Birth Comments: Bleeding; complete dilation upon arrival  3 SAB 2008 [redacted]w[redacted]d         2 SAB 2008 [redacted]w[redacted]d         1 Term 05/15/05 [redacted]w[redacted]d  5 lb 6 oz (2.438 kg) F Vag-Spont EPI  LIV     Birth Comments: Pt. reports gestational hypertension; remembers having protein in urine    Patient denies any other pertinent gynecologic issues.   No current facility-administered medications on file prior to encounter.    No current outpatient  prescriptions on file prior to encounter.   Allergies  Allergen Reactions  . Sulfa Antibiotics Shortness Of Breath and Itching  . Keflex [Cephalexin] Nausea And Vomiting  . Nitrofurantoin Monohyd Macro Nausea And Vomiting  . Penicillins Other (See Comments)    Reaction:  Unknown  Has patient had a PCN reaction causing immediate rash, facial/tongue/throat swelling, SOB or lightheadedness with hypotension: Unsure Has patient had a PCN reaction causing severe rash involving mucus membranes or skin necrosis: Unsure Has patient had a PCN reaction that required hospitalization Unsure Has patient had a PCN reaction occurring within the last 10 years: No If all of the above answers are "NO", then may proceed with Cephalosporin use. Childhood reaction unknown    Social History:   reports that she has never smoked. She has never used smokeless tobacco. She reports that she drinks alcohol. She reports that she does not use drugs.  Family History  Problem Relation Age of Onset  .  Scoliosis Mother   . Other Neg Hx     Review of Systems: Noncontributory  PHYSICAL EXAM: Blood pressure 132/79, pulse 82, temperature 98.1 F (36.7 C), temperature source Oral, resp. rate 18, SpO2 100 %, unknown if currently breastfeeding. CONSTITUTIONAL: Well-developed, well-nourished female in no acute distress.  HENT:  Normocephalic, atraumatic, External right and left ear normal. Oropharynx is clear and moist EYES: Conjunctivae and EOM are normal. Pupils are equal, round, and reactive to light. No scleral icterus.  NECK: Normal range of motion, supple, no masses SKIN: Skin is warm and dry. No rash noted. Not diaphoretic. No erythema. No pallor. NEUROLOGIC: Alert and oriented to person, place, and time. Normal reflexes, muscle tone coordination. No cranial nerve deficit noted. PSYCHIATRIC: Normal mood and affect. Normal behavior. Normal judgment and thought content. CARDIOVASCULAR: Normal heart rate noted,  regular rhythm RESPIRATORY: Effort and breath sounds normal, no problems with respiration noted ABDOMEN: Soft, nontender, nondistended. PELVIC: Deferred MUSCULOSKELETAL: Normal range of motion. No edema and no tenderness. 2+ distal pulses.  Labs: Results for orders placed or performed during the hospital encounter of 07/25/16 (from the past 336 hour(s))  Pregnancy, urine   Collection Time: 07/25/16 11:30 AM  Result Value Ref Range   Preg Test, Ur NEGATIVE NEGATIVE  Results for orders placed or performed during the hospital encounter of 07/18/16 (from the past 336 hour(s))  CBC   Collection Time: 07/18/16  9:05 AM  Result Value Ref Range   WBC 5.3 4.0 - 10.5 K/uL   RBC 4.79 3.87 - 5.11 MIL/uL   Hemoglobin 10.1 (L) 12.0 - 15.0 g/dL   HCT 30.2 (L) 36.0 - 46.0 %   MCV 63.0 (L) 78.0 - 100.0 fL   MCH 21.1 (L) 26.0 - 34.0 pg   MCHC 33.4 30.0 - 36.0 g/dL   RDW 19.7 (H) 11.5 - 15.5 %   Platelets 275 150 - 400 K/uL    Imaging Studies: No results found.  Assessment: Patient Active Problem List   Diagnosis Date Noted  . Complicated UTI (urinary tract infection) 05/26/2016  . Urinary tract infection in mother during first trimester of pregnancy 05/25/2016  . Nausea and vomiting in pregnancy prior to [redacted] weeks gestation 05/25/2016  . Ptyalism 05/25/2016  . Hyperemesis gravidarum before end of [redacted] week gestation with dehydration 05/25/2016  . Frequent UTI 07/09/2013  . Spina bifida ([place]) 06/06/2012  . 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 Chronic constipation 05/01/2012  . URINARY RETENTION 02/10/2009    Plan: Patient will undergo surgical management with laparoscopic bilateral salpingectomy using Filshie clips.  Other reversible forms of contraception were discussed with patient; she declines all other modalities. Discussed bilateral tubal sterilization in detail; risks and benefits discussed in detail including but not limited to: risk of regret, permanence of method, bleeding,  infection, injury to surrounding organs and need for additional procedures.  Failure risk of 1-2 % with increased risk of ectopic gestation if pregnancy occurs was also discussed with patient.  Patient verbalized understanding of these risks and benefits and wants to proceed with sterilization with laparoscopic bilateral sterilization using Filshie clips.  Routine postoperative instructions will be reviewed with the patient and her family in detail after surgery.  The patient concurred with the proposed plan, giving informed written consent for the surgery.  Patient has been NPO since last night and she will remain NPO for procedure.  Anesthesia and OR aware.  Preoperative SCDs ordered on call to the OR.  To OR when  ready.  Patient declined having this surgery rescheduled in order to do endometrial ablation at the same time.  She also declined starting the process of prior authorization now for her to get scheduled for this procedure at a later date. Will re-discuss this during her postoperative visit.    Verita Schneiders, M.D. 07/25/2016 12:34 PM

## 2016-07-25 NOTE — Anesthesia Procedure Notes (Signed)
Procedure Name: Intubation Date/Time: 07/25/2016 1:37 PM Performed by: Tobin Chad Pre-anesthesia Checklist: Patient identified, Emergency Drugs available, Suction available and Patient being monitored Patient Re-evaluated:Patient Re-evaluated prior to inductionOxygen Delivery Method: Circle system utilized and Simple face mask Preoxygenation: Pre-oxygenation with 100% oxygen Intubation Type: IV induction Ventilation: Mask ventilation without difficulty Laryngoscope Size: Mac and 3 Grade View: Grade II Tube type: Oral Tube size: 7.0 mm Number of attempts: 1 Airway Equipment and Method: Stylet Placement Confirmation: ETT inserted through vocal cords under direct vision and positive ETCO2 Secured at: 21 cm Tube secured with: Tape Dental Injury: Teeth and Oropharynx as per pre-operative assessment  Difficulty Due To: Difficulty was unanticipated

## 2016-07-25 NOTE — Transfer of Care (Signed)
Immediate Anesthesia Transfer of Care Note  Patient: Hayley Calhoun  Procedure(s) Performed: Procedure(s): LAPAROSCOPIC TUBAL LIGATION WITH FILSHIE CLIP (Bilateral)  Patient Location: PACU  Anesthesia Type:General  Level of Consciousness: awake, oriented, drowsy and patient cooperative  Airway & Oxygen Therapy: Patient Spontanous Breathing  Post-op Assessment: Report given to RN  Post vital signs: Reviewed and stable  Last Vitals:  Vitals:   07/25/16 1203  BP: 132/79  Pulse: 82  Resp: 18  Temp: 36.7 C    Last Pain:  Vitals:   07/25/16 1203  TempSrc: Oral      Patients Stated Pain Goal: 4 (37/94/32 7614)  Complications: No apparent anesthesia complications

## 2016-07-25 NOTE — Anesthesia Postprocedure Evaluation (Signed)
Anesthesia Post Note  Patient: Hayley Calhoun  Procedure(s) Performed: Procedure(s) (LRB): LAPAROSCOPIC TUBAL LIGATION WITH FILSHIE CLIP (Bilateral)     Patient location during evaluation: PACU Anesthesia Type: General Level of consciousness: awake and alert Pain management: pain level controlled Vital Signs Assessment: post-procedure vital signs reviewed and stable Respiratory status: spontaneous breathing, nonlabored ventilation, respiratory function stable and patient connected to nasal cannula oxygen Cardiovascular status: blood pressure returned to baseline and stable Postop Assessment: no signs of nausea or vomiting Anesthetic complications: no    Last Vitals:  Vitals:   07/25/16 1536 07/25/16 1639  BP:  127/80  Pulse: 70 70  Resp: 18 18  Temp: 37.5 C     Last Pain:  Vitals:   07/25/16 1536  TempSrc: Oral  PainSc:                  Ryan P Ellender

## 2016-07-26 ENCOUNTER — Encounter (HOSPITAL_COMMUNITY): Payer: Self-pay | Admitting: Obstetrics & Gynecology

## 2016-08-14 ENCOUNTER — Ambulatory Visit (INDEPENDENT_AMBULATORY_CARE_PROVIDER_SITE_OTHER): Payer: Medicaid Other | Admitting: Obstetrics and Gynecology

## 2016-08-14 VITALS — BP 147/85 | HR 74 | Wt 142.0 lb

## 2016-08-14 DIAGNOSIS — R300 Dysuria: Secondary | ICD-10-CM | POA: Diagnosis present

## 2016-08-14 DIAGNOSIS — Z9889 Other specified postprocedural states: Secondary | ICD-10-CM

## 2016-08-14 NOTE — Progress Notes (Signed)
31 yo here for post op check s/p laparoscopic bilateral tubal ligation on 6/13. Patient reports feeling well since her surgery. She denies any abdominal/pelvic pain. She reports some dysuria. She denies fever, chills or abnormal drainage from her incision  Past Medical History:  Diagnosis Date  . Bladder disorder   . Hypertension    on medication after delivery, but none currently  . Kidney stones   . Pregnant   . Self-catheterizes urinary bladder   . Spina bifida   . Staph infection    in bladder  . UTI (lower urinary tract infection)    Past Surgical History:  Procedure Laterality Date  . bladder botox    . INDUCED ABORTION    . INTERSTIM IMPLANT PLACEMENT    . LAPAROSCOPIC TUBAL LIGATION Bilateral 07/25/2016   Procedure: LAPAROSCOPIC TUBAL LIGATION WITH FILSHIE CLIP;  Surgeon: Osborne Oman, MD;  Location: [facility];  Service: Gynecology;  Laterality: Bilateral;  . LITHOTRIPSY    . spina bifida    . WISDOM TOOTH EXTRACTION     Family History  Problem Relation Age of Onset  . Scoliosis Mother   . Other Neg Hx    Social History  Substance Use Topics  . Smoking status: Never Smoker  . Smokeless tobacco: Never Used  . Alcohol use Yes     Comment: occ   ROS See pertinent in HPI  Blood pressure (!) 147/85, pulse 74, weight 142 lb (64.4 kg), unknown if currently breastfeeding. GENERAL: Well-developed, well-nourished female in no acute distress.  ABDOMEN: Soft, nontender, nondistended.  Incision: no erythema, induration or drainage EXTREMITIES: No cyanosis, clubbing, or edema, 2+ distal pulses.  A/P 31 yo here for post op check  - Patient is medically cleared to resume all activities of daily living - Patient with elevated BP today. Advised to start healthy lifestyle changes and to exercise regularly at least 150 minutes per week. Patient also advised to follow up with PCP - urine culture collected - Patient will be contacted with any abnormal results

## 2016-08-16 LAB — URINE CULTURE: ORGANISM ID, BACTERIA: NO GROWTH

## 2016-12-20 ENCOUNTER — Encounter (HOSPITAL_COMMUNITY): Payer: Self-pay | Admitting: *Deleted

## 2016-12-20 ENCOUNTER — Other Ambulatory Visit: Payer: Self-pay

## 2016-12-20 ENCOUNTER — Emergency Department (HOSPITAL_COMMUNITY)
Admission: EM | Admit: 2016-12-20 | Discharge: 2016-12-20 | Disposition: A | Discharge status: Home / Self Care | Payer: BC Managed Care – PPO | Attending: Emergency Medicine | Admitting: Emergency Medicine

## 2016-12-20 DIAGNOSIS — G43909 Migraine, unspecified, not intractable, without status migrainosus: Secondary | ICD-10-CM | POA: Diagnosis not present

## 2016-12-20 DIAGNOSIS — I10 Essential (primary) hypertension: Secondary | ICD-10-CM | POA: Diagnosis not present

## 2016-12-20 DIAGNOSIS — R51 Headache: Secondary | ICD-10-CM | POA: Diagnosis present

## 2016-12-20 MED ORDER — IBUPROFEN 400 MG PO TABS
600.0000 mg | ORAL_TABLET | Freq: Once | ORAL | Status: AC
  Administered 2016-12-20: 600 mg via ORAL
  Filled 2016-12-20: qty 1

## 2016-12-20 NOTE — Discharge Instructions (Addendum)
As discussed, follow-up with the neurology office Dr. Lorraine Lax. Follow-up with your primary care provider. Discuss your blood pressure with your primary care provider.  Make sure you stay well hydrated. Take ibuprofen as needed.  Return if severe onset new headache, vomiting, visual disturbances, weakness numbness heaviness to one side or any other new concerning symptoms in the meantime.

## 2016-12-20 NOTE — ED Notes (Signed)
States she was at work and her vision became blurry for 30-35 minutes once her vision return to normal c/o sharp pain in her head.

## 2016-12-20 NOTE — ED Triage Notes (Signed)
To ED for eval after noticed blurred vision while at work. The vision problem has resolved but now pt complains of HA. No photophobia. Doesn't report HAs often. Speech clear. Appears in nad.

## 2016-12-20 NOTE — ED Provider Notes (Signed)
[facility] EMERGENCY DEPARTMENT Provider Note   CSN: [id number] Arrival date & time: 12/20/16  1113     History   Chief Complaint Chief Complaint  Patient presents with  . vision changes  . Headache    HPI Hayley Calhoun is a 31 y.o. female presenting with visual disturbances and headache that started suddenly while at work. She is a Pharmacist, hospital and reports that suddenly she started seeing flashes of lights and halos which lasted for approximately 30 minutes and she subsequently experienced a pressure headache with intermittent shooting pains. She states that the visual disturbances resolved and the headache came afterwards but the headache has now mostly subsided. She denies history of same in the past. She denies any focal weakness, heaviness, nausea vomiting or any other symptoms.  She declines any migraine cocktail as she does not want to feel sleepy and states that ibuprofen works well for her.  HPI  Past Medical History:  Diagnosis Date  . Bladder disorder   . Hypertension    on medication after delivery, but none currently  . Kidney stones   . Pregnant   . Self-catheterizes urinary bladder   . Spina bifida   . Staph infection    in bladder  . UTI (lower urinary tract infection)     Patient Active Problem List   Diagnosis Date Noted  . Encounter for female sterilization procedure   . Complicated UTI (urinary tract infection) 05/26/2016  . Urinary tract infection in mother during first trimester of pregnancy 05/25/2016  . Nausea and vomiting in pregnancy prior to [redacted] weeks gestation 05/25/2016  . Ptyalism 05/25/2016  . Hyperemesis gravidarum before end of [redacted] week gestation with dehydration 05/25/2016  . Frequent UTI 07/09/2013  . Spina bifida ([place]) 06/06/2012  . 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 Chronic constipation 05/01/2012  . URINARY RETENTION 02/10/2009    Past Surgical History:  Procedure Laterality Date  . bladder botox     . INDUCED ABORTION    . INTERSTIM IMPLANT PLACEMENT    . LITHOTRIPSY    . spina bifida    . WISDOM TOOTH EXTRACTION      OB History    Gravida Para Term Preterm AB Living   10 3 3  0 6 3   SAB TAB Ectopic Multiple Live Births   6 0 0 0 3       Home Medications    Prior to Admission medications   Medication Sig Start Date End Date Taking? Authorizing Provider  docusate sodium (COLACE) 100 MG capsule Take 1 capsule (100 mg total) by mouth 2 (two) times daily as needed. Patient not taking: Reported on 08/14/2016 07/25/16   Anyanwu, Sallyanne Havers, MD  ibuprofen (ADVIL,MOTRIN) 800 MG tablet Take 1 tablet (800 mg total) by mouth every 8 (eight) hours as needed for mild pain or cramping. Patient not taking: Reported on 08/14/2016 07/25/16   Osborne Oman, MD  oxyCODONE-acetaminophen (PERCOCET/ROXICET) 5-325 MG tablet Take 1-2 tablets by mouth every 6 (six) hours as needed. Patient not taking: Reported on 08/14/2016 07/25/16   Osborne Oman, MD    Family History Family History  Problem Relation Age of Onset  . Scoliosis Mother   . Other Neg Hx     Social History Social History   Tobacco Use  . Smoking status: Never Smoker  . Smokeless tobacco: Never Used  Substance Use Topics  . Alcohol use: Yes    Comment: occ  .  Drug use: No     Allergies   Sulfa antibiotics; Keflex [cephalexin]; Nitrofurantoin monohyd macro; and Penicillins   Review of Systems Review of Systems  Constitutional: Negative for chills and fever.  HENT: Negative for congestion, ear pain and sore throat.   Eyes: Positive for visual disturbance. Negative for photophobia, pain and redness.  Respiratory: Negative for cough, chest tightness and shortness of breath.   Cardiovascular: Negative for chest pain and leg swelling.  Gastrointestinal: Negative for abdominal pain, nausea and vomiting.  Genitourinary: Negative for dysuria, flank pain and hematuria.  Musculoskeletal: Negative for gait problem, neck pain  and neck stiffness.  Skin: Negative for rash.  Neurological: Positive for headaches. Negative for dizziness, facial asymmetry, weakness and light-headedness.  Psychiatric/Behavioral: Negative for behavioral problems.     Physical Exam Updated Vital Signs BP (!) 132/92 (BP Location: Right Arm)   Pulse 80   Temp 98.9 F (37.2 C) (Oral)   Resp 16   SpO2 100%   Physical Exam  Constitutional: She is oriented to person, place, and time. She appears well-developed and well-nourished.  Non-toxic appearance. She does not appear ill. No distress.  Afebrile, nontoxic-appearing, sitting comfortable in chair in no acute distress.  HENT:  Head: Normocephalic and atraumatic.  Mouth/Throat: Oropharynx is clear and moist.  Eyes: Conjunctivae and EOM are normal. Pupils are equal, round, and reactive to light. Right eye exhibits normal extraocular motion and no nystagmus. Left eye exhibits normal extraocular motion and no nystagmus. Right pupil is round and reactive. Left pupil is round and reactive. Pupils are equal.  Neck: Normal range of motion. Neck supple. No neck rigidity.  Cardiovascular: Normal rate, regular rhythm and intact distal pulses.  No murmur heard. Pulmonary/Chest: Effort normal and breath sounds normal. No stridor. No respiratory distress. She has no wheezes. She has no rales.  Abdominal: She exhibits no distension.  Musculoskeletal: She exhibits no edema or tenderness.  Neurological: She is alert and oriented to person, place, and time. She has normal strength. She is not disoriented. No cranial nerve deficit or sensory deficit. Coordination and gait normal. GCS eye subscore is 4. GCS verbal subscore is 5. GCS motor subscore is 6.  Neurologic Exam:  - Mental status: Patient is alert and cooperative. Fluent speech and words are clear. Coherent thought processes and insight is good. Patient is oriented x 4 to person, place, time and event.  - Cranial nerves:  CN III, IV, VI: pupils  equally round, reactive to light both direct and conscensual. Full extra-ocular movement. CN V: motor temporalis and masseter strength intact. CN VII : muscles of facial expression intact. CN X :  midline uvula. XI strength of sternocleidomastoid and trapezius muscles 5/5, XII: tongue is midline when protruded. - Motor: No involuntary movements. Muscle tone and bulk normal throughout. Muscle strength is 5/5 in bilateral shoulder abduction, elbow flexion and extension, grip, hip extension, flexion, leg flexion and extension, ankle right: 3/5 (baseline for patient) and left 5/5 dorsiflexion and plantar flexion.  - Sensory: Proprioception, light tough sensation intact in all extremities.  - Cerebellar: rapid alternating movements and point to point movement intact in upper and lower extremities. Normal stance and gait.  Skin: Skin is warm and dry. No rash noted. She is not diaphoretic. No cyanosis or erythema. No pallor.  Psychiatric: She has a normal mood and affect.  Nursing note and vitals reviewed.     Visual Acuity  Right Eye Distance: 20/16 Left Eye Distance: 20/16 Bilateral Distance: 20/16  ED Treatments / Results  Labs (all labs ordered are listed, but only abnormal results are displayed) Labs Reviewed - No data to display  EKG  EKG Interpretation None       Radiology No results found.  Procedures Procedures (including critical care time)  Medications Ordered in ED Medications  ibuprofen (ADVIL,MOTRIN) tablet 600 mg (600 mg Oral Given 12/20/16 1519)     Initial Impression / Assessment and Plan / ED Course  I have reviewed the triage vital signs and the nursing notes.  Pertinent labs & imaging results that were available during my care of the patient were reviewed by me and considered in my medical decision making (see chart for details).    Patient significantly improved since onset of symptoms and visual disturbances had resolved prior to her pressure headache. No  head trauma. No anticoagulant use.  Patient declined migraine cocktail while in the emergency department. She was given ibuprofen with relief.  Normal neuro exam, overall reassuring exam and workup. No new focal deficits. Symptoms have resolved.  Will discharge home with symptomatic relief and follow up with neurology. Suspect this could be migraine with aura. Advised hydration and follow-up with PCP regarding blood pressure.  Patient was stable well-appearing and ready for discharge.  Discussed strict return precautions and advised to return to the emergency department if experiencing any new or worsening symptoms. Instructions were understood and patient agreed with discharge plan.  Final Clinical Impressions(s) / ED Diagnoses   Final diagnoses:  Migraine without status migrainosus, not intractable, unspecified migraine type    ED Discharge Orders    None       Dossie Der 12/20/16 1540    Sherwood Gambler, MD 12/22/16 1949

## 2017-02-06 ENCOUNTER — Ambulatory Visit: Payer: BC Managed Care – PPO | Admitting: Obstetrics & Gynecology

## 2017-02-06 ENCOUNTER — Encounter: Payer: Self-pay | Admitting: *Deleted

## 2017-02-06 NOTE — Progress Notes (Signed)
Loletta Specter Orrick did not keep her scheduled appointment for consult. Per discussion with Dr.Anyanwu does not need to be called, may reschedule if she calls.

## 2017-11-19 IMAGING — US US TRANSVAGINAL NON-OB
1 series · 14 of 25 positions shown · non-contrast
Comparison: None

CLINICAL DATA: Abnormal uterine bleeding for over 1 year. Fibroids.
LMP 02/11/2015.

EXAM:
TRANSABDOMINAL AND TRANSVAGINAL ULTRASOUND OF PELVIS
TECHNIQUE: Both transabdominal and transvaginal ultrasound examinations of the
pelvis were performed. Transabdominal technique was performed for
global imaging of the pelvis including uterus, ovaries, adnexal
regions, and pelvic cul-de-sac. It was necessary to proceed with
endovaginal exam following the transabdominal exam to visualize the
endometrium, small fibroids, and left adnexal cyst.

[Series 1: us transvaginal non-ob · 14 of 46 slices shown]
[im 1/46]
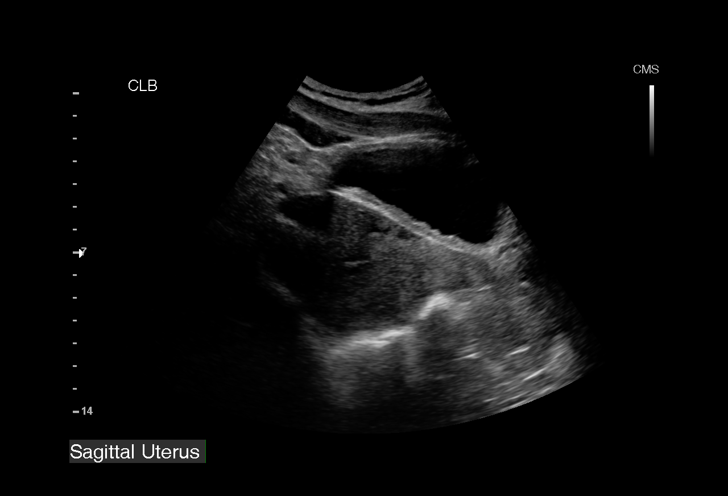
[im 4/46]
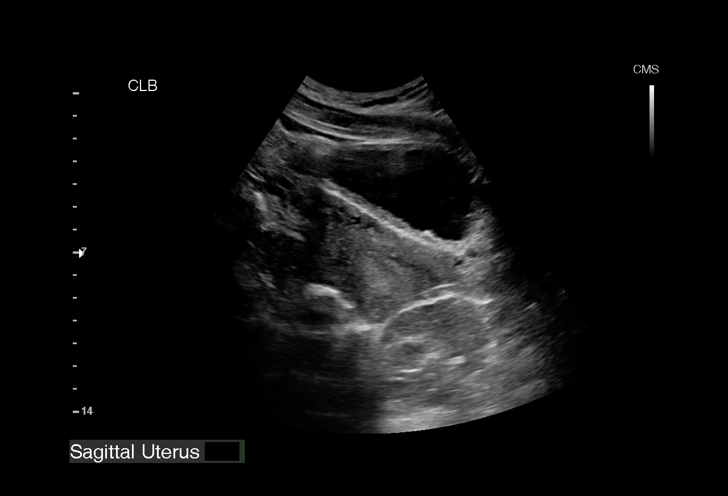
[im 8/46]
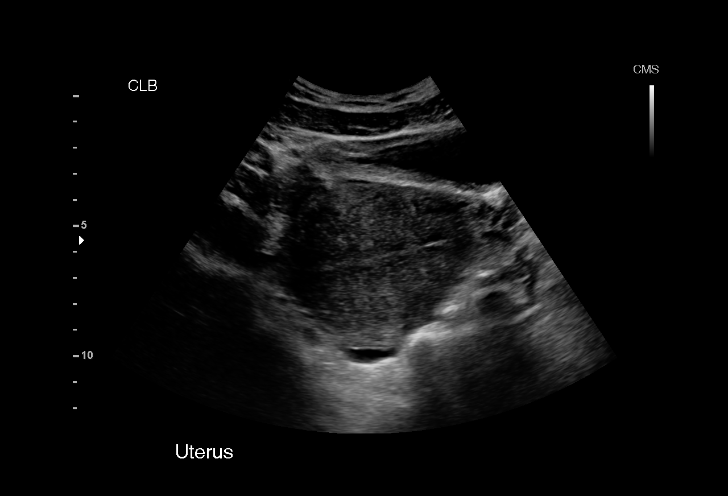
[im 12/46]
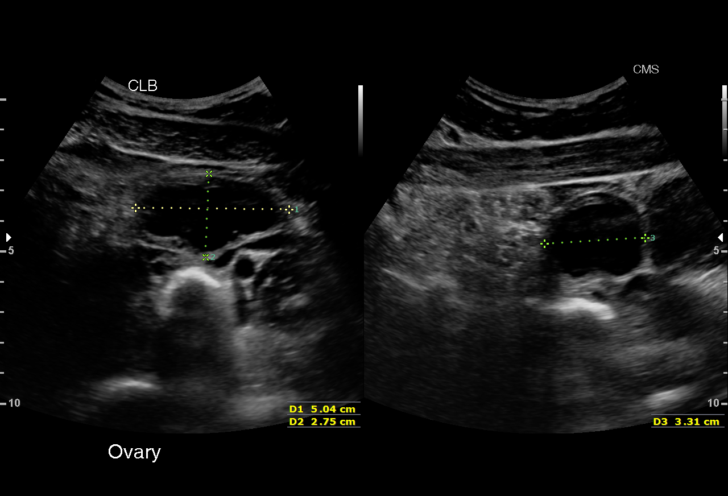
[im 16/46]
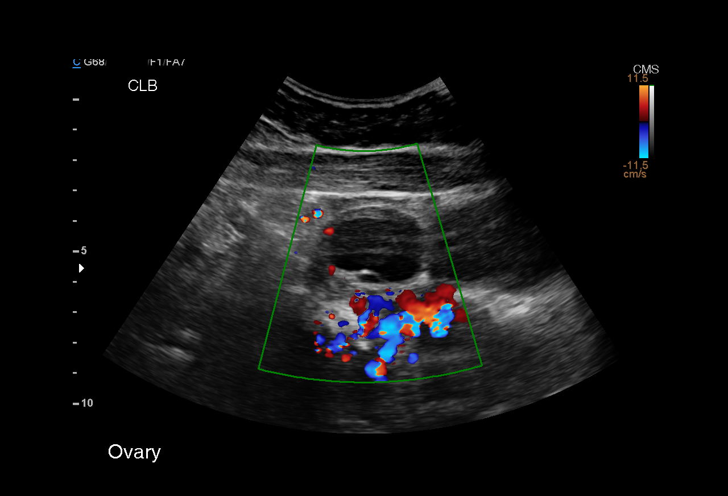
[im 17/46]
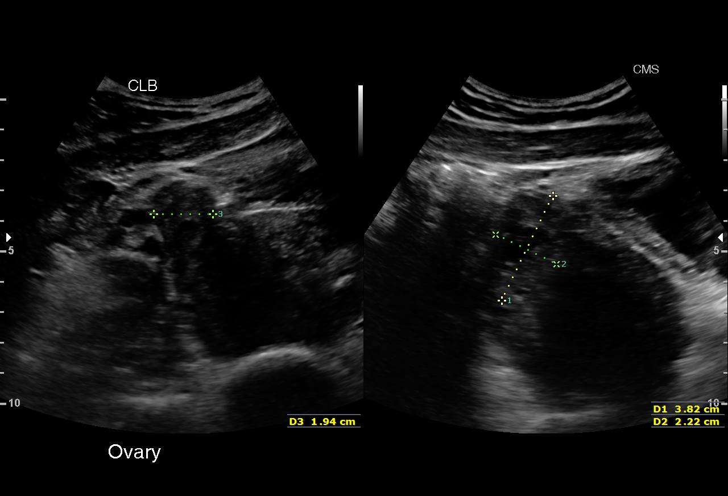
[im 21/46]
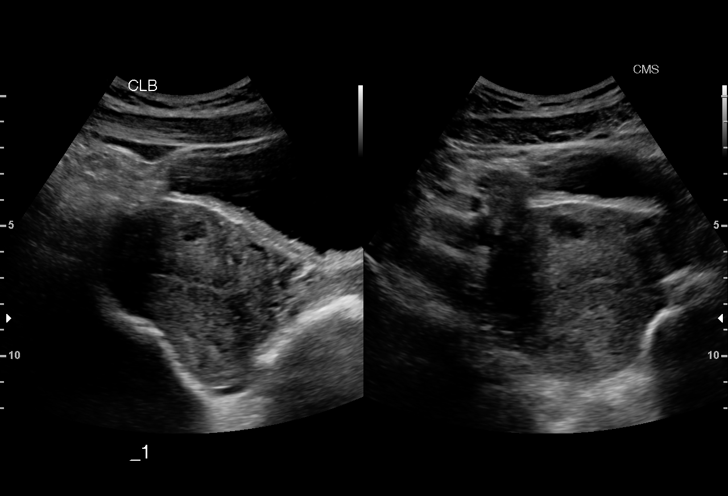
[im 25/46]
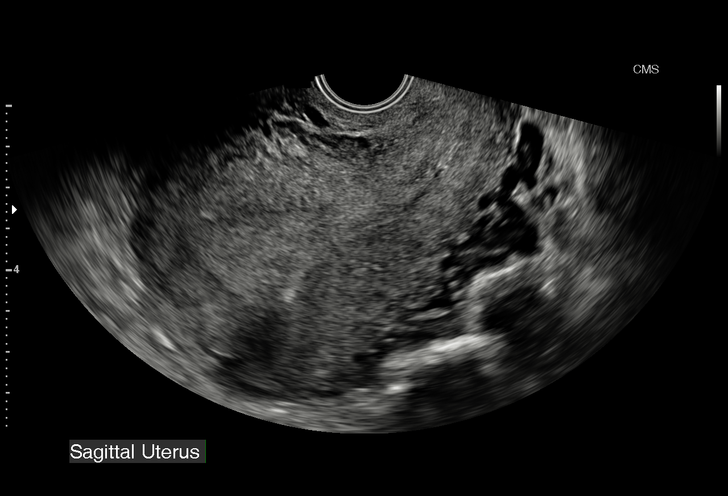
[im 29/46]
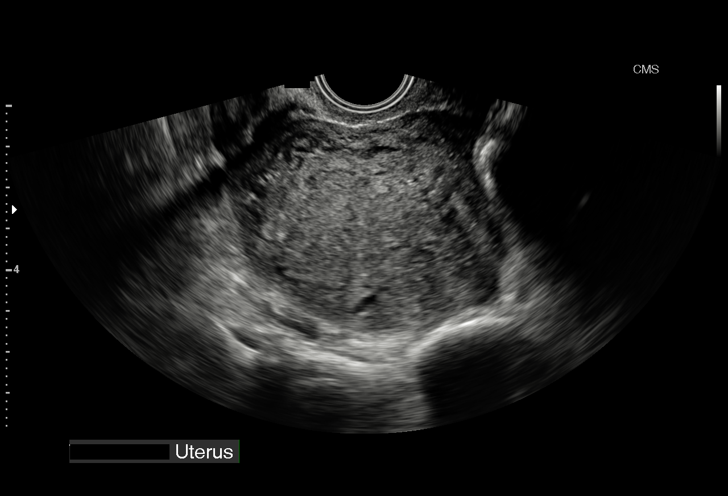
[im 31/46]
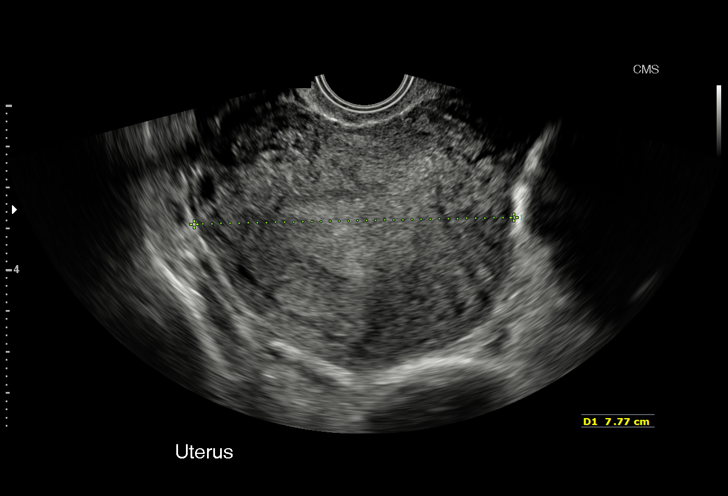
[im 34/46]
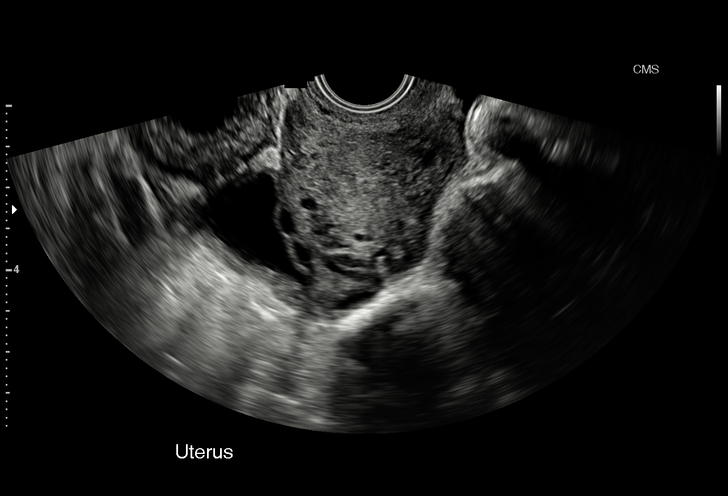
[im 38/46]
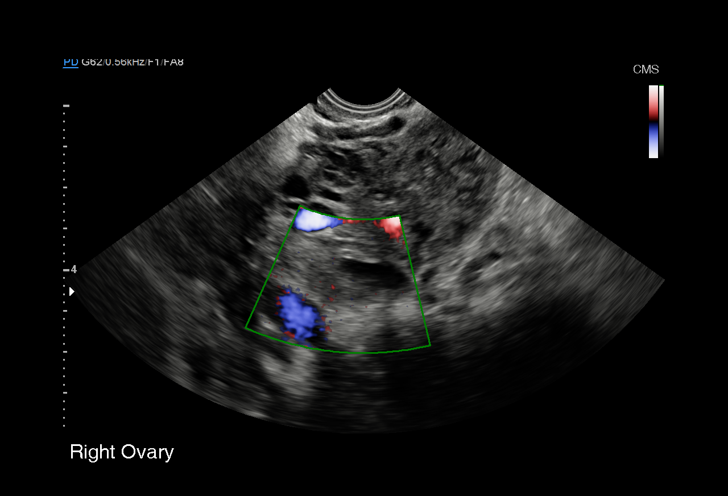
[im 42/46]
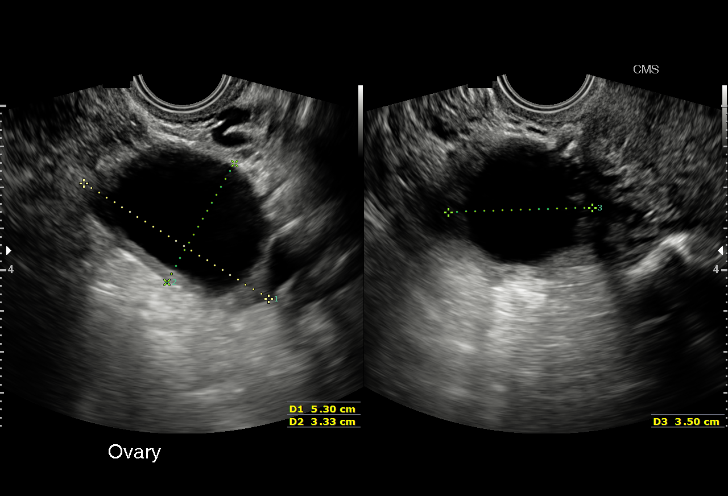
[im 46/46]
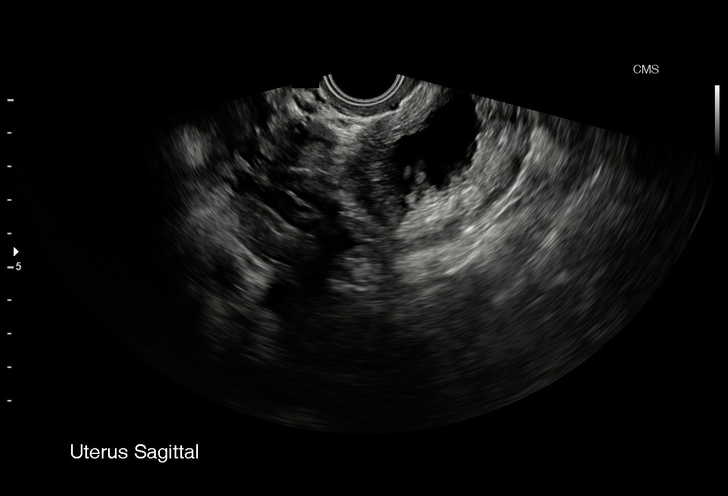

[14 of 25 positions shown; findings below may reference images not displayed]

FINDINGS: Uterus

Measurements: 10.9 x 5.8 x 7.5 cm. Diffusely heterogeneous
echotexture of myometrium noted. A distinct intramural fibroid is
seen in the right anterior uterine corpus measuring 1.2 cm. No other
distinct fibroids identified.

Endometrium

Thickness: 18 mm.  No focal abnormality visualized.

Right ovary

Measurements: 3.4 x 3.2 x 1.9 cm. Normal appearance/no adnexal mass.

Left ovary

Measurements: 5.3 x 3.5 x 3.3 cm. A simple appearing cyst is seen
which measures 4.1 x 2.6 x 3.3 cm, and has benign features.

Other findings

No abnormal free fluid.
IMPRESSION: Diffusely heterogeneous myometrial echotexture, with at least 1 tiny
fibroid measuring 1.2 cm.

Endometrial thickness measures 18 mm. If bleeding remains
unresponsive to hormonal or medical therapy, focal lesion work-up
with sonohysterogram should be considered. Endometrial biopsy should
also be considered in pre-menopausal patients at high risk for
endometrial carcinoma. (Ref: Radiological Reasoning: Algorithmic
Workup of Abnormal Vaginal Bleeding with Endovaginal Sonography and
Sonohysterography. AJR 0117; 191:S68-73).

4 cm left ovarian cyst with benign characteristics, most likely
physiologic. No imaging followup required in a reproductive age
female. This recommendation follows the consensus statement:
Management of Asymptomatic Ovarian and Other Adnexal Cysts Imaged at
US: Society of Radiologists in Ultrasound Consensus Conference

## 2018-07-10 ENCOUNTER — Other Ambulatory Visit: Payer: Self-pay | Admitting: Urology

## 2018-07-11 ENCOUNTER — Other Ambulatory Visit: Payer: Self-pay | Admitting: Urology

## 2018-07-11 MED ORDER — ONABOTULINUMTOXINA 100 UNITS IJ SOLR: 200.0000 [IU] | Freq: Once | INTRAMUSCULAR | Status: AC

## 2018-08-14 ENCOUNTER — Encounter (HOSPITAL_BASED_OUTPATIENT_CLINIC_OR_DEPARTMENT_OTHER): Payer: Self-pay | Admitting: *Deleted

## 2018-08-14 ENCOUNTER — Other Ambulatory Visit (HOSPITAL_COMMUNITY)
Admission: RE | Admit: 2018-08-14 | Discharge: 2018-08-14 | Disposition: A | Discharge status: Home / Self Care | Payer: BC Managed Care – PPO | Source: Ambulatory Visit | Attending: Urology | Admitting: Urology

## 2018-08-14 ENCOUNTER — Other Ambulatory Visit: Payer: Self-pay

## 2018-08-14 DIAGNOSIS — Z1159 Encounter for screening for other viral diseases: Secondary | ICD-10-CM | POA: Diagnosis present

## 2018-08-14 LAB — SARS CORONAVIRUS 2 (TAT 6-24 HRS): SARS Coronavirus 2: NEGATIVE

## 2018-08-14 NOTE — Progress Notes (Signed)

## 2018-08-14 NOTE — Progress Notes (Signed)
Spoke w/ pt via phone for pre-op interview.  Npo after mn w/ exception clear liquid diet until 0900 then nothing by mouth, pt verbalized understanding.  Arrive at 1300.  Needs urine preg.  Pt had covid test done today.  Pt self cath's, pt will bring her own catheter.

## 2018-08-18 ENCOUNTER — Encounter (HOSPITAL_BASED_OUTPATIENT_CLINIC_OR_DEPARTMENT_OTHER): Payer: Self-pay | Admitting: *Deleted

## 2018-08-18 ENCOUNTER — Encounter (HOSPITAL_BASED_OUTPATIENT_CLINIC_OR_DEPARTMENT_OTHER)
Admission: RE | Disposition: A | Discharge status: Home / Self Care | Payer: Self-pay | Source: Home / Self Care | Attending: Urology

## 2018-08-18 ENCOUNTER — Ambulatory Visit (HOSPITAL_BASED_OUTPATIENT_CLINIC_OR_DEPARTMENT_OTHER)
Admission: RE | Admit: 2018-08-18 | Discharge: 2018-08-18 | Disposition: A | Discharge status: Home / Self Care | Payer: BC Managed Care – PPO | Attending: Urology | Admitting: Urology

## 2018-08-18 ENCOUNTER — Other Ambulatory Visit: Payer: Self-pay

## 2018-08-18 ENCOUNTER — Ambulatory Visit (HOSPITAL_BASED_OUTPATIENT_CLINIC_OR_DEPARTMENT_OTHER): Payer: BC Managed Care – PPO | Admitting: Certified Registered Nurse Anesthetist

## 2018-08-18 DIAGNOSIS — Q059 Spina bifida, unspecified: Secondary | ICD-10-CM | POA: Diagnosis not present

## 2018-08-18 DIAGNOSIS — N319 Neuromuscular dysfunction of bladder, unspecified: Secondary | ICD-10-CM | POA: Diagnosis not present

## 2018-08-18 DIAGNOSIS — R32 Unspecified urinary incontinence: Secondary | ICD-10-CM | POA: Diagnosis present

## 2018-08-18 HISTORY — DX: Neuromuscular dysfunction of bladder, unspecified: N31.9

## 2018-08-18 HISTORY — PX: BOTOX INJECTION: SHX5754

## 2018-08-18 HISTORY — DX: Presence of spectacles and contact lenses: Z97.3

## 2018-08-18 HISTORY — PX: CYSTOSCOPY: SHX5120

## 2018-08-18 HISTORY — DX: Personal history of urinary calculi: Z87.442

## 2018-08-18 LAB — POCT PREGNANCY, URINE: Preg Test, Ur: NEGATIVE

## 2018-08-18 SURGERY — CYSTOSCOPY
Anesthesia: General

## 2018-08-18 MED ORDER — FENTANYL CITRATE (PF) 100 MCG/2ML IJ SOLN
INTRAMUSCULAR | Status: DC | PRN
  Administered 2018-08-18: 25 ug via INTRAVENOUS

## 2018-08-18 MED ORDER — CEFAZOLIN SODIUM-DEXTROSE 2-4 GM/100ML-% IV SOLN
INTRAVENOUS | Status: AC
  Filled 2018-08-18: qty 100

## 2018-08-18 MED ORDER — CEFAZOLIN SODIUM-DEXTROSE 2-4 GM/100ML-% IV SOLN
2.0000 g | INTRAVENOUS | Status: AC
  Administered 2018-08-18: 2 g via INTRAVENOUS
  Filled 2018-08-18: qty 100

## 2018-08-18 MED ORDER — ONABOTULINUMTOXINA 100 UNITS IJ SOLR
INTRAMUSCULAR | Status: DC | PRN
  Administered 2018-08-18: 200 [IU]

## 2018-08-18 MED ORDER — DEXAMETHASONE SODIUM PHOSPHATE 10 MG/ML IJ SOLN
INTRAMUSCULAR | Status: DC | PRN
  Administered 2018-08-18: 5 mg via INTRAVENOUS

## 2018-08-18 MED ORDER — PROPOFOL 10 MG/ML IV BOLUS
INTRAVENOUS | Status: DC | PRN
  Administered 2018-08-18: 40 mg via INTRAVENOUS
  Administered 2018-08-18: 160 mg via INTRAVENOUS

## 2018-08-18 MED ORDER — MIDAZOLAM HCL 2 MG/2ML IJ SOLN
INTRAMUSCULAR | Status: AC
  Filled 2018-08-18: qty 2

## 2018-08-18 MED ORDER — ONDANSETRON HCL 4 MG/2ML IJ SOLN
INTRAMUSCULAR | Status: AC
  Filled 2018-08-18: qty 2

## 2018-08-18 MED ORDER — LACTATED RINGERS IV SOLN
INTRAVENOUS | Status: DC
  Administered 2018-08-18 (×2): via INTRAVENOUS
  Filled 2018-08-18: qty 1000

## 2018-08-18 MED ORDER — FENTANYL CITRATE (PF) 100 MCG/2ML IJ SOLN
25.0000 ug | INTRAMUSCULAR | Status: DC | PRN
  Filled 2018-08-18: qty 1

## 2018-08-18 MED ORDER — PROMETHAZINE HCL 25 MG/ML IJ SOLN
6.2500 mg | INTRAMUSCULAR | Status: DC | PRN
  Filled 2018-08-18: qty 1

## 2018-08-18 MED ORDER — SODIUM CHLORIDE (PF) 0.9 % IJ SOLN
INTRAMUSCULAR | Status: AC
  Filled 2018-08-18: qty 100

## 2018-08-18 MED ORDER — TRAMADOL HCL 50 MG PO TABS: 50.0000 mg | ORAL_TABLET | Freq: Four times a day (QID) | ORAL | 0 refills | Status: DC | PRN

## 2018-08-18 MED ORDER — LIDOCAINE HCL (CARDIAC) PF 100 MG/5ML IV SOSY
PREFILLED_SYRINGE | INTRAVENOUS | Status: DC | PRN
  Administered 2018-08-18: 60 mg via INTRAVENOUS

## 2018-08-18 MED ORDER — ONDANSETRON HCL 4 MG/2ML IJ SOLN
INTRAMUSCULAR | Status: DC | PRN
  Administered 2018-08-18: 4 mg via INTRAVENOUS

## 2018-08-18 MED ORDER — SODIUM CHLORIDE 0.9 % IR SOLN
Status: DC | PRN
  Administered 2018-08-18: 3000 mL

## 2018-08-18 MED ORDER — PROPOFOL 10 MG/ML IV BOLUS
INTRAVENOUS | Status: AC
  Filled 2018-08-18: qty 20

## 2018-08-18 MED ORDER — SODIUM CHLORIDE (PF) 0.9 % IJ SOLN
INTRAMUSCULAR | Status: DC | PRN
  Administered 2018-08-18: 20 mL

## 2018-08-18 MED ORDER — FENTANYL CITRATE (PF) 100 MCG/2ML IJ SOLN
INTRAMUSCULAR | Status: AC
  Filled 2018-08-18: qty 2

## 2018-08-18 MED ORDER — MIDAZOLAM HCL 2 MG/2ML IJ SOLN
INTRAMUSCULAR | Status: DC | PRN
  Administered 2018-08-18: 2 mg via INTRAVENOUS

## 2018-08-18 MED ORDER — LIDOCAINE 2% (20 MG/ML) 5 ML SYRINGE
INTRAMUSCULAR | Status: AC
  Filled 2018-08-18: qty 5

## 2018-08-18 MED ORDER — DEXAMETHASONE SODIUM PHOSPHATE 10 MG/ML IJ SOLN
INTRAMUSCULAR | Status: AC
  Filled 2018-08-18: qty 1

## 2018-08-18 SURGICAL SUPPLY — 27 items
BAG DRAIN URO-CYSTO SKYTR STRL (DRAIN) ×3 IMPLANT
CATH ROBINSON RED A/P 16FR (CATHETERS) IMPLANT
CLOTH BEACON ORANGE TIMEOUT ST (SAFETY) ×3 IMPLANT
GLOVE BIO SURGEON STRL SZ 6.5 (GLOVE) ×2 IMPLANT
GLOVE BIO SURGEON STRL SZ7.5 (GLOVE) ×3 IMPLANT
GLOVE BIO SURGEON STRL SZ8 (GLOVE) ×3 IMPLANT
GLOVE BIO SURGEONS STRL SZ 6.5 (GLOVE) ×1
GLOVE BIOGEL PI IND STRL 7.0 (GLOVE) ×1 IMPLANT
GLOVE BIOGEL PI INDICATOR 7.0 (GLOVE) ×2
GOWN STRL REUS W/ TWL LRG LVL3 (GOWN DISPOSABLE) ×1 IMPLANT
GOWN STRL REUS W/ TWL XL LVL3 (GOWN DISPOSABLE) ×2 IMPLANT
GOWN STRL REUS W/TWL LRG LVL3 (GOWN DISPOSABLE) ×2
GOWN STRL REUS W/TWL XL LVL3 (GOWN DISPOSABLE) ×4
IV NS IRRIG 3000ML ARTHROMATIC (IV SOLUTION) ×3 IMPLANT
KIT TURNOVER CYSTO (KITS) ×3 IMPLANT
MANIFOLD NEPTUNE II (INSTRUMENTS) ×3 IMPLANT
NDL SAFETY ECLIPSE 18X1.5 (NEEDLE) ×1 IMPLANT
NEEDLE ASPIRATION 22 (NEEDLE) ×3 IMPLANT
NEEDLE HYPO 18GX1.5 SHARP (NEEDLE) ×2
NS IRRIG 500ML POUR BTL (IV SOLUTION) ×3 IMPLANT
PACK CYSTO (CUSTOM PROCEDURE TRAY) ×3 IMPLANT
SYR 10ML LL (SYRINGE) ×6 IMPLANT
SYR 20CC LL (SYRINGE) ×3 IMPLANT
SYR CONTROL 10ML LL (SYRINGE) ×3 IMPLANT
TUBE CONNECTING 12'X1/4 (SUCTIONS) ×1
TUBE CONNECTING 12X1/4 (SUCTIONS) ×2 IMPLANT
WATER STERILE IRR 3000ML UROMA (IV SOLUTION) IMPLANT

## 2018-08-18 NOTE — H&P (Signed)
Urology Admission H&P  Chief Complaint: urinary incontinence  History of Present Illness: Hayley Calhoun is a 33 yo with a hx of tethered cord and neurogenic bladder here for intravesical botox injection. She catheterizes every 3-4 hours and leaks between catheterizations. She has a DLPP >40. No recent UTIs  Past Medical History:  Diagnosis Date  . History of hypertension    per pt during pregnancy and after,  was taking medication until 2018 approx.,  no issue w/ bp since  . History of kidney stones   . Neurogenic bladder   . Self-catheterizes urinary bladder   . Spina bifida   . Wears glasses    Past Surgical History:  Procedure Laterality Date  . CYSTOSCOPY W/ BOTOX INJECTION  LAST ONE 2016  . ENDOMETRIAL ABLATION  2018   per pt in gyn office  . EXTRACORPOREAL SHOCK WAVE LITHOTRIPSY  2011  approx  . INTERSTIM IMPLANT PLACEMENT  09/2012   @[facility]  in [place]  . INTERSTIM IMPLANT REMOVAL  05-02-2015   @ [facility] in [place]  . LAPAROSCOPIC TUBAL LIGATION Bilateral 07/25/2016   Procedure: LAPAROSCOPIC TUBAL LIGATION WITH FILSHIE CLIP;  Surgeon: Osborne Oman, MD;  Location: [facility];  Service: Gynecology;  Laterality: Bilateral;  . SPINE SURGERY  infant   closure of spinal defect (spina bifida)  . WISDOM TOOTH EXTRACTION      Home Medications:  Current Facility-Administered Medications  Medication Dose Route Frequency Provider Last Rate Last Dose  . ceFAZolin (ANCEF) IVPB 2g/100 mL premix  2 g Intravenous 30 min Pre-Op Alyson Ingles Candee Furbish, MD      . lactated ringers infusion   Intravenous Continuous Myrtie Soman, MD 50 mL/hr at 08/18/18 1355     Facility-Administered Medications Ordered in Other Encounters  Medication Dose Route Frequency Provider Last Rate Last Dose  . botulinum toxin Type A (BOTOX) injection 200 Units  200 Units Intramuscular Once Marlene Pfluger, Candee Furbish, MD       Allergies:  Allergies  Allergen Reactions  . Sulfa Antibiotics Shortness Of Breath and Itching  .  Sulfamethizole Anaphylaxis  . Keflex [Cephalexin] Nausea And Vomiting  . Nitrofurantoin Monohyd Macro Nausea And Vomiting    Unable to tolerate higher dose than 50 mg per pt  . Penicillins Other (See Comments)    Reaction:  Unknown  Has patient had a PCN reaction causing immediate rash, facial/tongue/throat swelling, SOB or lightheadedness with hypotension: Unsure Has patient had a PCN reaction causing severe rash involving mucus membranes or skin necrosis: Unsure Has patient had a PCN reaction that required hospitalization Unsure Has patient had a PCN reaction occurring within the last 10 years: No If all of the above answers are "NO", then may proceed with Cephalosporin use. Childhood reaction unknown    Family History  Problem Relation Age of Onset  . Scoliosis Mother   . Other Neg Hx    Social History:  reports that she has never smoked. She has never used smokeless tobacco. She reports previous alcohol use. She reports that she does not use drugs.  Review of Systems  All other systems reviewed and are negative.   Physical Exam:  Vital signs in last 24 hours: Temp:  [98.3 F (36.8 C)] 98.3 F (36.8 C) (07/06 1316) Pulse Rate:  [66] 66 (07/06 1316) Resp:  [16] 16 (07/06 1316) BP: (152)/(99) 152/99 (07/06 1316) SpO2:  [100 %] 100 % (07/06 1316) Weight:  [70.6 kg] 70.6 kg (07/06 1316) Physical Exam  Constitutional: She is oriented to person,  place, and time. She appears well-developed and well-nourished.  HENT:  Head: Normocephalic and atraumatic.  Eyes: Pupils are equal, round, and reactive to light. EOM are normal.  Neck: Normal range of motion. No thyromegaly present.  Cardiovascular: Normal rate and regular rhythm.  Respiratory: Effort normal. No respiratory distress.  GI: Soft. She exhibits no distension.  Musculoskeletal: Normal range of motion.        General: No edema.  Neurological: She is alert and oriented to person, place, and time.  Skin: Skin is warm and  dry.  Psychiatric: She has a normal mood and affect. Her behavior is normal. Judgment and thought content normal.    Laboratory Data:  Results for orders placed or performed during the hospital encounter of 08/18/18 (from the past 24 hour(s))  Pregnancy, urine POC     Status: None   Collection Time: 08/18/18  1:40 PM  Result Value Ref Range   Preg Test, Ur NEGATIVE NEGATIVE   Recent Results (from the past 240 hour(s))  SARS Coronavirus 2 (Performed in [facility] hospital lab)     Status: None   Collection Time: 08/14/18 10:20 AM   Specimen: Nasal Swab  Result Value Ref Range Status   SARS Coronavirus 2 NEGATIVE NEGATIVE Final    Comment: (NOTE) SARS-CoV-2 target nucleic acids are NOT DETECTED. The SARS-CoV-2 RNA is generally detectable in upper and lower respiratory specimens during the acute phase of infection. Negative results do not preclude SARS-CoV-2 infection, do not rule out co-infections with other pathogens, and should not be used as the sole basis for treatment or other patient management decisions. Negative results must be combined with clinical observations, patient history, and epidemiological information. The expected result is Negative. Fact Sheet for Patients: SugarRoll.be Fact Sheet for Healthcare Providers: https://www.woods-mathews.com/ This test is not yet approved or cleared by the [place] FDA and  has been authorized for detection and/or diagnosis of SARS-CoV-2 by FDA under an Emergency Use Authorization (EUA). This EUA will remain  in effect (meaning this test can be used) for the duration of the COVID-19 declaration under Section 56 4(b)(1) of the Act, 21 U.S.C. section 360bbb-3(b)(1), unless the authorization is terminated or revoked sooner. Performed at [facility], [place] [address], [place], [place] [address]    Creatinine: No results for input(s): CREATININE in the last 168 hours. Baseline  Creatinine: unknown  Impression/Assessment:  33 yo with neurogenic bladder  Plan:  The risks/benefits/alternatives to intravesical botox was explained to the patient and she understands and wishes to proceed with surgery  Nicolette Bang 08/18/2018, 2:45 PM

## 2018-08-18 NOTE — Anesthesia Procedure Notes (Signed)
Procedure Name: LMA Insertion Date/Time: 08/18/2018 3:01 PM Performed by: Raenette Rover, CRNA Pre-anesthesia Checklist: Patient identified, Emergency Drugs available, Suction available and Patient being monitored Patient Re-evaluated:Patient Re-evaluated prior to induction Oxygen Delivery Method: Circle system utilized Preoxygenation: Pre-oxygenation with 100% oxygen Induction Type: IV induction Ventilation: Mask ventilation without difficulty LMA: LMA inserted LMA Size: 4.0 Number of attempts: 1 Placement Confirmation: positive ETCO2,  CO2 detector and breath sounds checked- equal and bilateral Tube secured with: Tape Dental Injury: Teeth and Oropharynx as per pre-operative assessment

## 2018-08-18 NOTE — Op Note (Signed)
Preoperative diagnosis: neurogenic bladder  Postoperative diagnosis: same  Procedure: 1 cystoscopy 2. Intravesical botox injection 200 units  Attending: Nicolette Bang  Anesthesia: General  Estimated blood loss: Minimal  Drains: none  Specimens: none  Antibiotics: ancef  Findings:  Ureteral orifices in normal anatomic location.  No masses/lesions in the bladder  Indications: Patient is a 33 year old female with a history of neurogenic bladder and urinary leakage refractory to anticholinergic therapy.  After discussing treatment options, they decided proceed with intravesical botox injection.  Procedure her in detail: The patient was brought to the operating room and a brief timeout was done to ensure correct patient, correct procedure, correct site.  General anesthesia was administered patient was placed in dorsal lithotomy position.  Their genitalia was then prepped and draped in usual sterile fashion.  A rigid 24 French cystoscope was passed in the urethra and the bladder.  Bladder was inspected and we noted no masses or lesions.  the ureteral orifices were in the normal orthotopic locations. Using a deflux injection needle we proceed to inject 200 units in a grid pattern between the ureteral orifices starting at the trigone to the mid posterior wall. We injected a total of 10 sites with 10 units per injection. The bladder was then drained and this concluded the procedure which was well tolerated by patient.  Complications: None  Condition: Stable, extubated, transferred to PACU  Plan: Patient is to be discharge home. They will followup in 1 month

## 2018-08-18 NOTE — Anesthesia Preprocedure Evaluation (Signed)
Anesthesia Evaluation  Patient identified by MRN, date of birth, ID band Patient awake    Reviewed: Allergy & Precautions, NPO status , Patient's Chart, lab work & pertinent test results  Airway Mallampati: II       Dental  (+) Dental Advisory Given   Pulmonary neg pulmonary ROS,    breath sounds clear to auscultation       Cardiovascular negative cardio ROS   Rhythm:Regular Rate:Normal     Neuro/Psych Spina bifida with neurogenic bladder     GI/Hepatic negative GI ROS, Neg liver ROS,   Endo/Other  negative endocrine ROS  Renal/GU negative Renal ROS     Musculoskeletal   Abdominal   Peds  Hematology negative hematology ROS (+)   Anesthesia Other Findings   Reproductive/Obstetrics                             Anesthesia Physical Anesthesia Plan  ASA: II  Anesthesia Plan: General   Post-op Pain Management:    Induction: Intravenous  PONV Risk Score and Plan: 3 and Dexamethasone, Ondansetron and Treatment may vary due to age or medical condition  Airway Management Planned: LMA  Additional Equipment: None  Intra-op Plan:   Post-operative Plan:   Informed Consent: I have reviewed the patients History and Physical, chart, labs and discussed the procedure including the risks, benefits and alternatives for the proposed anesthesia with the patient or authorized representative who has indicated his/her understanding and acceptance.     Dental advisory given  Plan Discussed with: CRNA  Anesthesia Plan Comments:         Anesthesia Quick Evaluation

## 2018-08-18 NOTE — Discharge Instructions (Signed)
Botulinum Toxin Bladder Injection  A botulinum toxin bladder injection is a procedure to treat an overactive bladder. During the procedure, a drug called botulinum toxin is injected into the bladder through a long, thin needle. This drug relaxes the bladder muscles and reduces overactivity. You may need this procedure if your medicines are not working or you cannot take them. The procedure may be repeated as needed. The treatment usually lasts for 6 months. Your health care provider will monitor you to see how well you respond. Tell a health care provider about:  Any allergies you have.  All medicines you are taking, including vitamins, herbs, eye drops, creams, and over-the-counter medicines.  Any problems you or family members have had with anesthetic medicines.  Any blood disorders you have.  Any surgeries you have had.  Any medical conditions you have.  Any previous reactions to a botulinum toxin injection.  Any symptoms of urinary tract infection. These include chills, fever, a burning feeling when passing urine, and needing to pass urine often.  Whether you are pregnant or may be pregnant. What are the risks? Generally this is a safe procedure. However, problems may occur, including:  Not being able to pass urine. If this happens, you may need to have your bladder emptied with a thin tube inserted into your urethra (urinary catheter).  Bleeding.  Urinary tract infection.  Allergic reaction to the botulinum toxin.  Pain or burning when passing urine.  Damage to other structures or organs. What happens before the procedure? Staying hydrated Follow instructions from your health care provider about hydration, which may include:  Up to 2 hours before the procedure - you may continue to drink clear liquids, such as water, clear fruit juice, black coffee, and plain tea. Eating and drinking restrictions Follow instructions from your health care provider about eating and  drinking, which may include:  8 hours before the procedure - stop eating heavy meals or foods, such as meat, fried foods, or fatty foods.  6 hours before the procedure - stop eating light meals or foods, such as toast or cereal.  6 hours before the procedure - stop drinking milk or drinks that contain milk.  2 hours before the procedure - stop drinking clear liquids. Medicines Ask your health care provider about:  Changing or stopping your regular medicines. This is especially important if you are taking diabetes medicines or blood thinners.  Taking medicines such as aspirin and ibuprofen. These medicines can thin your blood. Do not take these medicines unless your health care provider tells you to take them.  Taking over-the-counter medicines, vitamins, herbs, and supplements. General instructions  Plan to have someone take you home from the hospital or clinic.  If you will be going home right after the procedure, plan to have someone with you for 24 hours.  Ask your health care provider what steps will be taken to help prevent infection. These may include: ? Removing hair at the procedure site. ? Washing skin with a germ-killing soap. ? Antibiotic medicine. What happens during the procedure?   You will be asked to empty your bladder.  An IV will be inserted into one of your veins.  You will be given one or more of the following: ? A medicine to help you relax (sedative). ? A medicine to numb the area (local anesthetic). ? A medicine to make you fall asleep (general anesthetic).  A long, thin scope called a cystoscope will be passed into your bladder through the part  of the body that carries urine from your bladder (urethra).  The cystoscope will be used to fill your bladder with water.  A long needle will be passed through the cystoscope and into the bladder.  The botulinum toxin will be injected into your bladder. It may be injected into multiple areas of your  bladder.  Your bladder will be emptied, and the cystoscope will be removed. The procedure may vary among health care providers and hospitals. What can I expect after procedure? After your procedure, it is common to have:  Blood-tinged urine.  Burning or soreness when you pass urine. Follow these instructions at home: Medicines  Take over-the-counter and prescription medicines only as told by your health care provider.  If you were prescribed an antibiotic medicine, take it as told by your health care provider. Do not stop taking the antibiotic even if you start to feel better. General instructions   Do not drive for 24 hours if you were given a sedative during your procedure.  Drink enough fluid to keep your urine pale yellow.  Return to your normal activities as told by your health care provider. Ask your health care provider what activities are safe for you.  Keep all follow-up visits as told by your health care provider. This is important. Contact a health care provider if you have:  A fever or chills.  Blood-tinged urine for more than one day after your procedure.  Worsening pain or burning when you pass urine.  Pain or burning when passing urine for more than two days after your procedure.  Trouble emptying your bladder. Get help right away if you:  Have bright red blood in your urine.  Are unable to pass urine. Summary  A botulinum toxin bladder injection is a procedure to treat an overactive bladder.  This is generally a very safe procedure. However, problems may occur, including not being able to pass urine, bleeding, infection, pain, and allergic reactions to medicines.  You will be told when to stop eating and drinking, and what medicines to change or stop. Follow instructions carefully.  After the procedure, it is common to have blood in urine and to have soreness or burning when passing urine.  Contact a health care provider if you have a fever, have  blood in urine for more than a few days, or have trouble passing urine. Get help right away if you have bright red blood in the urine, or if you are unable to pass urine. This information is not intended to replace advice given to you by your health care provider. Make sure you discuss any questions you have with your health care provider. Document Released: 10/20/2014 Document Revised: 08/09/2017 Document Reviewed: 08/09/2017 Elsevier Patient Education  [place] Instructions  Activity: Get plenty of rest for the remainder of the day. A responsible individual must stay with you for 24 hours following the procedure.  For the next 24 hours, DO NOT: -Drive a car -Paediatric nurse -Drink alcoholic beverages -Take any medication unless instructed by your physician -Make any legal decisions or sign important papers.  Meals: Start with liquid foods such as gelatin or soup. Progress to regular foods as tolerated. Avoid greasy, spicy, heavy foods. If nausea and/or vomiting occur, drink only clear liquids until the nausea and/or vomiting subsides. Call your physician if vomiting continues.  Special Instructions/Symptoms: Your throat may feel dry or sore from the anesthesia or the breathing tube placed in your throat  during surgery. If this causes discomfort, gargle with warm salt water. The discomfort should disappear within 24 hours.  If you had a scopolamine patch placed behind your ear for the management of post- operative nausea and/or vomiting:  1. The medication in the patch is effective for 72 hours, after which it should be removed.  Wrap patch in a tissue and discard in the trash. Wash hands thoroughly with soap and water. 2. You may remove the patch earlier than 72 hours if you experience unpleasant side effects which may include dry mouth, dizziness or visual disturbances. 3. Avoid touching the patch. Wash your hands with soap and water after  contact with the patch.

## 2018-08-18 NOTE — Transfer of Care (Signed)
Immediate Anesthesia Transfer of Care Note  Patient: Hayley Calhoun  Procedure(s) Performed: CYSTOSCOPY (N/A ) BOTOX INJECTION (N/A )  Patient Location: PACU  Anesthesia Type:General  Level of Consciousness: drowsy and patient cooperative  Airway & Oxygen Therapy: Patient Spontanous Breathing and Patient connected to nasal cannula oxygen  Post-op Assessment: Report given to RN and Post -op Vital signs reviewed and stable  Post vital signs: Reviewed and stable  Last Vitals:  Vitals Value Taken Time  BP 124/82 08/18/18 1526  Temp    Pulse 66 08/18/18 1530  Resp 16 08/18/18 1530  SpO2 100 % 08/18/18 1530  Vitals shown include unvalidated device data.  Last Pain:  Vitals:   08/18/18 1316  TempSrc: Oral         Complications: No apparent anesthesia complications

## 2018-08-19 ENCOUNTER — Encounter (HOSPITAL_BASED_OUTPATIENT_CLINIC_OR_DEPARTMENT_OTHER): Payer: Self-pay | Admitting: Urology

## 2018-08-19 NOTE — Anesthesia Postprocedure Evaluation (Signed)
Anesthesia Post Note  Patient: Hayley Calhoun  Procedure(s) Performed: CYSTOSCOPY (N/A ) BOTOX INJECTION (N/A )     Patient location during evaluation: PACU Anesthesia Type: General Level of consciousness: awake and alert Pain management: pain level controlled Vital Signs Assessment: post-procedure vital signs reviewed and stable Respiratory status: spontaneous breathing, nonlabored ventilation, respiratory function stable and patient connected to nasal cannula oxygen Cardiovascular status: blood pressure returned to baseline and stable Postop Assessment: no apparent nausea or vomiting Anesthetic complications: no    Last Vitals:  Vitals:   08/18/18 1600 08/18/18 1650  BP: (!) 142/96 (!) 154/72  Pulse: (!) 59 73  Resp: 18 18  Temp:  36.8 C  SpO2: 100% 100%    Last Pain:  Vitals:   08/19/18 1133  TempSrc:   PainSc: 3                  Tiajuana Amass

## 2019-02-20 ENCOUNTER — Other Ambulatory Visit: Payer: Self-pay | Admitting: Urology

## 2019-02-23 ENCOUNTER — Other Ambulatory Visit: Payer: Self-pay | Admitting: Urology

## 2019-02-23 MED ORDER — ONABOTULINUMTOXINA 100 UNITS IJ SOLR: 200.0000 [IU] | Freq: Once | INTRAMUSCULAR | Status: AC

## 2019-03-12 ENCOUNTER — Other Ambulatory Visit: Payer: Self-pay | Admitting: Urology

## 2019-03-27 ENCOUNTER — Other Ambulatory Visit: Payer: Self-pay

## 2019-03-27 ENCOUNTER — Encounter (HOSPITAL_BASED_OUTPATIENT_CLINIC_OR_DEPARTMENT_OTHER): Payer: Self-pay | Admitting: Urology

## 2019-03-27 NOTE — Progress Notes (Addendum)
Spoke w/ via phone for pre-op interview---Hayley Calhoun needs dos----urine poct, I stat 8, ekg              Calhoun results------ COVID test ------03-03-2019 (due to work schedule) Arrive at -------1245 pm 04-06-2019 NPO after ------midnight food, clear liquids from midnight until 825 am then npo Medications to take morning of surgery -----none Diabetic medication -----n/a Patient Special Instructions -----patient removing belly button piericing for surgery, patient stated nose piercing taped for 2020 surgery at [place]. Pre-Op special Istructions ----- Patient verbalized understanding of instructions that were given at this phone interview. Patient denies shortness of breath, chest pain, fever, cough a this phone interview.

## 2019-04-03 ENCOUNTER — Other Ambulatory Visit (HOSPITAL_COMMUNITY)
Admission: RE | Admit: 2019-04-03 | Discharge: 2019-04-03 | Disposition: A | Discharge status: Home / Self Care | Payer: BC Managed Care – PPO | Source: Ambulatory Visit | Attending: Urology | Admitting: Urology

## 2019-04-03 ENCOUNTER — Other Ambulatory Visit: Payer: Self-pay

## 2019-04-03 DIAGNOSIS — Z01812 Encounter for preprocedural laboratory examination: Secondary | ICD-10-CM | POA: Diagnosis not present

## 2019-04-03 DIAGNOSIS — Z20822 Contact with and (suspected) exposure to covid-19: Secondary | ICD-10-CM | POA: Diagnosis not present

## 2019-04-03 LAB — SARS CORONAVIRUS 2 (TAT 6-24 HRS): SARS Coronavirus 2: NEGATIVE

## 2019-04-06 ENCOUNTER — Ambulatory Visit (HOSPITAL_BASED_OUTPATIENT_CLINIC_OR_DEPARTMENT_OTHER): Payer: BC Managed Care – PPO | Admitting: Anesthesiology

## 2019-04-06 ENCOUNTER — Other Ambulatory Visit: Payer: Self-pay

## 2019-04-06 ENCOUNTER — Encounter (HOSPITAL_BASED_OUTPATIENT_CLINIC_OR_DEPARTMENT_OTHER): Payer: Self-pay | Admitting: Urology

## 2019-04-06 ENCOUNTER — Encounter (HOSPITAL_BASED_OUTPATIENT_CLINIC_OR_DEPARTMENT_OTHER)
Admission: RE | Disposition: A | Discharge status: Home / Self Care | Payer: Self-pay | Source: Home / Self Care | Attending: Urology

## 2019-04-06 ENCOUNTER — Ambulatory Visit (HOSPITAL_BASED_OUTPATIENT_CLINIC_OR_DEPARTMENT_OTHER)
Admission: RE | Admit: 2019-04-06 | Discharge: 2019-04-06 | Disposition: A | Discharge status: Home / Self Care | Payer: BC Managed Care – PPO | Attending: Urology | Admitting: Urology

## 2019-04-06 DIAGNOSIS — R531 Weakness: Secondary | ICD-10-CM | POA: Diagnosis not present

## 2019-04-06 DIAGNOSIS — Z8269 Family history of other diseases of the musculoskeletal system and connective tissue: Secondary | ICD-10-CM | POA: Diagnosis not present

## 2019-04-06 DIAGNOSIS — Z880 Allergy status to penicillin: Secondary | ICD-10-CM | POA: Insufficient documentation

## 2019-04-06 DIAGNOSIS — Q059 Spina bifida, unspecified: Secondary | ICD-10-CM | POA: Insufficient documentation

## 2019-04-06 DIAGNOSIS — Z87442 Personal history of urinary calculi: Secondary | ICD-10-CM | POA: Insufficient documentation

## 2019-04-06 DIAGNOSIS — N39498 Other specified urinary incontinence: Secondary | ICD-10-CM | POA: Diagnosis not present

## 2019-04-06 DIAGNOSIS — Z882 Allergy status to sulfonamides status: Secondary | ICD-10-CM | POA: Diagnosis not present

## 2019-04-06 DIAGNOSIS — N319 Neuromuscular dysfunction of bladder, unspecified: Secondary | ICD-10-CM | POA: Insufficient documentation

## 2019-04-06 DIAGNOSIS — I10 Essential (primary) hypertension: Secondary | ICD-10-CM | POA: Diagnosis not present

## 2019-04-06 DIAGNOSIS — Z881 Allergy status to other antibiotic agents status: Secondary | ICD-10-CM | POA: Diagnosis not present

## 2019-04-06 HISTORY — DX: Urinary tract infection, site not specified: N39.0

## 2019-04-06 HISTORY — PX: CYSTOSCOPY: SHX5120

## 2019-04-06 HISTORY — DX: Other injury of unspecified body region, initial encounter: T14.8XXA

## 2019-04-06 HISTORY — PX: BOTOX INJECTION: SHX5754

## 2019-04-06 LAB — POCT I-STAT, CHEM 8
BUN: 8 mg/dL (ref 6–20)
Calcium, Ion: 1.23 mmol/L (ref 1.15–1.40)
Chloride: 104 mmol/L (ref 98–111)
Creatinine, Ser: 0.5 mg/dL (ref 0.44–1.00)
Glucose, Bld: 84 mg/dL (ref 70–99)
HCT: 39 % (ref 36.0–46.0)
Hemoglobin: 13.3 g/dL (ref 12.0–15.0)
Potassium: 3.5 mmol/L (ref 3.5–5.1)
Sodium: 140 mmol/L (ref 135–145)
TCO2: 25 mmol/L (ref 22–32)

## 2019-04-06 LAB — POCT PREGNANCY, URINE: Preg Test, Ur: NEGATIVE

## 2019-04-06 SURGERY — CYSTOSCOPY
Anesthesia: General | Site: Bladder

## 2019-04-06 MED ORDER — ONDANSETRON HCL 4 MG/2ML IJ SOLN
INTRAMUSCULAR | Status: AC
  Filled 2019-04-06: qty 2

## 2019-04-06 MED ORDER — PROPOFOL 10 MG/ML IV BOLUS
INTRAVENOUS | Status: AC
  Filled 2019-04-06: qty 20

## 2019-04-06 MED ORDER — GENTAMICIN SULFATE 40 MG/ML IJ SOLN
5.0000 mg/kg | Freq: Once | INTRAVENOUS | Status: AC
  Administered 2019-04-06: 15:00:00 349.5 mg via INTRAVENOUS
  Filled 2019-04-06: qty 8.75

## 2019-04-06 MED ORDER — FENTANYL CITRATE (PF) 100 MCG/2ML IJ SOLN
25.0000 ug | INTRAMUSCULAR | Status: DC | PRN
  Filled 2019-04-06: qty 1

## 2019-04-06 MED ORDER — PROPOFOL 10 MG/ML IV BOLUS
INTRAVENOUS | Status: DC | PRN
  Administered 2019-04-06: 30 mg via INTRAVENOUS
  Administered 2019-04-06: 170 mg via INTRAVENOUS

## 2019-04-06 MED ORDER — CEFAZOLIN SODIUM-DEXTROSE 2-4 GM/100ML-% IV SOLN
2.0000 g | INTRAVENOUS | Status: AC
  Administered 2019-04-06: 2 g via INTRAVENOUS
  Filled 2019-04-06: qty 100

## 2019-04-06 MED ORDER — DEXAMETHASONE SODIUM PHOSPHATE 10 MG/ML IJ SOLN
INTRAMUSCULAR | Status: AC
  Filled 2019-04-06: qty 1

## 2019-04-06 MED ORDER — FENTANYL CITRATE (PF) 100 MCG/2ML IJ SOLN
INTRAMUSCULAR | Status: DC | PRN
  Administered 2019-04-06: 50 ug via INTRAVENOUS

## 2019-04-06 MED ORDER — FENTANYL CITRATE (PF) 100 MCG/2ML IJ SOLN
INTRAMUSCULAR | Status: AC
  Filled 2019-04-06: qty 2

## 2019-04-06 MED ORDER — LIDOCAINE 2% (20 MG/ML) 5 ML SYRINGE
INTRAMUSCULAR | Status: DC | PRN
  Administered 2019-04-06: 80 mg via INTRAVENOUS

## 2019-04-06 MED ORDER — ONABOTULINUMTOXINA 100 UNITS IJ SOLR
INTRAMUSCULAR | Status: AC
  Filled 2019-04-06: qty 200

## 2019-04-06 MED ORDER — KETOROLAC TROMETHAMINE 30 MG/ML IJ SOLN
INTRAMUSCULAR | Status: AC
  Filled 2019-04-06: qty 1

## 2019-04-06 MED ORDER — HYDROCODONE-ACETAMINOPHEN 5-325 MG PO TABS: 1.0000 | ORAL_TABLET | ORAL | 0 refills | Status: DC | PRN

## 2019-04-06 MED ORDER — ONDANSETRON HCL 4 MG/2ML IJ SOLN
INTRAMUSCULAR | Status: DC | PRN
  Administered 2019-04-06: 4 mg via INTRAVENOUS

## 2019-04-06 MED ORDER — ACETAMINOPHEN 500 MG PO TABS
1000.0000 mg | ORAL_TABLET | Freq: Once | ORAL | Status: AC
  Administered 2019-04-06: 1000 mg via ORAL
  Filled 2019-04-06: qty 2

## 2019-04-06 MED ORDER — ACETAMINOPHEN 500 MG PO TABS
ORAL_TABLET | ORAL | Status: AC
  Filled 2019-04-06: qty 2

## 2019-04-06 MED ORDER — CEFAZOLIN SODIUM-DEXTROSE 2-4 GM/100ML-% IV SOLN
INTRAVENOUS | Status: AC
  Filled 2019-04-06: qty 100

## 2019-04-06 MED ORDER — LIDOCAINE 2% (20 MG/ML) 5 ML SYRINGE
INTRAMUSCULAR | Status: AC
  Filled 2019-04-06: qty 5

## 2019-04-06 MED ORDER — DEXAMETHASONE SODIUM PHOSPHATE 10 MG/ML IJ SOLN
INTRAMUSCULAR | Status: DC | PRN
  Administered 2019-04-06: 5 mg via INTRAVENOUS

## 2019-04-06 MED ORDER — LACTATED RINGERS IV SOLN
INTRAVENOUS | Status: DC
  Administered 2019-04-06: 50 mL/h via INTRAVENOUS
  Filled 2019-04-06: qty 1000

## 2019-04-06 MED ORDER — ONABOTULINUMTOXINA 100 UNITS IJ SOLR
INTRAMUSCULAR | Status: DC | PRN
  Administered 2019-04-06: 200 [IU] via INTRAMUSCULAR

## 2019-04-06 MED ORDER — KETOROLAC TROMETHAMINE 30 MG/ML IJ SOLN
INTRAMUSCULAR | Status: DC | PRN
  Administered 2019-04-06: 30 mg via INTRAVENOUS

## 2019-04-06 MED ORDER — SODIUM CHLORIDE (PF) 0.9 % IJ SOLN
INTRAMUSCULAR | Status: AC
  Filled 2019-04-06: qty 50

## 2019-04-06 MED ORDER — WATER FOR IRRIGATION, STERILE IR SOLN
Status: DC | PRN
  Administered 2019-04-06: 1000 mL via URETHRAL

## 2019-04-06 SURGICAL SUPPLY — 17 items
BAG DRAIN URO-CYSTO SKYTR STRL (DRAIN) ×3 IMPLANT
CATH ROBINSON RED A/P 16FR (CATHETERS) IMPLANT
CLOTH BEACON ORANGE TIMEOUT ST (SAFETY) ×3 IMPLANT
GLOVE BIO SURGEON STRL SZ8 (GLOVE) ×3 IMPLANT
GOWN STRL REUS W/ TWL XL LVL3 (GOWN DISPOSABLE) ×1 IMPLANT
GOWN STRL REUS W/TWL XL LVL3 (GOWN DISPOSABLE) ×2
KIT TURNOVER CYSTO (KITS) ×3 IMPLANT
MANIFOLD NEPTUNE II (INSTRUMENTS) ×3 IMPLANT
NDL SAFETY ECLIPSE 18X1.5 (NEEDLE) ×1 IMPLANT
NEEDLE ASPIRATION 22 (NEEDLE) ×3 IMPLANT
NEEDLE HYPO 18GX1.5 SHARP (NEEDLE) ×2
NS IRRIG 500ML POUR BTL (IV SOLUTION) IMPLANT
PACK CYSTO (CUSTOM PROCEDURE TRAY) ×3 IMPLANT
SYR 20ML LL LF (SYRINGE) ×3 IMPLANT
TUBE CONNECTING 12'X1/4 (SUCTIONS) ×1
TUBE CONNECTING 12X1/4 (SUCTIONS) ×2 IMPLANT
WATER STERILE IRR 3000ML UROMA (IV SOLUTION) ×3 IMPLANT

## 2019-04-06 NOTE — H&P (Signed)
Urology Admission H&P  Chief Complaint: urinary incontinence  History of Present Illness: Hayley Calhoun is a 34yo with a hx of neurogenic bladder here for intravesical botox injection. She underwent intravesical botox 6 months ago and has done well with minimal urinary incontinence. She catheterizes evvry 3-4 hours. No fevers/chills/sweast  Past Medical History:  Diagnosis Date  . Blister    left heel using triple antibiotic cream and bandaid q day, seeing family md virtual visit 03-27-2019 to make sure area healing well  . History of kidney stones   . Hypertension   . Neurogenic bladder   . Self-catheterizes urinary bladder    4 -5 times per day  . Spina bifida   . UTI (urinary tract infection)    on amoxicillian finsihes 03-27-2019  . Wears glasses    Past Surgical History:  Procedure Laterality Date  . BOTOX INJECTION N/A 08/18/2018   Procedure: BOTOX INJECTION;  Surgeon: Cleon Gustin, MD;  Location: [facility];  Service: Urology;  Laterality: N/A;  . CYSTOSCOPY N/A 08/18/2018   Procedure: CYSTOSCOPY;  Surgeon: Cleon Gustin, MD;  Location: [facility];  Service: Urology;  Laterality: N/A;  . CYSTOSCOPY W/ BOTOX INJECTION  LAST ONE 2016  . ENDOMETRIAL ABLATION  2018   per pt in gyn office  . EXTRACORPOREAL SHOCK WAVE LITHOTRIPSY  2011  approx  . INTERSTIM IMPLANT PLACEMENT  09/2012   @[facility]  in [place]  . INTERSTIM IMPLANT REMOVAL  05-02-2015   @ [facility] in [place]  . LAPAROSCOPIC TUBAL LIGATION Bilateral 07/25/2016   Procedure: LAPAROSCOPIC TUBAL LIGATION WITH FILSHIE CLIP;  Surgeon: Osborne Oman, MD;  Location: [facility];  Service: Gynecology;  Laterality: Bilateral;  . SPINE SURGERY  infant   closure of spinal defect (spina bifida)  . WISDOM TOOTH EXTRACTION      Home Medications:  Current Facility-Administered Medications  Medication Dose Route Frequency Provider Last Rate Last Admin  . ceFAZolin (ANCEF) IVPB 2g/100 mL premix  2 g  Intravenous 30 min Pre-Op Storie Heffern, Candee Furbish, MD      . gentamicin (GARAMYCIN) 350 mg in dextrose 5 % 100 mL IVPB  5 mg/kg Intravenous Once Yurika Pereda, Candee Furbish, MD      . lactated ringers infusion   Intravenous Continuous Pervis Hocking, DO 50 mL/hr at 04/06/19 1315 50 mL/hr at 04/06/19 1315   Facility-Administered Medications Ordered in Other Encounters  Medication Dose Route Frequency Provider Last Rate Last Admin  . botulinum toxin Type A (BOTOX) injection 200 Units  200 Units Intramuscular Once Cleon Gustin, MD      . botulinum toxin Type A (BOTOX) injection 200 Units  200 Units Intramuscular Once Magalene Mclear, Candee Furbish, MD       Allergies:  Allergies  Allergen Reactions  . Sulfa Antibiotics Shortness Of Breath and Itching  . Sulfamethizole Anaphylaxis  . Keflex [Cephalexin] Nausea And Vomiting  . Nitrofurantoin Monohyd Macro Nausea And Vomiting    Unable to tolerate higher dose than 50 mg per pt  . Penicillins Other (See Comments)    Reaction:  Unknown chilhood allergy Has patient had a PCN reaction causing immediate rash, facial/tongue/throat swelling, SOB or lightheadedness with hypotension: Unsure Has patient had a PCN reaction causing severe rash involving mucus membranes or skin necrosis: Unsure Has patient had a PCN reaction that required hospitalization Unsure Has patient had a PCN reaction occurring within the last 10 years: No If all of the above answers are "NO", then may proceed  with Cephalosporin use. Childhood rea    Family History  Problem Relation Age of Onset  . Scoliosis Mother   . Other Neg Hx    Social History:  reports that she has never smoked. She has never used smokeless tobacco. She reports previous alcohol use. She reports that she does not use drugs.  Review of Systems  All other systems reviewed and are negative.   Physical Exam:  Vital signs in last 24 hours: Temp:  [98.7 F (37.1 C)] 98.7 F (37.1 C) (02/22 1323) Pulse Rate:   [72] 72 (02/22 1323) Resp:  [18] 18 (02/22 1323) BP: (125)/(88) 125/88 (02/22 1323) SpO2:  [100 %] 100 % (02/22 1323) Weight:  [69.9 kg] 69.9 kg (02/22 1323) Physical Exam  Constitutional: She is oriented to person, place, and time. She appears well-developed and well-nourished.  HENT:  Head: Normocephalic and atraumatic.  Eyes: Pupils are equal, round, and reactive to light. EOM are normal.  Neck: No thyromegaly present.  Cardiovascular: Normal rate and regular rhythm.  Respiratory: Effort normal. No respiratory distress.  GI: Soft. She exhibits no distension.  Musculoskeletal:        General: No edema. Normal range of motion.     Cervical back: Normal range of motion.  Neurological: She is alert and oriented to person, place, and time.  Skin: Skin is warm and dry.  Psychiatric: She has a normal mood and affect. Her behavior is normal. Judgment and thought content normal.    Laboratory Data:  Results for orders placed or performed during the hospital encounter of 04/06/19 (from the past 24 hour(s))  Pregnancy, urine POC     Status: None   Collection Time: 04/06/19 12:53 PM  Result Value Ref Range   Preg Test, Ur NEGATIVE NEGATIVE  I-STAT, chem 8     Status: None   Collection Time: 04/06/19  1:14 PM  Result Value Ref Range   Sodium 140 135 - 145 mmol/L   Potassium 3.5 3.5 - 5.1 mmol/L   Chloride 104 98 - 111 mmol/L   BUN 8 6 - 20 mg/dL   Creatinine, Ser 0.50 0.44 - 1.00 mg/dL   Glucose, Bld 84 70 - 99 mg/dL   Calcium, Ion 1.23 1.15 - 1.40 mmol/L   TCO2 25 22 - 32 mmol/L   Hemoglobin 13.3 12.0 - 15.0 g/dL   HCT 39.0 36.0 - 46.0 %   Recent Results (from the past 240 hour(s))  SARS CORONAVIRUS 2 (TAT 6-24 HRS) Nasopharyngeal Nasopharyngeal Swab     Status: None   Collection Time: 04/03/19  9:13 AM   Specimen: Nasopharyngeal Swab  Result Value Ref Range Status   SARS Coronavirus 2 NEGATIVE NEGATIVE Final    Comment: (NOTE) SARS-CoV-2 target nucleic acids are NOT  DETECTED. The SARS-CoV-2 RNA is generally detectable in upper and lower respiratory specimens during the acute phase of infection. Negative results do not preclude SARS-CoV-2 infection, do not rule out co-infections with other pathogens, and should not be used as the sole basis for treatment or other patient management decisions. Negative results must be combined with clinical observations, patient history, and epidemiological information. The expected result is Negative. Fact Sheet for Patients: SugarRoll.be Fact Sheet for Healthcare Providers: https://www.woods-mathews.com/ This test is not yet approved or cleared by the [place] FDA and  has been authorized for detection and/or diagnosis of SARS-CoV-2 by FDA under an Emergency Use Authorization (EUA). This EUA will remain  in effect (meaning this test can be used) for  the duration of the COVID-19 declaration under Section 56 4(b)(1) of the Act, 21 U.S.C. section 360bbb-3(b)(1), unless the authorization is terminated or revoked sooner. Performed at [facility], [place] [address] [place], [place] [address]    Creatinine: Recent Labs    04/06/19 1314  CREATININE 0.50   Baseline Creatinine: 0.5  Impression/Assessment:  33yo with neurogenic bladder   Plan:  The risks/beneftis/alterantives to intravesical botox injection was explained to the patient and she understands and wishes to proceed with surgery  Nicolette Bang 04/06/2019, 2:02 PM

## 2019-04-06 NOTE — Anesthesia Preprocedure Evaluation (Addendum)
Anesthesia Evaluation  Patient identified by MRN, date of birth, ID band Patient awake    Airway Mallampati: II  TM Distance: >3 FB Neck ROM: Full    Dental  (+) Teeth Intact, Dental Advisory Given,    Pulmonary    breath sounds clear to auscultation       Cardiovascular hypertension,  Rhythm:Regular Rate:Normal     Neuro/Psych "some right leg weakness" Ambulates w/o any appliances Tethered spinal cord discovered at age 34    GI/Hepatic negative GI ROS, Neg liver ROS,   Endo/Other  negative endocrine ROS  Renal/GU negative Renal ROS   Neurogenic Bladder    Musculoskeletal   Abdominal   Peds  Hematology   Anesthesia Other Findings   Reproductive/Obstetrics                         Anesthesia Physical Anesthesia Plan  ASA: II  Anesthesia Plan: General   Post-op Pain Management:    Induction: Intravenous  PONV Risk Score and Plan:   Airway Management Planned:   Additional Equipment:   Intra-op Plan:   Post-operative Plan:   Informed Consent: I have reviewed the patients History and Physical, chart, labs and discussed the procedure including the risks, benefits and alternatives for the proposed anesthesia with the patient or authorized representative who has indicated his/her understanding and acceptance.     Dental advisory given  Plan Discussed with: Anesthesiologist and CRNA  Anesthesia Plan Comments:        Anesthesia Quick Evaluation

## 2019-04-06 NOTE — Transfer of Care (Signed)
Immediate Anesthesia Transfer of Care Note  Patient: Hayley Calhoun  Procedure(s) Performed: CYSTOSCOPY (N/A Bladder) BOTOX INJECTION (N/A Bladder)  Patient Location: PACU  Anesthesia Type:General  Level of Consciousness: drowsy and patient cooperative  Airway & Oxygen Therapy: Patient Spontanous Breathing and Patient connected to nasal cannula oxygen  Post-op Assessment: Report given to RN and Post -op Vital signs reviewed and stable  Post vital signs: Reviewed and stable  Last Vitals:  Vitals Value Taken Time  BP    Temp    Pulse 66 04/06/19 1505  Resp 16 04/06/19 1505  SpO2 100 % 04/06/19 1505  Vitals shown include unvalidated device data.  Last Pain:  Vitals:   04/06/19 1323  TempSrc: Oral  PainSc: 0-No pain      Patients Stated Pain Goal: 6 ([id number])  Complications: No apparent anesthesia complications

## 2019-04-06 NOTE — Anesthesia Postprocedure Evaluation (Signed)
Anesthesia Post Note  Patient: Hayley Calhoun  Procedure(s) Performed: CYSTOSCOPY (N/A Bladder) BOTOX INJECTION (N/A Bladder)     Patient location during evaluation: PACU Anesthesia Type: General Level of consciousness: awake Pain management: pain level controlled Vital Signs Assessment: post-procedure vital signs reviewed and stable Respiratory status: spontaneous breathing Cardiovascular status: stable Postop Assessment: no apparent nausea or vomiting Anesthetic complications: no    Last Vitals:  Vitals:   04/06/19 1505 04/06/19 1530  BP:  123/90  Pulse: 66 65  Resp: 17 14  Temp: 37 C   SpO2: 100% 100%    Last Pain:  Vitals:   04/06/19 1530  TempSrc:   PainSc: 0-No pain                 Hayley Calhoun

## 2019-04-06 NOTE — Discharge Instructions (Signed)
Botulinum Toxin Bladder Injection  A botulinum toxin bladder injection is a procedure to treat an overactive bladder. During the procedure, a drug called botulinum toxin is injected into the bladder through a long, thin needle. This drug relaxes the bladder muscles and reduces overactivity. You may need this procedure if your medicines are not working or you cannot take them. The procedure may be repeated as needed. The treatment usually lasts for 6 months. Your health care provider will monitor you to see how well you respond. Tell a health care provider about:  Any allergies you have.  All medicines you are taking, including vitamins, herbs, eye drops, creams, and over-the-counter medicines.  Any problems you or family members have had with anesthetic medicines.  Any blood disorders you have.  Any surgeries you have had.  Any medical conditions you have.  Any previous reactions to a botulinum toxin injection.  Any symptoms of urinary tract infection. These include chills, fever, a burning feeling when passing urine, and needing to pass urine often.  Whether you are pregnant or may be pregnant. What are the risks? Generally this is a safe procedure. However, problems may occur, including:  Not being able to pass urine. If this happens, you may need to have your bladder emptied with a thin tube inserted into your urethra (urinary catheter).  Bleeding.  Urinary tract infection.  Allergic reaction to the botulinum toxin.  Pain or burning when passing urine.  Damage to other structures or organs. What happens before the procedure? Staying hydrated Follow instructions from your health care provider about hydration, which may include:  Up to 2 hours before the procedure - you may continue to drink clear liquids, such as water, clear fruit juice, black coffee, and plain tea. Eating and drinking restrictions Follow instructions from your health care provider about eating and  drinking, which may include:  8 hours before the procedure - stop eating heavy meals or foods, such as meat, fried foods, or fatty foods.  6 hours before the procedure - stop eating light meals or foods, such as toast or cereal.  6 hours before the procedure - stop drinking milk or drinks that contain milk.  2 hours before the procedure - stop drinking clear liquids. Medicines Ask your health care provider about:  Changing or stopping your regular medicines. This is especially important if you are taking diabetes medicines or blood thinners.  Taking medicines such as aspirin and ibuprofen. These medicines can thin your blood. Do not take these medicines unless your health care provider tells you to take them.  Taking over-the-counter medicines, vitamins, herbs, and supplements. General instructions  Plan to have someone take you home from the hospital or clinic.  If you will be going home right after the procedure, plan to have someone with you for 24 hours.  Ask your health care provider what steps will be taken to help prevent infection. These may include: ? Removing hair at the procedure site. ? Washing skin with a germ-killing soap. ? Antibiotic medicine. What happens during the procedure?   You will be asked to empty your bladder.  An IV will be inserted into one of your veins.  You will be given one or more of the following: ? A medicine to help you relax (sedative). ? A medicine to numb the area (local anesthetic). ? A medicine to make you fall asleep (general anesthetic).  A long, thin scope called a cystoscope will be passed into your bladder through the part   of the body that carries urine from your bladder (urethra).  The cystoscope will be used to fill your bladder with water.  A long needle will be passed through the cystoscope and into the bladder.  The botulinum toxin will be injected into your bladder. It may be injected into multiple areas of your  bladder.  Your bladder will be emptied, and the cystoscope will be removed. The procedure may vary among health care providers and hospitals. What can I expect after procedure? After your procedure, it is common to have:   Blood-tinged urine.  Burning or soreness when you pass urine.   Follow these instructions at home: Medicines  Take over-the-counter and prescription medicines only as told by your health care provider.  If you were prescribed an antibiotic medicine, take it as told by your health care provider. Do not stop taking the antibiotic even if you start to feel better. General instructions   Do not drive for 24 hours if you were given a sedative during your procedure.  Drink enough fluid to keep your urine pale yellow.  Return to your normal activities as told by your health care provider. Ask your health care provider what activities are safe for you.  Keep all follow-up visits as told by your health care provider. This is important. Contact a health care provider if you have:  A fever or chills.  Blood-tinged urine for more than one day after your procedure.  Worsening pain or burning when you pass urine.  Pain or burning when passing urine for more than two days after your procedure.  Trouble emptying your bladder.   Get help right away if you:  Have bright red blood in your urine.  Are unable to pass urine. Summary  A botulinum toxin bladder injection is a procedure to treat an overactive bladder.  This is generally a very safe procedure. However, problems may occur, including not being able to pass urine, bleeding, infection, pain, and allergic reactions to medicines.  You will be told when to stop eating and drinking, and what medicines to change or stop. Follow instructions carefully.  After the procedure, it is common to have blood in urine and to have soreness or burning when passing urine.  Contact a health care provider if you have a  fever, have blood in urine for more than a few days, or have trouble passing urine. Get help right away if you have bright red blood in the urine, or if you are unable to pass urine. This information is not intended to replace advice given to you by your health care provider. Make sure you discuss any questions you have with your health care provider. Document Revised: 08/09/2017 Document Reviewed: 08/09/2017 Elsevier Patient Education  [place].    Avoid motrin,Aleve,advil until after 9 pm   Post Anesthesia Home Care Instructions  Activity: Get plenty of rest for the remainder of the day. A responsible individual must stay with you for 24 hours following the procedure.  For the next 24 hours, DO NOT: -Drive a car -Paediatric nurse -Drink alcoholic beverages -Take any medication unless instructed by your physician -Make any legal decisions or sign important papers.  Meals: Start with liquid foods such as gelatin or soup. Progress to regular foods as tolerated. Avoid greasy, spicy, heavy foods. If nausea and/or vomiting occur, drink only clear liquids until the nausea and/or vomiting subsides. Call your physician if vomiting continues.  Special Instructions/Symptoms: Your throat may feel dry or sore  from the anesthesia or the breathing tube placed in your throat during surgery. If this causes discomfort, gargle with warm salt water. The discomfort should disappear within 24 hours.  If you had a scopolamine patch placed behind your ear for the management of post- operative nausea and/or vomiting:  1. The medication in the patch is effective for 72 hours, after which it should be removed.  Wrap patch in a tissue and discard in the trash. Wash hands thoroughly with soap and water. 2. You may remove the patch earlier than 72 hours if you experience unpleasant side effects which may include dry mouth, dizziness or visual disturbances. 3. Avoid touching the patch. Wash your hands  with soap and water after contact with the patch.

## 2019-04-06 NOTE — Op Note (Signed)
Preoperative diagnosis: neurogenic bladder  Postoperative diagnosis: same  Procedure: 1 cystoscopy 2. Intravesical botox injection 200 units  Attending: Nicolette Bang  Anesthesia: General  Estimated blood loss: Minimal  Drains: none  Specimens: none  Antibiotics: ancef  Findings:  Ureteral orifices in normal anatomic location.  No masses/lesions in the bladder  Indications: Patient is a 34 year old female with a history of neurogenic bladder and urinary leakage refractory to anticholinergic therapy.  After discussing treatment options, they decided proceed with intravesical botox injection.  Procedure her in detail: The patient was brought to the operating room and a brief timeout was done to ensure correct patient, correct procedure, correct site.  General anesthesia was administered patient was placed in dorsal lithotomy position.  Their genitalia was then prepped and draped in usual sterile fashion.  A rigid 55 French cystoscope was passed in the urethra and the bladder.  Bladder was inspected and we noted no masses or lesions.  the ureteral orifices were in the normal orthotopic locations. Using a deflux injection needle we proceed to inject 200 units in a grid pattern between the ureteral orifices starting at the trigone to the mid posterior wall. We injected a total of 20 sites with 10 units per injection. The bladder was then drained and this concluded the procedure which was well tolerated by patient.  Complications: None  Condition: Stable, extubated, transferred to PACU  Plan: Patient is to be discharge home. They will followup in 1 month

## 2019-04-06 NOTE — Anesthesia Procedure Notes (Signed)
Procedure Name: LMA Insertion Date/Time: 04/06/2019 2:36 PM Performed by: Wanita Chamberlain, CRNA Pre-anesthesia Checklist: Patient identified, Timeout performed, Emergency Drugs available, Suction available and Patient being monitored Patient Re-evaluated:Patient Re-evaluated prior to induction Oxygen Delivery Method: Circle system utilized Preoxygenation: Pre-oxygenation with 100% oxygen Induction Type: IV induction Ventilation: Mask ventilation without difficulty LMA: LMA inserted LMA Size: 4.0 Number of attempts: 1 Placement Confirmation: breath sounds checked- equal and bilateral,  CO2 detector and positive ETCO2 Tube secured with: Tape Dental Injury: Teeth and Oropharynx as per pre-operative assessment

## 2019-04-07 NOTE — Addendum Note (Signed)
Addendum  created 04/07/19 0730 by Wanita Chamberlain, CRNA   Intraprocedure Event edited

## 2023-12-18 NOTE — ED Provider Notes (Signed)
 " [facility]  ED Provider Note  Hayley Calhoun 38 y.o. female DOB: [birth year] MRN: [id number] History   Chief Complaint  Patient presents with   Hematuria    Pt c/o blood in urine today.  Pt states she self caths, pt reports intermitted pelvic pain x 2 weeks and hx of recurrent UTIs; last treated for UTI 2 weeks ago   38 year old presenting to us  for hematuria.  Patient states that she self caths due to bladder complications from Botox  treatment.  This morning she noticed some blood in her urine which concerned her so she presented to us  for evaluation.  Notes that she has a history of UTIs and is followed by urology with [facility].  No other acute complaints at this time, no significant back or abdominal pain.  No history of kidney stones.  No fevers or chills.  Has an appointment to see her urologist sometime within the next month.   Hematuria      Past Medical History:  Diagnosis Date   Frequent UTI    Hypertension    Iron deficiency anemia due to chronic blood loss 02/11/2017   Kidney stone    Menorrhagia with regular cycle 02/11/2017   Neurogenic bladder    neurologic impairment to RLE L4-S1 (S2,3,4)   Spina bifida (*)    tethered spinal cord    Past Surgical History:  Procedure Laterality Date   Dilation and curettage of uterus  07/25/2017   Dr. Sharman   Endometrial ablation  07/25/2017   Dr. Sharman   Hysteroscopy  07/25/2017   Dr. Sharman   Interstim     implant and removal   Lithotripsy  2011   Mouth surgery     wisdom teeth   Spinal bifida  02/2010   cord detethering/laminectomy   Tubal ligation     Wisdom tooth extraction      Social History   Substance and Sexual Activity  Alcohol  Use Yes   Comment: occasional   Tobacco Use History[1] E-Cigarettes   Vaping Use Never User    Start Date     Cartridges/Day     Quit Date     Social History   Substance and Sexual Activity  Drug Use No          Allergies[2]  Home Medications   FLUTICASONE PROPIONATE (FLONASE) 50 MCG/ACTUATION NASAL SPRAY    one spray by Nasal route daily.   HYDROCHLOROTHIAZIDE  12.5 MG CAPSULE    TAKE 1 CAPSULE(12.5 MG) BY MOUTH DAILY    Primary Survey  Primary Survey  Review of Systems   Review of Systems  Constitutional: Negative.   HENT: Negative.    Respiratory: Negative.    Cardiovascular: Negative.   Gastrointestinal: Negative.   Genitourinary:  Positive for hematuria.  Musculoskeletal: Negative.     Physical Exam   ED Triage Vitals [12/18/23 0709]  BP (!) 154/104  Heart Rate 85  Resp 16  SpO2 97 %  Temp 98.8 F (37.1 C)    Physical Exam  Constitutional: She appears well-developed. She does not appear distressed and does not appear ill.  HENT:  Head: Normocephalic and atraumatic.  Eyes: EOM are intact.  Neck: Normal range of motion.  Cardiovascular: Normal rate, regular rhythm, normal heart sounds and intact distal pulses.  Pulmonary/Chest: Respiratory effort normal and breath sounds normal.  Abdominal: Soft. There is no abdominal tenderness. There is no guarding. Abdomen not distended.  Musculoskeletal: Normal range of motion.  Cervical back: Normal range of motion.   Neurological: She is alert and oriented to person, place, and time. Moves all extremities equally. Gait normal. She has normal speech. Cranial nerves intact II through XII.  Skin: Skin is warm. Skin is dry.     ED Course   Lab results:   URINALYSIS W/MICRO REFLEX CULTURE - SYMPTOMATIC - Abnormal      Result Value   Urine Color Colorless (*)    Urine Clarity Cloudy (*)    Urine Specific Gravity 1.010     Urine pH 5.5     Urine Protein - Dipstick Negative     Urine Glucose Negative     Urine Ketones Negative     Urine Bilirubin Negative     Urine Blood >1.0 (*)    Urine Nitrite Positive (*)    Urine Urobilinogen <2     Urine Leukocyte Esterase 250 (*)    Urine Squamous Epithelial Cells 0-2      Urine WBC 20-35 (*)    Urine RBC 50-75 (*)    Urine Bacteria 4+ (*)    UA Yeast 1+ (*)    Urine Budding Yeast Manual Present (*)    UA Microscopic Yes Micro (*)   HUMAN CHORIONIC GONADOTROPIN  (HCG), BETA-SUBUNIT, QUALITATIVE - Normal   Beta HCG Qual Negative    POCT HCG - Normal   POC HCG Negative     Lot Number [id number]     Expiration Date 11-28-24     INTERNAL QC CHECK PERFORMED Acceptable    CULTURE, URINE    Imaging: No data to display   ECG: ECG Results   None                                                                        Pre-Sedation Procedures  ED Course as of 12/18/23 0856  Aloha Lamer Autry's Documentation  Wed Dec 18, 2023  368 38 year old presenting to us  for hematuria.  H&P as above, nontoxic-appearing female with unremarkable vital signs.  Suspect likely combination of UTI/irritation from self cath as cause of her symptoms.  She is without flank or abdominal pain to suggest nephrolithiasis.  No fevers or chills to suggest pyelonephritis.  No abdominal TTP suggest intra-abdominal pathology such as pancreatitis, cholecystitis, appendicitis, diverticulitis.  9145 Urine today is concerning for UTI with nitrites, leukocytes, and significant bacteria without squamous epithelial cells.  Patient states ciprofloxacin  is normally the medication that works for UTI so she was given a course of Cipro  and recommended close outpatient follow-up.  She was discharged with strict return precautions.   Medical Decision Making Amount and/or Complexity of Data Reviewed Labs: ordered.  Risk Prescription drug management.          Provider Communication  New Prescriptions   CIPROFLOXACIN  (CIPRO ) 500 MG TABLET    Take one tablet (500 mg dose) by mouth every 12 (twelve) hours for 7 days.      Quantity: 14 tablet    Refills: 0    Modified Medications   No medications on file    Discontinued Medications   No  medications on file    Clinical Impression Final diagnoses:  Hematuria, unspecified type  Urinary tract infection with hematuria, site  unspecified    ED Disposition     ED Disposition  Discharge   Condition  Stable   Comment  --                   Electronically signed by:       [1] Social History Tobacco Use  Smoking Status Never   Passive exposure: Never  Smokeless Tobacco Never  [2] Allergies Allergen Reactions   Nitrofurantoin  Anaphylaxis, Nausea And Vomiting and Other    Difficulty breathing with subsequent ER visit  Unable to tolerate higher dose than 50 mg per pt  Difficulty breathing with subsequent ER visit  Unable to tolerate higher dose than 50 mg per pt  Difficulty breathing with subsequent ER visit, Unable to tolerate higher dose than 50 mg per pt   Penicillins Rash, Unknown, Itching and Other    Childhood reaction unknown  Reaction:  Unknown chilhood allergy  Has patient had a PCN reaction causing immediate rash, facial/tongue/throat swelling, SOB or lightheadedness with hypotension: Unsure  Has patient had a PCN reaction causing severe rash involving mucus membranes or skin necrosis: Unsure  Has patient had a PCN reaction that required hospitalization Unsure  Has patient had a PCN reaction occurring within the last 10 years: No  If all of the above answers are NO, then may proceed with Cephalosporin use.  Childhood rea  Reaction:  Unknown chilhood allergy Has patient had a PCN reaction causing immediate rash, facial/tongue/throat swelling, SOB or lightheadedness with hypotension: Unsure Has patient had a PCN reaction causing severe rash involving mucus membranes or skin necrosis: Unsure Has patient had a PCN reaction that required hospitalization Unsure Has patient had a PCN reaction occurring within the last 10 years: No If all of the above answers are NO, then may proceed with Cephalosporin use. Childhood rea    Childhood  reaction unknown Reaction:  Unknown chilhood allergy Has patient had a PCN reaction causing immediate rash, facial/tongue/throat swelling, SOB or lightheadedness with hypotension: Unsure Has patient had a PCN reaction causing severe rash involving mucus membranes or skin necrosis: Unsure Has patient had a PCN reaction that required hospitalization Unsure Has patient had a PCN reaction occurring within the last 10 years: No If all of the above answers are NO, then may proceed with Cephalosporin use. Childhood rea  Reaction:  Unknown chilhood allergy Has patient had a PCN reaction causing immediate rash, facial/tongue/throat swelling, SOB or lightheadedness with hypotension: Unsure Has patient had a PCN reaction causing severe rash involving mucus membranes or skin necrosis: Unsure Has patient had a PCN reaction that required hospitalization Unsure Has patient had a PCN reaction occurring within the last 10 years: No If all of the above answers are NO, then may proceed with Cephalosporin use. Childhood rea, , Ch   Sulfamethoxazole  Anaphylaxis   Keflex  Nausea And Vomiting   Nitrofurantoin  Monohyd Macro Nausea And Vomiting    Unable to tolerate higher dose than 50 mg per pt   Sulfa Antibiotics Unknown   Macrobid  [Macrodantin ] Nausea And Vomiting   Aloha Theo Furth, MD 12/18/23 [phone]  "

## 2024-03-15 ENCOUNTER — Emergency Department (HOSPITAL_BASED_OUTPATIENT_CLINIC_OR_DEPARTMENT_OTHER)

## 2024-03-15 ENCOUNTER — Other Ambulatory Visit: Payer: Self-pay

## 2024-03-15 ENCOUNTER — Emergency Department (HOSPITAL_BASED_OUTPATIENT_CLINIC_OR_DEPARTMENT_OTHER)
Admission: EM | Admit: 2024-03-15 | Discharge: 2024-03-15 | Disposition: A | Discharge status: Home / Self Care | Attending: Emergency Medicine | Admitting: Emergency Medicine

## 2024-03-15 DIAGNOSIS — I10 Essential (primary) hypertension: Secondary | ICD-10-CM | POA: Insufficient documentation

## 2024-03-15 DIAGNOSIS — N200 Calculus of kidney: Secondary | ICD-10-CM

## 2024-03-15 DIAGNOSIS — R000 Tachycardia, unspecified: Secondary | ICD-10-CM | POA: Insufficient documentation

## 2024-03-15 DIAGNOSIS — N132 Hydronephrosis with renal and ureteral calculous obstruction: Secondary | ICD-10-CM | POA: Insufficient documentation

## 2024-03-15 LAB — URINALYSIS, ROUTINE W REFLEX MICROSCOPIC
Bilirubin Urine: NEGATIVE
Glucose, UA: NEGATIVE mg/dL
Ketones, ur: NEGATIVE mg/dL
Nitrite: POSITIVE — AB
RBC / HPF: >50 RBC/hpf (ref 0–5)
Specific Gravity, Urine: 1.02 (ref 1.005–1.030)
pH: 8 (ref 5.0–8.0)

## 2024-03-15 LAB — COMPREHENSIVE METABOLIC PANEL WITH GFR
ALT: 41 U/L (ref 0–44)
AST: 35 U/L (ref 15–41)
Albumin: 4.5 g/dL (ref 3.5–5.0)
Alkaline Phosphatase: 57 U/L (ref 38–126)
Anion gap: 14 (ref 5–15)
BUN: 9 mg/dL (ref 6–20)
CO2: 27 mmol/L (ref 22–32)
Calcium: 9.9 mg/dL (ref 8.9–10.3)
Chloride: 103 mmol/L (ref 98–111)
Creatinine, Ser: 0.85 mg/dL (ref 0.44–1.00)
GFR, Estimated: >60 mL/min
Glucose, Bld: 114 mg/dL — ABNORMAL HIGH (ref 70–99)
Potassium: 3.2 mmol/L — ABNORMAL LOW (ref 3.5–5.1)
Sodium: 143 mmol/L (ref 135–145)
Total Bilirubin: 0.4 mg/dL (ref 0.0–1.2)
Total Protein: 7.6 g/dL (ref 6.5–8.1)

## 2024-03-15 LAB — CBC
HCT: 42.1 % (ref 36.0–46.0)
Hemoglobin: 15 g/dL (ref 12.0–15.0)
MCH: 27 pg (ref 26.0–34.0)
MCHC: 35.6 g/dL (ref 30.0–36.0)
MCV: 75.7 fL — ABNORMAL LOW (ref 80.0–100.0)
Platelets: 274 10*3/uL (ref 150–400)
RBC: 5.56 MIL/uL — ABNORMAL HIGH (ref 3.87–5.11)
RDW: 14.2 % (ref 11.5–15.5)
WBC: 3.7 10*3/uL — ABNORMAL LOW (ref 4.0–10.5)
nRBC: 0 % (ref 0.0–0.2)

## 2024-03-15 LAB — LIPASE, BLOOD: Lipase: 26 U/L (ref 11–51)

## 2024-03-15 LAB — PREGNANCY, URINE: Preg Test, Ur: NEGATIVE

## 2024-03-15 MED ORDER — TAMSULOSIN HCL 0.4 MG PO CAPS
0.4000 mg | ORAL_CAPSULE | Freq: Once | ORAL | Status: AC
  Administered 2024-03-15: 0.4 mg via ORAL
  Filled 2024-03-15: qty 1

## 2024-03-15 MED ORDER — METOCLOPRAMIDE HCL 5 MG/ML IJ SOLN
5.0000 mg | Freq: Once | INTRAMUSCULAR | Status: AC
  Administered 2024-03-15: 5 mg via INTRAVENOUS
  Filled 2024-03-15: qty 2

## 2024-03-15 MED ORDER — HYDROMORPHONE HCL 1 MG/ML IJ SOLN
0.5000 mg | Freq: Once | INTRAMUSCULAR | Status: AC
  Administered 2024-03-15: 0.5 mg via INTRAVENOUS
  Filled 2024-03-15: qty 1

## 2024-03-15 MED ORDER — TAMSULOSIN HCL 0.4 MG PO CAPS: 0.4000 mg | ORAL_CAPSULE | ORAL | 0 refills | Status: DC | PRN

## 2024-03-15 MED ORDER — KETOROLAC TROMETHAMINE 10 MG PO TABS: 10.0000 mg | ORAL_TABLET | Freq: Four times a day (QID) | ORAL | 0 refills | Status: DC | PRN

## 2024-03-15 MED ORDER — MORPHINE SULFATE (PF) 4 MG/ML IV SOLN
4.0000 mg | Freq: Once | INTRAVENOUS | Status: AC
  Administered 2024-03-15: 4 mg via INTRAVENOUS
  Filled 2024-03-15: qty 1

## 2024-03-15 MED ORDER — ONDANSETRON HCL 4 MG/2ML IJ SOLN
4.0000 mg | Freq: Once | INTRAMUSCULAR | Status: AC | PRN
  Administered 2024-03-15: 4 mg via INTRAVENOUS
  Filled 2024-03-15: qty 2

## 2024-03-15 NOTE — ED Triage Notes (Signed)
 Pt reports severe pelvic pain with sudden onset 2 hours ago, constant and progressive. Pt tearful in triage.  Pt h/o self-cath needs and completed a course of abx last week.

## 2024-03-15 NOTE — ED Notes (Signed)
 Pt reports nausea and is dry heaving in pt room.

## 2024-03-17 ENCOUNTER — Emergency Department (HOSPITAL_COMMUNITY)

## 2024-03-17 ENCOUNTER — Other Ambulatory Visit: Payer: Self-pay

## 2024-03-17 ENCOUNTER — Encounter (HOSPITAL_COMMUNITY): Payer: Self-pay

## 2024-03-17 ENCOUNTER — Inpatient Hospital Stay (HOSPITAL_COMMUNITY)
Admission: EM | Admit: 2024-03-17 | Discharge: 2024-03-19 | DRG: 871 | Disposition: A | Attending: Family Medicine | Admitting: Family Medicine

## 2024-03-17 DIAGNOSIS — B9689 Other specified bacterial agents as the cause of diseases classified elsewhere: Secondary | ICD-10-CM | POA: Diagnosis present

## 2024-03-17 DIAGNOSIS — N390 Urinary tract infection, site not specified: Secondary | ICD-10-CM | POA: Diagnosis present

## 2024-03-17 DIAGNOSIS — N319 Neuromuscular dysfunction of bladder, unspecified: Secondary | ICD-10-CM | POA: Diagnosis present

## 2024-03-17 DIAGNOSIS — Q059 Spina bifida, unspecified: Secondary | ICD-10-CM

## 2024-03-17 DIAGNOSIS — G43909 Migraine, unspecified, not intractable, without status migrainosus: Secondary | ICD-10-CM | POA: Diagnosis present

## 2024-03-17 DIAGNOSIS — E876 Hypokalemia: Secondary | ICD-10-CM | POA: Diagnosis present

## 2024-03-17 DIAGNOSIS — Z79899 Other long term (current) drug therapy: Secondary | ICD-10-CM

## 2024-03-17 DIAGNOSIS — I10 Essential (primary) hypertension: Secondary | ICD-10-CM | POA: Diagnosis present

## 2024-03-17 DIAGNOSIS — Z889 Allergy status to unspecified drugs, medicaments and biological substances status: Secondary | ICD-10-CM

## 2024-03-17 DIAGNOSIS — A4150 Gram-negative sepsis, unspecified: Principal | ICD-10-CM | POA: Diagnosis present

## 2024-03-17 DIAGNOSIS — B961 Klebsiella pneumoniae [K. pneumoniae] as the cause of diseases classified elsewhere: Secondary | ICD-10-CM | POA: Diagnosis present

## 2024-03-17 DIAGNOSIS — A419 Sepsis, unspecified organism: Secondary | ICD-10-CM | POA: Diagnosis present

## 2024-03-17 DIAGNOSIS — R310 Gross hematuria: Secondary | ICD-10-CM | POA: Diagnosis present

## 2024-03-17 DIAGNOSIS — N12 Tubulo-interstitial nephritis, not specified as acute or chronic: Principal | ICD-10-CM

## 2024-03-17 DIAGNOSIS — N200 Calculus of kidney: Secondary | ICD-10-CM | POA: Diagnosis present

## 2024-03-17 DIAGNOSIS — R339 Retention of urine, unspecified: Secondary | ICD-10-CM | POA: Diagnosis present

## 2024-03-17 DIAGNOSIS — J189 Pneumonia, unspecified organism: Secondary | ICD-10-CM | POA: Diagnosis present

## 2024-03-17 LAB — COMPREHENSIVE METABOLIC PANEL WITH GFR
ALT: 62 U/L — ABNORMAL HIGH (ref 0–44)
AST: 51 U/L — ABNORMAL HIGH (ref 15–41)
Albumin: 3.8 g/dL (ref 3.5–5.0)
Alkaline Phosphatase: 59 U/L (ref 38–126)
Anion gap: 10 (ref 5–15)
BUN: 8 mg/dL (ref 6–20)
CO2: 27 mmol/L (ref 22–32)
Calcium: 9.1 mg/dL (ref 8.9–10.3)
Chloride: 101 mmol/L (ref 98–111)
Creatinine, Ser: 0.78 mg/dL (ref 0.44–1.00)
GFR, Estimated: >60 mL/min
Glucose, Bld: 131 mg/dL — ABNORMAL HIGH (ref 70–99)
Potassium: 2.8 mmol/L — ABNORMAL LOW (ref 3.5–5.1)
Sodium: 137 mmol/L (ref 135–145)
Total Bilirubin: 0.9 mg/dL (ref 0.0–1.2)
Total Protein: 6.7 g/dL (ref 6.5–8.1)

## 2024-03-17 LAB — HCG, SERUM, QUALITATIVE: Preg, Serum: NEGATIVE

## 2024-03-17 LAB — CBC WITH DIFFERENTIAL/PLATELET
Abs Immature Granulocytes: 0.03 10*3/uL (ref 0.00–0.07)
Basophils Absolute: 0 10*3/uL (ref 0.0–0.1)
Basophils Relative: 0 %
Eosinophils Absolute: 0 10*3/uL (ref 0.0–0.5)
Eosinophils Relative: 0 %
HCT: 36.8 % (ref 36.0–46.0)
Hemoglobin: 12.9 g/dL (ref 12.0–15.0)
Immature Granulocytes: 0 %
Lymphocytes Relative: 4 %
Lymphs Abs: 0.4 10*3/uL — ABNORMAL LOW (ref 0.7–4.0)
MCH: 26.5 pg (ref 26.0–34.0)
MCHC: 35.1 g/dL (ref 30.0–36.0)
MCV: 75.6 fL — ABNORMAL LOW (ref 80.0–100.0)
Monocytes Absolute: 0.5 10*3/uL (ref 0.1–1.0)
Monocytes Relative: 5 %
Neutro Abs: 9 10*3/uL — ABNORMAL HIGH (ref 1.7–7.7)
Neutrophils Relative %: 91 %
Platelets: 226 10*3/uL (ref 150–400)
RBC: 4.87 MIL/uL (ref 3.87–5.11)
RDW: 14.1 % (ref 11.5–15.5)
WBC: 10 10*3/uL (ref 4.0–10.5)
nRBC: 0 % (ref 0.0–0.2)

## 2024-03-17 LAB — URINALYSIS, W/ REFLEX TO CULTURE (INFECTION SUSPECTED)
Bilirubin Urine: NEGATIVE
Glucose, UA: NEGATIVE mg/dL
Ketones, ur: NEGATIVE mg/dL
Nitrite: NEGATIVE
Protein, ur: 30 mg/dL — AB
Specific Gravity, Urine: 1.011 (ref 1.005–1.030)
pH: 6 (ref 5.0–8.0)

## 2024-03-17 LAB — PROTIME-INR
INR: 1.1 (ref 0.8–1.2)
Prothrombin Time: 15.2 s (ref 11.4–15.2)

## 2024-03-17 LAB — I-STAT CG4 LACTIC ACID, ED: Lactic Acid, Venous: 0.8 mmol/L (ref 0.5–1.9)

## 2024-03-17 LAB — RESP PANEL BY RT-PCR (RSV, FLU A&B, COVID)  RVPGX2
Influenza A by PCR: NEGATIVE
Influenza B by PCR: NEGATIVE
Resp Syncytial Virus by PCR: NEGATIVE
SARS Coronavirus 2 by RT PCR: NEGATIVE

## 2024-03-17 MED ORDER — SODIUM CHLORIDE 0.9 % IV SOLN
2.0000 g | Freq: Once | INTRAVENOUS | Status: AC
  Administered 2024-03-17: 2 g via INTRAVENOUS
  Filled 2024-03-17: qty 10

## 2024-03-17 MED ORDER — LACTATED RINGERS IV BOLUS (SEPSIS)
1000.0000 mL | Freq: Once | INTRAVENOUS | Status: AC
  Administered 2024-03-17: 1000 mL via INTRAVENOUS

## 2024-03-17 MED ORDER — ACETAMINOPHEN 325 MG PO TABS
650.0000 mg | ORAL_TABLET | Freq: Four times a day (QID) | ORAL | Status: DC | PRN
  Administered 2024-03-17 – 2024-03-18 (×4): 650 mg via ORAL
  Filled 2024-03-17 (×4): qty 2

## 2024-03-17 MED ORDER — ONDANSETRON HCL 4 MG/2ML IJ SOLN
4.0000 mg | Freq: Once | INTRAMUSCULAR | Status: AC
  Administered 2024-03-17: 4 mg via INTRAVENOUS
  Filled 2024-03-17: qty 2

## 2024-03-17 MED ORDER — ACETAMINOPHEN 650 MG RE SUPP: 650.0000 mg | Freq: Four times a day (QID) | RECTAL | Status: DC | PRN

## 2024-03-17 MED ORDER — TRAZODONE HCL 50 MG PO TABS: 25.0000 mg | ORAL_TABLET | Freq: Every evening | ORAL | Status: DC | PRN

## 2024-03-17 MED ORDER — SODIUM CHLORIDE 0.9 % IV SOLN
2.0000 g | Freq: Three times a day (TID) | INTRAVENOUS | Status: DC
  Administered 2024-03-17 – 2024-03-18 (×2): 2 g via INTRAVENOUS
  Filled 2024-03-17 (×2): qty 12.5

## 2024-03-17 MED ORDER — KETOROLAC TROMETHAMINE 15 MG/ML IJ SOLN
15.0000 mg | Freq: Once | INTRAMUSCULAR | Status: AC
  Administered 2024-03-17: 15 mg via INTRAVENOUS
  Filled 2024-03-17: qty 1

## 2024-03-17 MED ORDER — ONDANSETRON HCL 4 MG PO TABS: 4.0000 mg | ORAL_TABLET | Freq: Four times a day (QID) | ORAL | Status: DC | PRN

## 2024-03-17 MED ORDER — ONDANSETRON HCL 4 MG/2ML IJ SOLN
4.0000 mg | Freq: Four times a day (QID) | INTRAMUSCULAR | Status: DC | PRN
  Administered 2024-03-18 (×2): 4 mg via INTRAVENOUS
  Filled 2024-03-17 (×2): qty 2

## 2024-03-17 MED ORDER — LACTATED RINGERS IV BOLUS (SEPSIS): 1000.0000 mL | Freq: Once | INTRAVENOUS | Status: DC

## 2024-03-17 MED ORDER — HYDROCHLOROTHIAZIDE 12.5 MG PO TABS
12.5000 mg | ORAL_TABLET | Freq: Every day | ORAL | Status: DC
  Filled 2024-03-17: qty 1

## 2024-03-17 MED ORDER — TAMSULOSIN HCL 0.4 MG PO CAPS
0.4000 mg | ORAL_CAPSULE | Freq: Every day | ORAL | Status: DC
  Administered 2024-03-17 – 2024-03-19 (×3): 0.4 mg via ORAL
  Filled 2024-03-17 (×3): qty 1

## 2024-03-17 MED ORDER — POTASSIUM CHLORIDE 10 MEQ/100ML IV SOLN
10.0000 meq | INTRAVENOUS | Status: AC
  Administered 2024-03-17 (×2): 10 meq via INTRAVENOUS
  Filled 2024-03-17 (×2): qty 100

## 2024-03-17 MED ORDER — OXYCODONE HCL 5 MG PO TABS
5.0000 mg | ORAL_TABLET | ORAL | Status: DC | PRN
  Administered 2024-03-17 – 2024-03-18 (×2): 5 mg via ORAL
  Filled 2024-03-17 (×2): qty 1

## 2024-03-17 MED ORDER — LACTATED RINGERS IV BOLUS (SEPSIS)
500.0000 mL | Freq: Once | INTRAVENOUS | Status: AC
  Administered 2024-03-17: 500 mL via INTRAVENOUS

## 2024-03-17 MED ORDER — ALBUTEROL SULFATE (2.5 MG/3ML) 0.083% IN NEBU: 2.5000 mg | INHALATION_SOLUTION | RESPIRATORY_TRACT | Status: DC | PRN

## 2024-03-17 MED ORDER — LACTATED RINGERS IV SOLN: INTRAVENOUS | Status: AC

## 2024-03-17 NOTE — Sepsis Progress Note (Signed)
 Sepsis protocol monitored by eLink

## 2024-03-17 NOTE — Plan of Care (Signed)
   Problem: Education: Goal: Knowledge of General Education information will improve Description Including pain rating scale, medication(s)/side effects and non-pharmacologic comfort measures Outcome: Progressing   Problem: Health Behavior/Discharge Planning: Goal: Ability to manage health-related needs will improve Outcome: Progressing

## 2024-03-18 ENCOUNTER — Inpatient Hospital Stay (HOSPITAL_COMMUNITY)

## 2024-03-18 LAB — HIV ANTIBODY (ROUTINE TESTING W REFLEX): HIV Screen 4th Generation wRfx: NONREACTIVE

## 2024-03-18 LAB — BASIC METABOLIC PANEL WITH GFR
Anion gap: 6 (ref 5–15)
BUN: <5 mg/dL — ABNORMAL LOW (ref 6–20)
CO2: 27 mmol/L (ref 22–32)
Calcium: 7.7 mg/dL — ABNORMAL LOW (ref 8.9–10.3)
Chloride: 103 mmol/L (ref 98–111)
Creatinine, Ser: 0.62 mg/dL (ref 0.44–1.00)
GFR, Estimated: >60 mL/min
Glucose, Bld: 121 mg/dL — ABNORMAL HIGH (ref 70–99)
Potassium: 3.3 mmol/L — ABNORMAL LOW (ref 3.5–5.1)
Sodium: 136 mmol/L (ref 135–145)

## 2024-03-18 LAB — CBC
HCT: 31.5 % — ABNORMAL LOW (ref 36.0–46.0)
Hemoglobin: 10.8 g/dL — ABNORMAL LOW (ref 12.0–15.0)
MCH: 26.3 pg (ref 26.0–34.0)
MCHC: 34.3 g/dL (ref 30.0–36.0)
MCV: 76.6 fL — ABNORMAL LOW (ref 80.0–100.0)
Platelets: 185 10*3/uL (ref 150–400)
RBC: 4.11 MIL/uL (ref 3.87–5.11)
RDW: 14.2 % (ref 11.5–15.5)
WBC: 5.9 10*3/uL (ref 4.0–10.5)
nRBC: 0 % (ref 0.0–0.2)

## 2024-03-18 LAB — URINE CULTURE: Culture: >=100000 — AB

## 2024-03-18 LAB — MAGNESIUM: Magnesium: 1.7 mg/dL (ref 1.7–2.4)

## 2024-03-18 MED ORDER — KETOROLAC TROMETHAMINE 15 MG/ML IJ SOLN
15.0000 mg | Freq: Four times a day (QID) | INTRAMUSCULAR | Status: DC | PRN
  Administered 2024-03-18 – 2024-03-19 (×4): 15 mg via INTRAVENOUS
  Filled 2024-03-18 (×4): qty 1

## 2024-03-18 MED ORDER — POTASSIUM CHLORIDE CRYS ER 20 MEQ PO TBCR
40.0000 meq | EXTENDED_RELEASE_TABLET | Freq: Once | ORAL | Status: AC
  Administered 2024-03-18: 40 meq via ORAL
  Filled 2024-03-18: qty 2

## 2024-03-18 MED ORDER — HYDRALAZINE HCL 20 MG/ML IJ SOLN
5.0000 mg | Freq: Four times a day (QID) | INTRAMUSCULAR | Status: DC | PRN
  Administered 2024-03-18: 5 mg via INTRAVENOUS
  Filled 2024-03-18: qty 1

## 2024-03-18 MED ORDER — CEFAZOLIN SODIUM-DEXTROSE 2-4 GM/100ML-% IV SOLN
2.0000 g | Freq: Three times a day (TID) | INTRAVENOUS | Status: DC
  Administered 2024-03-18 – 2024-03-19 (×4): 2 g via INTRAVENOUS
  Filled 2024-03-18 (×4): qty 100

## 2024-03-18 MED ORDER — PROCHLORPERAZINE EDISYLATE 10 MG/2ML IJ SOLN
5.0000 mg | Freq: Four times a day (QID) | INTRAMUSCULAR | Status: DC | PRN
  Administered 2024-03-18: 5 mg via INTRAVENOUS
  Filled 2024-03-18: qty 2

## 2024-03-18 MED ORDER — ENOXAPARIN SODIUM 40 MG/0.4ML IJ SOSY
40.0000 mg | PREFILLED_SYRINGE | INTRAMUSCULAR | Status: DC
  Administered 2024-03-18: 40 mg via SUBCUTANEOUS
  Filled 2024-03-18: qty 0.4

## 2024-03-18 MED ORDER — SALINE SPRAY 0.65 % NA SOLN: 1.0000 | NASAL | Status: DC | PRN

## 2024-03-18 MED ORDER — LACTATED RINGERS IV SOLN: INTRAVENOUS | Status: AC

## 2024-03-18 NOTE — Progress Notes (Signed)
" °   03/18/24 1530  Assess: MEWS Score  Temp (!) 102.6 F (39.2 C)  Assess: MEWS Score  MEWS Temp 2  MEWS Systolic 0  MEWS Pulse 0  MEWS RR 0  MEWS LOC 0  MEWS Score 2  MEWS Score Color Yellow  Assess: if the MEWS score is Yellow or Red  Were vital signs accurate and taken at a resting state? Yes  Does the patient meet 2 or more of the SIRS criteria? No  MEWS guidelines implemented  Yes, yellow  Treat  MEWS Interventions Considered administering scheduled or prn medications/treatments as ordered  Take Vital Signs  Increase Vital Sign Frequency  Yellow: Q2hr x1, continue Q4hrs until patient remains green for 12hrs  Escalate  MEWS: Escalate Yellow: Discuss with charge nurse and consider notifying provider and/or RRT  Notify: Charge Nurse/RN  Name of Charge Nurse/RN Notified Christa  Provider Notification  Provider Name/Title Perri  Date Provider Notified 03/18/24  Time Provider Notified 1535  Method of Notification Page  Notification Reason Change in status  Provider response See new orders  Date of Provider Response 03/18/24  Time of Provider Response 1540  Assess: SIRS CRITERIA  SIRS Temperature  1  SIRS Respirations  0  SIRS Pulse 0  SIRS WBC 0  SIRS Score Sum  1    "

## 2024-03-18 NOTE — Plan of Care (Signed)
" °  Problem: Education: Goal: Knowledge of General Education information will improve Description: Including pain rating scale, medication(s)/side effects and non-pharmacologic comfort measures Outcome: Progressing   Problem: Clinical Measurements: Goal: Will remain free from infection Outcome: Progressing Goal: Diagnostic test results will improve Outcome: Progressing   Problem: Nutrition: Goal: Adequate nutrition will be maintained Outcome: Progressing   Problem: Coping: Goal: Level of anxiety will decrease Outcome: Progressing   Problem: Elimination: Goal: Will not experience complications related to bowel motility Outcome: Progressing Goal: Will not experience complications related to urinary retention Outcome: Progressing   Problem: Pain Managment: Goal: General experience of comfort will improve and/or be controlled Outcome: Progressing   Problem: Safety: Goal: Ability to remain free from injury will improve Outcome: Progressing   "

## 2024-03-18 NOTE — Progress Notes (Signed)
 " PROGRESS NOTE    Hayley Calhoun  FMW:[id number] DOB: 28-Jul-1985 DOA: 03/17/2024 PCP: Medicine, [facility] (Inactive)  Chief Complaint  Patient presents with   Flank Pain    Brief Narrative:   Hayley Calhoun is Chandan Fly 39 y.o. female with medical history significant for urinary retention and frequent UTI, hypertension being admitted to the hospital with sepsis due to pyelonephritis in the setting of nephrolithiasis.   Assessment & Plan:   Principal Problem:   Sepsis secondary to UTI (HCC)  Sepsis-meeting criteria with tachycardia, fever.  Source is pyelonephritis.  No leukocytosis, no evidence of endorgan dysfunction, lactate is normal. As below    Pyelonephritis Klebsiella Pneumoniae UTI Culture from 2/1 with klebsiella sensitive to ancef  Blood and urine cultures pending Narrow as tolerated   Nephrolithiasis-patient has Odarius Dines known left-sided kidney stone which was obstructing on 2/1, and has now progressed into the bladder. -Continue fluids, Flomax  -Pain and nausea control as needed   Migraine Analgesics  Lightheadedness Possibly related to hypovolemia IVF and follow orthostatics  Hypokalemia Replace and follow Thiazide contributing    Hypertension Hold thiazide for now   Urinary retention continue self-catheterization  Needs outpatient urology follow up, [facility]  Hx spina bifida     DVT prophylaxis: lovenox  Code Status: full Family Communication: husband and children at bedside Disposition:   Status is: Inpatient Remains inpatient appropriate because: need for continued inpatient care   Consultants:  none  Procedures:  none  Antimicrobials:  Anti-infectives (From admission, onward)    Start     Dose/Rate Route Frequency Ordered Stop   03/18/24 1400  ceFAZolin  (ANCEF ) IVPB 2g/100 mL premix        2 g 200 mL/hr over 30 Minutes Intravenous Every 8 hours 03/18/24 0844     03/17/24 2000  ceFEPIme  (MAXIPIME )  2 g in sodium chloride  0.9 % 100 mL IVPB  Status:  Discontinued        2 g 200 mL/hr over 30 Minutes Intravenous Every 8 hours 03/17/24 1440 03/18/24 0844   03/17/24 1045  aztreonam  (AZACTAM ) 2 g in sodium chloride  0.9 % 100 mL IVPB        2 g 200 mL/hr over 30 Minutes Intravenous  Once 03/17/24 1040 03/17/24 1131       Subjective: C/o fever and HA Migraine, right sided - photophobia  Objective: Vitals:   03/17/24 2007 03/18/24 0456 03/18/24 0630 03/18/24 1204  BP: 136/85 130/87  (!) 147/102  Pulse: 93 96  78  Resp: 18 19  16   Temp: 99.5 F (37.5 C) (!) 102.3 F (39.1 C) 98.7 F (37.1 C) 99.2 F (37.3 C)  TempSrc: Oral Oral Oral   SpO2:    100%  Weight:      Height:        Intake/Output Summary (Last 24 hours) at 03/18/2024 1513 Last data filed at 03/18/2024 0900 Gross per 24 hour  Intake 1080 ml  Output --  Net 1080 ml   Filed Weights   03/17/24 1016  Weight: 78.5 kg    Examination:  General exam: Appears calm and comfortable  Respiratory system: unlabored Cardiovascular system: RRR Gastrointestinal system: Abdomen is nondistended, soft and nontender. No CVA tenderness. Central nervous system: Alert and oriented. No focal neurological deficits. Extremities: no LEE   Data Reviewed: I have personally reviewed following labs and imaging studies  CBC: Recent Labs  Lab 03/15/24 1625 03/17/24 1045 03/18/24 0600  WBC 3.7* 10.0 5.9  NEUTROABS  --  9.0*  --   HGB 15.0 12.9 10.8*  HCT 42.1 36.8 31.5*  MCV 75.7* 75.6* 76.6*  PLT 274 226 185    Basic Metabolic Panel: Recent Labs  Lab 03/15/24 1625 03/17/24 1045 03/18/24 0600 03/18/24 0800  NA 143 137 136  --   K 3.2* 2.8* 3.3*  --   CL 103 101 103  --   CO2 27 27 27   --   GLUCOSE 114* 131* 121*  --   BUN 9 8 <5*  --   CREATININE 0.85 0.78 0.62  --   CALCIUM 9.9 9.1 7.7*  --   MG  --   --   --  1.7    GFR: Estimated Creatinine Clearance: 97.6 mL/min (by C-G formula based on SCr of 0.62  mg/dL).  Liver Function Tests: Recent Labs  Lab 03/15/24 1625 03/17/24 1045  AST 35 51*  ALT 41 62*  ALKPHOS 57 59  BILITOT 0.4 0.9  PROT 7.6 6.7  ALBUMIN 4.5 3.8    CBG: No results for input(s): GLUCAP in the last 168 hours.   Recent Results (from the past 240 hours)  Urine Culture     Status: Abnormal   Collection Time: 03/15/24  6:02 PM   Specimen: Urine, Clean Catch  Result Value Ref Range Status   Specimen Description   Final    URINE, CLEAN CATCH Performed at [facility], [address], [place], [place] [address]    Special Requests   Final    NONE Performed at [facility], [address], [place], [place] [address]    Culture >=100,000 COLONIES/mL KLEBSIELLA PNEUMONIAE (Bayler Gehrig)  Final   Report Status 03/18/2024 FINAL  Final   Organism ID, Bacteria KLEBSIELLA PNEUMONIAE (Hanif Radin)  Final      Susceptibility   Klebsiella pneumoniae - MIC*    AMPICILLIN >=32 RESISTANT Resistant     CEFAZOLIN  (URINE) Value in next row Sensitive      2 SENSITIVEThis is Demere Dotzler modified FDA-approved test that has been validated and its performance characteristics determined by the reporting laboratory.  This laboratory is certified under the Clinical Laboratory Improvement Amendments CLIA as qualified to perform high complexity clinical laboratory testing.    CEFEPIME  Value in next row Sensitive      2 SENSITIVEThis is Michael Ventresca modified FDA-approved test that has been validated and its performance characteristics determined by the reporting laboratory.  This laboratory is certified under the Clinical Laboratory Improvement Amendments CLIA as qualified to perform high complexity clinical laboratory testing.    ERTAPENEM Value in next row Sensitive      2 SENSITIVEThis is Shruti Arrey modified FDA-approved test that has been validated and its performance characteristics determined by the reporting laboratory.  This laboratory is certified under the Clinical Laboratory Improvement  Amendments CLIA as qualified to perform high complexity clinical laboratory testing.    CEFTRIAXONE  Value in next row Sensitive      2 SENSITIVEThis is Arshia Spellman modified FDA-approved test that has been validated and its performance characteristics determined by the reporting laboratory.  This laboratory is certified under the Clinical Laboratory Improvement Amendments CLIA as qualified to perform high complexity clinical laboratory testing.    CIPROFLOXACIN  Value in next row Resistant      2 SENSITIVEThis is Yvetta Drotar modified FDA-approved test that has been validated and its performance characteristics determined by the reporting laboratory.  This laboratory is certified under the Clinical Laboratory Improvement Amendments CLIA as qualified to perform high  complexity clinical laboratory testing.    GENTAMICIN  Value in next row Sensitive      2 SENSITIVEThis is Jairo Bellew modified FDA-approved test that has been validated and its performance characteristics determined by the reporting laboratory.  This laboratory is certified under the Clinical Laboratory Improvement Amendments CLIA as qualified to perform high complexity clinical laboratory testing.    NITROFURANTOIN  Value in next row Intermediate      2 SENSITIVEThis is Gil Ingwersen modified FDA-approved test that has been validated and its performance characteristics determined by the reporting laboratory.  This laboratory is certified under the Clinical Laboratory Improvement Amendments CLIA as qualified to perform high complexity clinical laboratory testing.    TRIMETH/SULFA Value in next row Sensitive      2 SENSITIVEThis is Hairo Garraway modified FDA-approved test that has been validated and its performance characteristics determined by the reporting laboratory.  This laboratory is certified under the Clinical Laboratory Improvement Amendments CLIA as qualified to perform high complexity clinical laboratory testing.    AMPICILLIN/SULBACTAM Value in next row Sensitive      2 SENSITIVEThis is Avigdor Dollar  modified FDA-approved test that has been validated and its performance characteristics determined by the reporting laboratory.  This laboratory is certified under the Clinical Laboratory Improvement Amendments CLIA as qualified to perform high complexity clinical laboratory testing.    PIP/TAZO Value in next row Sensitive      <=4 SENSITIVEThis is Shyteria Lewis modified FDA-approved test that has been validated and its performance characteristics determined by the reporting laboratory.  This laboratory is certified under the Clinical Laboratory Improvement Amendments CLIA as qualified to perform high complexity clinical laboratory testing.    MEROPENEM Value in next row Sensitive      <=4 SENSITIVEThis is Brighten Orndoff modified FDA-approved test that has been validated and its performance characteristics determined by the reporting laboratory.  This laboratory is certified under the Clinical Laboratory Improvement Amendments CLIA as qualified to perform high complexity clinical laboratory testing.    * >=100,000 COLONIES/mL KLEBSIELLA PNEUMONIAE  Blood Culture (routine x 2)     Status: None (Preliminary result)   Collection Time: 03/17/24 10:45 AM   Specimen: BLOOD  Result Value Ref Range Status   Specimen Description   Final    BLOOD LEFT ANTECUBITAL Performed at [facility], [address], [place], [place] [address]    Special Requests   Final    BOTTLES DRAWN AEROBIC AND ANAEROBIC Blood Culture adequate volume Performed at [facility], [address] [place], [place] [address]    Culture   Final    NO GROWTH < 24 HOURS Performed at [facility], [address], [place], [place] [address]    Report Status PENDING  Incomplete  Blood Culture (routine x 2)     Status: None (Preliminary result)   Collection Time: 03/17/24 10:53 AM   Specimen: BLOOD RIGHT ARM  Result Value Ref Range Status   Specimen Description   Final    BLOOD RIGHT ARM Performed at [facility], [address], [place], [place] [address]    Special Requests   Final    BOTTLES DRAWN AEROBIC AND ANAEROBIC Blood Culture adequate volume Performed at [facility], [address], [place], [place] [address]    Culture   Final    NO GROWTH < 24 HOURS Performed at [facility], [address] [place], [place] [address]    Report Status PENDING  Incomplete  Resp  panel by RT-PCR (RSV, Flu Dashawn Bartnick&B, Covid) Anterior Nasal Swab     Status: None   Collection Time: 03/17/24 11:53 AM   Specimen: Anterior Nasal Swab  Result Value Ref Range Status   SARS Coronavirus 2 by RT PCR NEGATIVE NEGATIVE Final    Comment: (NOTE) SARS-CoV-2 target nucleic acids are NOT DETECTED.  The SARS-CoV-2 RNA is generally detectable in upper respiratory specimens during the acute phase of infection. The lowest concentration of SARS-CoV-2 viral copies this assay can detect is 138 copies/mL. Aris Even negative result does not preclude SARS-Cov-2 infection and should not be used as the sole basis for treatment or other patient management decisions. Evangaline Jou negative result may occur with  improper specimen collection/handling, submission of specimen other than nasopharyngeal swab, presence of viral mutation(s) within the areas targeted by this assay, and inadequate number of viral copies(<138 copies/mL). Freedom Lopezperez negative result must be combined with clinical observations, patient history, and epidemiological information. The expected result is Negative.  Fact Sheet for Patients:  bloggercourse.com  Fact Sheet for Healthcare Providers:  seriousbroker.it  This test is no t yet approved or cleared by the [place]  FDA and  has been authorized for detection and/or diagnosis of SARS-CoV-2 by FDA under an Emergency Use Authorization (EUA). This EUA will remain  in effect (meaning this test can be used) for the duration of the COVID-19 declaration under  Section 564(b)(1) of the Act, 21 U.S.C.section 360bbb-3(b)(1), unless the authorization is terminated  or revoked sooner.       Influenza Kyrstal Monterrosa by PCR NEGATIVE NEGATIVE Final   Influenza B by PCR NEGATIVE NEGATIVE Final    Comment: (NOTE) The Xpert Xpress SARS-CoV-2/FLU/RSV plus assay is intended as an aid in the diagnosis of influenza from Nasopharyngeal swab specimens and should not be used as Keerat Denicola sole basis for treatment. Nasal washings and aspirates are unacceptable for Xpert Xpress SARS-CoV-2/FLU/RSV testing.  Fact Sheet for Patients: bloggercourse.com  Fact Sheet for Healthcare Providers: seriousbroker.it  This test is not yet approved or cleared by the [place]  FDA and has been authorized for detection and/or diagnosis of SARS-CoV-2 by FDA under an Emergency Use Authorization (EUA). This EUA will remain in effect (meaning this test can be used) for the duration of the COVID-19 declaration under Section 564(b)(1) of the Act, 21 U.S.C. section 360bbb-3(b)(1), unless the authorization is terminated or revoked.     Resp Syncytial Virus by PCR NEGATIVE NEGATIVE Final    Comment: (NOTE) Fact Sheet for Patients: bloggercourse.com  Fact Sheet for Healthcare Providers: seriousbroker.it  This test is not yet approved or cleared by the [place]  FDA and has been authorized for detection and/or diagnosis of SARS-CoV-2 by FDA under an Emergency Use Authorization (EUA). This EUA will remain in effect (meaning this test can be used) for the duration of the COVID-19 declaration under Section 564(b)(1) of the Act, 21 U.S.C. section 360bbb-3(b)(1), unless the authorization is terminated or revoked.  Performed at [facility], [address], [place], [place] [address]          Radiology Studies: CT Renal Stone Study Result Date: 03/17/2024 CLINICAL DATA:   Several day history of abdominal pain with nausea and vomiting. Known distal left ureteral stone with worsening weakness and fever EXAM: CT ABDOMEN AND PELVIS WITHOUT CONTRAST TECHNIQUE: Multidetector CT imaging of the abdomen and pelvis was performed following the standard protocol without IV contrast. RADIATION DOSE REDUCTION: This exam was performed according to the departmental dose-optimization program which includes automated exposure  control, adjustment of the mA and/or kV according to patient size and/or use of iterative reconstruction technique. COMPARISON:  CT abdomen and pelvis dated 03/15/2024 FINDINGS: Lower chest: No focal consolidation or pulmonary nodule in the lung bases. No pleural effusion or pneumothorax demonstrated. Partially imaged heart size is normal. Hepatobiliary: No focal hepatic lesions. No intra or extrahepatic biliary ductal dilation. Subtle layering hyperattenuation within the gallbladder, which may represent sludge/stones. Pancreas: No focal lesions or main ductal dilation. Spleen: Normal in size without focal abnormality. Adrenals/Urinary Tract: No adrenal nodules. Unchanged 5.8 cm simple right upper pole renal cyst. Asymmetric left perinephric and proximal periureteral stranding. No hydronephrosis. Punctate nonobstructing left lower pole renal stone. Dependent 5 mm calculus within the right posterior urinary bladder (2:80). Stomach/Bowel: Normal appearance of the stomach. No evidence of bowel wall thickening, distention, or inflammatory changes. Normal appendix. Vascular/Lymphatic: No significant vascular findings are present. Slightly increased size of 1.3 cm left para-aortic lymph node (2:30), previously 9 mm. Reproductive: No adnexal masses.  Bilateral adnexal clips. Other: No free fluid, fluid collection, or free air. Musculoskeletal: No acute or abnormal lytic or blastic osseous lesions. Retained stimulator lead in the right S3-4 neural foramen. IMPRESSION: 1. Interval  passage of left distal ureteral stone, now located within the dependent right posterior urinary bladder. 2. Asymmetric left perinephric and proximal periureteral stranding, which may be related to recently passed stone or ascending urinary tract infection. 3. Punctate nonobstructing left lower pole renal stone. 4. Slightly increased size of 1.3 cm left para-aortic lymph node, previously 9 mm, likely reactive. Electronically Signed   By: Limin  Xu M.D.   On: 03/17/2024 13:25        Scheduled Meds:  hydrochlorothiazide   12.5 mg Oral Daily   tamsulosin   0.4 mg Oral Daily   Continuous Infusions:   ceFAZolin  (ANCEF ) IV     lactated ringers  125 mL/hr at 03/18/24 1250     LOS: 1 day    Time spent: over 30 min     Meliton Monte, MD [facility]   To contact the attending provider between 7A-7P or the covering provider during after hours 7P-7A, please log into the web site [email or web address] and access using universal [place] password for that web site. If you do not have the password, please call the hospital operator.  03/18/2024, 3:13 PM    "

## 2024-03-18 NOTE — Plan of Care (Signed)

## 2024-03-18 NOTE — Progress Notes (Signed)
" °   03/18/24 1036  TOC Brief Assessment  Insurance and Status Reviewed  Patient has primary care physician Yes  Home environment has been reviewed resides in a private residence  Prior level of function: Independent  Prior/Current Home Services No current home services  Social Drivers of Health Review SDOH reviewed no interventions necessary  Readmission risk has been reviewed Yes  Transition of care needs no transition of care needs at this time    "

## 2024-03-19 ENCOUNTER — Other Ambulatory Visit (HOSPITAL_COMMUNITY): Payer: Self-pay

## 2024-03-19 ENCOUNTER — Telehealth (HOSPITAL_BASED_OUTPATIENT_CLINIC_OR_DEPARTMENT_OTHER): Payer: Self-pay | Admitting: *Deleted

## 2024-03-19 LAB — BLOOD CULTURE ID PANEL (REFLEXED) - BCID2

## 2024-03-19 LAB — COMPREHENSIVE METABOLIC PANEL WITH GFR
ALT: 38 U/L (ref 0–44)
AST: 30 U/L (ref 15–41)
Albumin: 3.1 g/dL — ABNORMAL LOW (ref 3.5–5.0)
Alkaline Phosphatase: 53 U/L (ref 38–126)
Anion gap: 10 (ref 5–15)
BUN: <5 mg/dL — ABNORMAL LOW (ref 6–20)
CO2: 26 mmol/L (ref 22–32)
Calcium: 8.2 mg/dL — ABNORMAL LOW (ref 8.9–10.3)
Chloride: 102 mmol/L (ref 98–111)
Creatinine, Ser: 0.64 mg/dL (ref 0.44–1.00)
GFR, Estimated: >60 mL/min
Glucose, Bld: 102 mg/dL — ABNORMAL HIGH (ref 70–99)
Potassium: 3.4 mmol/L — ABNORMAL LOW (ref 3.5–5.1)
Sodium: 139 mmol/L (ref 135–145)
Total Bilirubin: 0.3 mg/dL (ref 0.0–1.2)
Total Protein: 5.8 g/dL — ABNORMAL LOW (ref 6.5–8.1)

## 2024-03-19 LAB — CBC WITH DIFFERENTIAL/PLATELET
Abs Immature Granulocytes: 0.01 10*3/uL (ref 0.00–0.07)
Basophils Absolute: 0 10*3/uL (ref 0.0–0.1)
Basophils Relative: 0 %
Eosinophils Absolute: 0 10*3/uL (ref 0.0–0.5)
Eosinophils Relative: 1 %
HCT: 33.6 % — ABNORMAL LOW (ref 36.0–46.0)
Hemoglobin: 11.6 g/dL — ABNORMAL LOW (ref 12.0–15.0)
Immature Granulocytes: 0 %
Lymphocytes Relative: 15 %
Lymphs Abs: 0.7 10*3/uL (ref 0.7–4.0)
MCH: 26.1 pg (ref 26.0–34.0)
MCHC: 34.5 g/dL (ref 30.0–36.0)
MCV: 75.7 fL — ABNORMAL LOW (ref 80.0–100.0)
Monocytes Absolute: 0.4 10*3/uL (ref 0.1–1.0)
Monocytes Relative: 9 %
Neutro Abs: 3.3 10*3/uL (ref 1.7–7.7)
Neutrophils Relative %: 75 %
Platelets: 194 10*3/uL (ref 150–400)
RBC: 4.44 MIL/uL (ref 3.87–5.11)
RDW: 14.2 % (ref 11.5–15.5)
WBC: 4.5 10*3/uL (ref 4.0–10.5)
nRBC: 0 % (ref 0.0–0.2)

## 2024-03-19 LAB — URINE CULTURE: Culture: <10000 — AB

## 2024-03-19 LAB — MAGNESIUM: Magnesium: 1.8 mg/dL (ref 1.7–2.4)

## 2024-03-19 LAB — PHOSPHORUS: Phosphorus: 1.6 mg/dL — ABNORMAL LOW (ref 2.5–4.6)

## 2024-03-19 MED ORDER — K PHOS MONO-SOD PHOS DI & MONO 155-852-130 MG PO TABS
500.0000 mg | ORAL_TABLET | Freq: Four times a day (QID) | ORAL | Status: DC
  Administered 2024-03-19 (×2): 500 mg via ORAL
  Filled 2024-03-19 (×2): qty 2

## 2024-03-19 MED ORDER — PROCHLORPERAZINE EDISYLATE 10 MG/2ML IJ SOLN: 10.0000 mg | Freq: Once | INTRAMUSCULAR | Status: DC

## 2024-03-19 MED ORDER — BUTALBITAL-APAP-CAFFEINE 50-325-40 MG PO TABS
1.0000 | ORAL_TABLET | Freq: Once | ORAL | Status: AC
  Administered 2024-03-19: 1 via ORAL
  Filled 2024-03-19: qty 1

## 2024-03-19 MED ORDER — POTASSIUM CHLORIDE CRYS ER 10 MEQ PO TBCR
10.0000 meq | EXTENDED_RELEASE_TABLET | Freq: Every day | ORAL | 0 refills | Status: AC
  Filled 2024-03-19: qty 30, 30d supply, fill #0

## 2024-03-19 MED ORDER — BUTALBITAL-APAP-CAFFEINE 50-325-40 MG PO TABS
1.0000 | ORAL_TABLET | Freq: Every day | ORAL | 0 refills | Status: AC | PRN
  Filled 2024-03-19: qty 10, 10d supply, fill #0

## 2024-03-19 MED ORDER — CEFADROXIL 500 MG PO CAPS
1000.0000 mg | ORAL_CAPSULE | Freq: Two times a day (BID) | ORAL | 0 refills | Status: DC
  Filled 2024-03-19: qty 20, 5d supply, fill #0

## 2024-03-19 MED ORDER — ACETAMINOPHEN 325 MG PO TABS: 650.0000 mg | ORAL_TABLET | Freq: Four times a day (QID) | ORAL | Status: AC | PRN

## 2024-03-19 MED ORDER — POTASSIUM CHLORIDE CRYS ER 20 MEQ PO TBCR
40.0000 meq | EXTENDED_RELEASE_TABLET | Freq: Once | ORAL | Status: AC
  Administered 2024-03-19: 40 meq via ORAL
  Filled 2024-03-19: qty 2

## 2024-03-19 MED ORDER — K PHOS MONO-SOD PHOS DI & MONO 155-852-130 MG PO TABS
500.0000 mg | ORAL_TABLET | Freq: Four times a day (QID) | ORAL | 0 refills | Status: AC
  Filled 2024-03-19: qty 8, 1d supply, fill #0

## 2024-03-19 MED ORDER — LACTATED RINGERS IV BOLUS: 250.0000 mL | Freq: Once | INTRAVENOUS | Status: DC

## 2024-03-19 MED ORDER — CEFADROXIL 500 MG PO CAPS
1000.0000 mg | ORAL_CAPSULE | Freq: Two times a day (BID) | ORAL | 0 refills | Status: AC
  Filled 2024-03-19: qty 28, 7d supply, fill #0

## 2024-03-19 MED ORDER — DIPHENHYDRAMINE HCL 25 MG PO CAPS: 25.0000 mg | ORAL_CAPSULE | Freq: Once | ORAL | Status: DC

## 2024-03-19 NOTE — Progress Notes (Signed)
 Discharge medications delivered to patient at the bedside in a secure bag.

## 2024-03-19 NOTE — Progress Notes (Signed)
 Discharge instructions reviewed with patient and patient's spouse, verbalized understanding. All questions answered. All belongings accounted for. Patient to follow up with MD in  1 week. PIV x2 removed. Assisted via WC to private vehicle.

## 2024-03-19 NOTE — Telephone Encounter (Signed)
 Post ED Visit - Positive Culture Follow-up  Culture report reviewed by antimicrobial stewardship pharmacist: [facility] [x]  Rankin Sams, [place].D. []  Venetia Gully, Pharm.D., BCPS AQ-ID []  Garrel Crews, Pharm.D., BCPS []  Almarie Lunger, [address].D., BCPS []  [place], [place].D., BCPS, AAHIVP []  Rosaline Bihari, Pharm.D., BCPS, AAHIVP []  Vernell Meier, PharmD, BCPS []  Latanya Hint, PharmD, BCPS []  Donald Medley, PharmD, BCPS []  Rocky Bold, PharmD []  Dorothyann Alert, PharmD, BCPS []  Morene Babe, PharmD  [facility] []  Rosaline Edison, PharmD []  Romona Bliss, PharmD []  Dolphus Roller, PharmD []  Veva Seip, Rph []  Vernell Daunt) Leonce, PharmD []  Eva Allis, PharmD []  Rosaline Millet, PharmD []  Iantha Batch, PharmD []  Arvin Gauss, PharmD []  Wanda Hasting, PharmD []  Ronal Rav, PharmD []  Rocky Slade, PharmD []  Bard Jeans, PharmD   Positive urine culture Pt is currently admitted at [facility] and being treated for UTI.  No further patient follow-up is required at this time.  Lorita Barnie Pereyra 03/19/2024, 12:48 PM

## 2024-03-19 NOTE — Progress Notes (Signed)
 PHARMACY - PHYSICIAN COMMUNICATION CRITICAL VALUE ALERT - BLOOD CULTURE IDENTIFICATION (BCID)  Hayley Calhoun is an 39 y.o. female who presented to [facility] on [facility]/17/2024 with a chief complaint of flank pain & hematuria.  Assessment:  1/4 bottles Klebsiella pneumoniae, no resistance  Name of physician (or Provider) Contacted: C. Powell via secure chat  Current antibiotics: cefazolin > cefadroxil   Changes to prescribed antibiotics recommended:  Patient is on recommended antibiotics - No changes needed  Results for orders placed or performed during the hospital encounter of 03/17/24  Blood Culture ID Panel (Reflexed) (Collected: 03/17/2024 10:45 AM)  Result Value Ref Range   Enterococcus faecalis NOT DETECTED NOT DETECTED   Enterococcus Faecium NOT DETECTED NOT DETECTED   Listeria monocytogenes NOT DETECTED NOT DETECTED   Staphylococcus species NOT DETECTED NOT DETECTED   Staphylococcus aureus (BCID) NOT DETECTED NOT DETECTED   Staphylococcus epidermidis NOT DETECTED NOT DETECTED   Staphylococcus lugdunensis NOT DETECTED NOT DETECTED   Streptococcus species NOT DETECTED NOT DETECTED   Streptococcus agalactiae NOT DETECTED NOT DETECTED   Streptococcus pneumoniae NOT DETECTED NOT DETECTED   Streptococcus pyogenes NOT DETECTED NOT DETECTED   A.calcoaceticus-baumannii NOT DETECTED NOT DETECTED   Bacteroides fragilis NOT DETECTED NOT DETECTED   Enterobacterales DETECTED (A) NOT DETECTED   Enterobacter cloacae complex NOT DETECTED NOT DETECTED   Escherichia coli NOT DETECTED NOT DETECTED   Klebsiella aerogenes NOT DETECTED NOT DETECTED   Klebsiella oxytoca NOT DETECTED NOT DETECTED   Klebsiella pneumoniae DETECTED (A) NOT DETECTED   Proteus species NOT DETECTED NOT DETECTED   Salmonella species NOT DETECTED NOT DETECTED   Serratia marcescens NOT DETECTED NOT DETECTED   Haemophilus influenzae NOT DETECTED NOT DETECTED   Neisseria meningitidis NOT DETECTED NOT DETECTED    Pseudomonas aeruginosa NOT DETECTED NOT DETECTED   Stenotrophomonas maltophilia NOT DETECTED NOT DETECTED   Candida albicans NOT DETECTED NOT DETECTED   Candida auris NOT DETECTED NOT DETECTED   Candida glabrata NOT DETECTED NOT DETECTED   Candida krusei NOT DETECTED NOT DETECTED   Candida parapsilosis NOT DETECTED NOT DETECTED   Candida tropicalis NOT DETECTED NOT DETECTED   Cryptococcus neoformans/gattii NOT DETECTED NOT DETECTED   CTX-M ESBL NOT DETECTED NOT DETECTED   Carbapenem resistance IMP NOT DETECTED NOT DETECTED   Carbapenem resistance KPC NOT DETECTED NOT DETECTED   Carbapenem resistance NDM NOT DETECTED NOT DETECTED   Carbapenem resist OXA 48 LIKE NOT DETECTED NOT DETECTED   Carbapenem resistance VIM NOT DETECTED NOT DETECTED    Rosaline IVAR Edison, Pharm.D Use secure chat for questions 03/19/2024 2:22 PM

## 2024-03-19 NOTE — Discharge Summary (Signed)
 Physician Discharge Summary  Hayley Calhoun FMW:[id number] DOB: 12/08/1985 DOA: 03/17/2024  PCP: Medicine, [facility] (Inactive)  Admit date: 03/17/2024 Discharge date: 03/19/2024  Time spent: 40 minutes  Recommendations for Outpatient Follow-up:  Follow outpatient CBC/CMP  Follow final blood culture - gram negative bacteremia - follow final sensitivities, discharged on duricef 1 gram BID dosing x7 days Repeat CXR in 2-3 weeks Follow hypokalemia outpatient, discharged on kcl Follow blood pressure outpatient Follow migraine outpatient  Discharge Diagnoses:  Principal Problem:   Sepsis secondary to UTI [facility])   Discharge Condition: stable  Diet recommendation: heart healthy   Filed Weights   03/17/24 1016  Weight: 78.5 kg    History of present illness:   Hayley Calhoun is [facility] 39 y.o. female with medical history significant for urinary retention and frequent UTI, hypertension being admitted to the hospital with sepsis due to pyelonephritis in the setting of nephrolithiasis.   She's improved on culture specific antibiotics for the klebsiella UTI noted on 2/1.  On day of discharge, blood cultures showed gram negative bacteremia.  At this time, notable fever free almost 24 hrs on ancef .  Gram negative bacteremia likely with urinary source given presentation and klebsiella on 2/1 culture is likely culprit.  Will continue plan for discharge with outpatient follow up.   Hospital Course:  Assessment and Plan:  Sepsis-meeting criteria with tachycardia, fever.  Source is pyelonephritis.  No leukocytosis, no evidence of endorgan dysfunction, lactate is normal. As below    Pyelonephritis Klebsiella Pneumoniae UTI Gram Negative Bacteremia Culture from 2/1 with klebsiella sensitive to ancef  1/2 sets on 03/17/2024 with gram negative bacteremia (suspect this is klebsiella given the urine culture from 2/1) BCID pending.  I think ok for her to discharge given  improvement on ancef .  Gram negative bacteremia on the blood culture is most likely going to reflect klebsiella that was noted on her urine culture.  Follow final blood cultures and sensitivities Narrow as tolerated   Nephrolithiasis  patient has [facility] known left-sided kidney stone which was obstructing on 2/1, and has now progressed into the bladder. Needs urology follow up outpatient   Pneumonia CXR with RLL pneumonia Unclear clinical significance, had coughing spell yesterday, but not much today in terms of respiratory symptoms Continue abx with duricef alone for now given overall improvement Follow repeat CXR outpatient   Migraine Analgesics   Lightheadedness Resolved with IVF   Hypokalemia Hypophosphatemia Will discharge with potassium supplementation given chronic thiazide   Hypertension Resume thiazide   Urinary retention continue self-catheterization  Needs outpatient urology follow up, [facility]   Hx spina bifida  noted    Procedures: none   Consultations: none  Discharge Exam: Vitals:   03/19/24 0440 03/19/24 0859  BP: (!) 147/93 (!) 131/91  Pulse: 77 91  Resp: 18 18  Temp: 99.4 F (37.4 C) 98.8 F (37.1 C)  SpO2: 99% 99%   C/o 4/10 HA this morning - improved after fioricet  No new complaints otherwise, feels better overall  General: No acute distress. Cardiovascular: RRR Lungs: faint crackles at R base Abdomen: Soft, nontender, nondistended No CVA tenderness Neurological: Alert and oriented 3. Moves all extremities 4 with equal strength. Cranial nerves II through XII grossly intact. Skin: Warm and dry. No rashes or lesions. Extremities: No clubbing or cyanosis. No edema  Discharge Instructions   Discharge Instructions     Call MD for:  difficulty breathing, headache or visual disturbances   Complete by: As  directed    Call MD for:  extreme fatigue   Complete by: As directed    Call MD for:  hives   Complete by: As  directed    Call MD for:  persistant dizziness or light-headedness   Complete by: As directed    Call MD for:  persistant nausea and vomiting   Complete by: As directed    Call MD for:  redness, tenderness, or signs of infection (pain, swelling, redness, odor or green/yellow discharge around incision site)   Complete by: As directed    Call MD for:  severe uncontrolled pain   Complete by: As directed    Call MD for:  temperature >100.4   Complete by: As directed    Discharge instructions   Complete by: As directed    You were seen for pyelonephritis ([facility] kidney infection or urine infection).  You've improved with antibiotics and the obstructing stone has passed into the bladder.  You should arrange [facility] follow up appointment with urology as an outpatient.   We'll send you home with antibiotics to complete your treatment for your urine infection.   It looks like your urine infection caused [facility] blood infection.  The preliminary results show [facility] gram negative bacteria in your blood.  We'll need to watch this culture result until the results finalize, but I think with the culture data from your urine that we have, we have the most likely cause of this infection and they're likely to be the same.  I'll follow up with you regarding the final results later this week (if you don't here from me, call back up to the unit in 2-3 days).  You also had [facility] chest x ray with findings consistent with pneumonia.  I think this is less clinically significant than your kidney infection or urine infection (UTI), but the antibiotics we treat you with for your UTI should be appropriate for the pneumonia as well.  Follow up with your PCP as an outpatient and discuss repeating [facility] chest x ray in [facility] few weeks.  Your headache is likely related to your systemic illness (your urine infection as well as your pneumonia).  Use tylenol  or ibuprofen  as needed for your headaches.  I'll send you with [facility] few doses of fioricet  which you can use  for headaches that persist despite tylenol  and ibuprofen , but try to use this no more than twice [facility] week.  I'll start you on potassium.  Take this daily as you take your hydrochlorothiazide .  You should get repeat labs in about 1 week to make sure things are stable.   You had an enlarged lymph node that is likely related to your urine infection.  Follow up with your PCP outpatient to discuss follow up imaging.   Return for new, recurrent, or worsening symptoms.  Please ask your PCP to request records from this hospitalization so they know what was done and what the next steps will be.   Increase activity slowly   Complete by: As directed       Allergies as of 03/19/2024       Reactions   Nitrofurantoin  Anaphylaxis, Shortness Of Breath, Nausea And Vomiting, Other (See Comments)   Difficulty breathing with subsequent ER visit- Unable to tolerate higher dose than 50 mg (per pt)   Penicillins Itching, Dermatitis   Sulfa Antibiotics Anaphylaxis, Shortness Of Breath, Itching   Sulfamethizole Anaphylaxis, Shortness Of Breath, Itching   Keflex  [cephalexin ] Nausea And Vomiting  Medication List     STOP taking these medications    HYDROcodone -acetaminophen  5-325 MG tablet Commonly known as: Norco   ketorolac  10 MG tablet Commonly known as: TORADOL    tamsulosin  0.4 MG Caps capsule Commonly known as: FLOMAX        TAKE these medications    acetaminophen  325 MG tablet Commonly known as: TYLENOL  Take 2 tablets (650 mg total) by mouth every 6 (six) hours as needed. What changed:  medication strength how much to take reasons to take this   albuterol  108 (90 Base) MCG/ACT inhaler Commonly known as: VENTOLIN  HFA Inhale 2 puffs into the lungs every 4 (four) hours as needed for wheezing or shortness of breath.   butalbital -acetaminophen -caffeine  50-325-40 MG tablet Commonly known as: FIORICET  Take 1 tablet by mouth daily as needed for headache. (Use for headache not improved  with tylenol  or ibuprofen .  Avoid taking more than twice weekly to avoid rebound headaches).   cefadroxil  500 MG capsule Commonly known as: DURICEF Take 2 capsules (1,000 mg total) by mouth 2 (two) times daily for 7 days.   hydrochlorothiazide  12.5 MG capsule Commonly known as: MICROZIDE  Take 12.5 mg by mouth daily.   phosphorus 155-852-130 MG tablet Commonly known as: K PHOS  NEUTRAL Take 2 tablets (500 mg total) by mouth 4 (four) times daily for 1 day.   potassium chloride  10 MEQ tablet Commonly known as: KLOR-CON  M Take 1 tablet (10 mEq total) by mouth daily.       Allergies[1]    The results of significant diagnostics from this hospitalization (including imaging, microbiology, ancillary and laboratory) are listed below for reference.    Significant Diagnostic Studies: DG CHEST PORT 1 VIEW Result Date: 03/18/2024 EXAM: 1 VIEW(S) XRAY OF THE CHEST 03/18/2024 04:00:00 PM COMPARISON: None available. CLINICAL HISTORY: Cough. FINDINGS: LUNGS AND PLEURA: Hazy airspace opacity in right lower lung. Linear atelectasis versus scarring at left lung base. No pleural effusion. No pneumothorax. HEART AND MEDIASTINUM: No acute abnormality of the cardiac and mediastinal silhouettes. BONES AND SOFT TISSUES: No acute osseous abnormality. IMPRESSION: 1. Right lower lung pneumonia. Electronically signed by: Norman Gatlin MD 03/18/2024 07:39 PM EST RP Workstation: [id number]   CT Renal Stone Study Result Date: 03/17/2024 CLINICAL DATA:  Several day history of abdominal pain with nausea and vomiting. Known distal left ureteral stone with worsening weakness and fever EXAM: CT ABDOMEN AND PELVIS WITHOUT CONTRAST TECHNIQUE: Multidetector CT imaging of the abdomen and pelvis was performed following the standard protocol without IV contrast. RADIATION DOSE REDUCTION: This exam was performed according to the departmental dose-optimization program which includes automated exposure control, adjustment of the mA  and/or kV according to patient size and/or use of iterative reconstruction technique. COMPARISON:  CT abdomen and pelvis dated 03/15/2024 FINDINGS: Lower chest: No focal consolidation or pulmonary nodule in the lung bases. No pleural effusion or pneumothorax demonstrated. Partially imaged heart size is normal. Hepatobiliary: No focal hepatic lesions. No intra or extrahepatic biliary ductal dilation. Subtle layering hyperattenuation within the gallbladder, which may represent sludge/stones. Pancreas: No focal lesions or main ductal dilation. Spleen: Normal in size without focal abnormality. Adrenals/Urinary Tract: No adrenal nodules. Unchanged 5.8 cm simple right upper pole renal cyst. Asymmetric left perinephric and proximal periureteral stranding. No hydronephrosis. Punctate nonobstructing left lower pole renal stone. Dependent 5 mm calculus within the right posterior urinary bladder (2:80). Stomach/Bowel: Normal appearance of the stomach. No evidence of bowel wall thickening, distention, or inflammatory changes. Normal appendix. Vascular/Lymphatic: No significant vascular findings are present.  Slightly increased size of 1.3 cm left para-aortic lymph node (2:30), previously 9 mm. Reproductive: No adnexal masses.  Bilateral adnexal clips. Other: No free fluid, fluid collection, or free air. Musculoskeletal: No acute or abnormal lytic or blastic osseous lesions. Retained stimulator lead in the right S3-4 neural foramen. IMPRESSION: 1. Interval passage of left distal ureteral stone, now located within the dependent right posterior urinary bladder. 2. Asymmetric left perinephric and proximal periureteral stranding, which may be related to recently passed stone or ascending urinary tract infection. 3. Punctate nonobstructing left lower pole renal stone. 4. Slightly increased size of 1.3 cm left para-aortic lymph node, previously 9 mm, likely reactive. Electronically Signed   By: Limin  Xu M.D.   On: 03/17/2024 13:25    CT Renal Stone Study Result Date: 03/15/2024 CLINICAL DATA:  Abdominal and flank pain EXAM: CT ABDOMEN AND PELVIS WITHOUT CONTRAST TECHNIQUE: Multidetector CT imaging of the abdomen and pelvis was performed following the standard protocol without IV contrast. RADIATION DOSE REDUCTION: This exam was performed according to the departmental dose-optimization program which includes automated exposure control, adjustment of the mA and/or kV according to patient size and/or use of iterative reconstruction technique. COMPARISON:  01/06/2016 FINDINGS: Lower chest: No acute pleural or parenchymal lung disease. Hepatobiliary: Unremarkable unenhanced appearance of the liver and gallbladder. Pancreas: Unremarkable unenhanced appearance. Spleen: Unremarkable unenhanced appearance. Adrenals/Urinary Tract: There is an obstructing 7 mm distal left ureteral calculus, reference image 71/2. Moderate left-sided hydronephrosis and hydroureter. There is left renal edema and mild left perinephric fat stranding. Additional 3 mm nonobstructing calculus lower pole left kidney. Stable benign cyst upper pole right kidney does not require follow-up. No right renal calculi or obstruction. The bladder is decompressed with nonspecific bladder wall thickening. The adrenals are unremarkable. Stomach/Bowel: No bowel obstruction or ileus. Normal appendix right lower quadrant. No bowel wall thickening or inflammatory change. Vascular/Lymphatic: No significant vascular findings are present. No enlarged abdominal or pelvic lymph nodes. Reproductive: Uterus and bilateral adnexa are unremarkable. Jatia Musa left-sided tubal ligation clip is seen adjacent to the left adnexa. Suspected malpositioning of the right tubal ligation clip, which is seen in the cul-de-sac. Other: No free fluid or free intraperitoneal gas. No abdominal wall hernia. Musculoskeletal: No acute or destructive bony abnormalities. Stable remnant of nerve stimulator lead emanating from the  right S3-4 neural foramen. Reconstructed images demonstrate no additional findings. IMPRESSION: 1. Obstructing 7 mm distal left ureteral calculus, with moderate left-sided hydroureteronephrosis. 2. Additional nonobstructing 3 mm left renal calculus. Electronically Signed   By: Ozell Daring M.D.   On: 03/15/2024 17:57    Microbiology: Recent Results (from the past 240 hours)  Urine Culture     Status: Abnormal   Collection Time: 03/15/24  6:02 PM   Specimen: Urine, Clean Catch  Result Value Ref Range Status   Specimen Description   Final    URINE, CLEAN CATCH Performed at [facility], [address] [place], [place] [address]    Special Requests   Final    NONE Performed at [facility], [address] [place], [place] [address]    Culture >=100,000 COLONIES/mL KLEBSIELLA PNEUMONIAE (Kayal Mula)  Final   Report Status 03/18/2024 FINAL  Final   Organism ID, Bacteria KLEBSIELLA PNEUMONIAE (Nissa Stannard)  Final      Susceptibility   Klebsiella pneumoniae - MIC*    AMPICILLIN >=32 RESISTANT Resistant     CEFAZOLIN  (URINE) Value in next row Sensitive      2 SENSITIVEThis is Tawsha Terrero modified  FDA-approved test that has been validated and its performance characteristics determined by the reporting laboratory.  This laboratory is certified under the Clinical Laboratory Improvement Amendments CLIA as qualified to perform high complexity clinical laboratory testing.    CEFEPIME  Value in next row Sensitive      2 SENSITIVEThis is Kerisha Goughnour modified FDA-approved test that has been validated and its performance characteristics determined by the reporting laboratory.  This laboratory is certified under the Clinical Laboratory Improvement Amendments CLIA as qualified to perform high complexity clinical laboratory testing.    ERTAPENEM Value in next row Sensitive      2 SENSITIVEThis is Aliea Bobe modified FDA-approved test that has been validated and its performance characteristics determined by the  reporting laboratory.  This laboratory is certified under the Clinical Laboratory Improvement Amendments CLIA as qualified to perform high complexity clinical laboratory testing.    CEFTRIAXONE  Value in next row Sensitive      2 SENSITIVEThis is Tyreek Clabo modified FDA-approved test that has been validated and its performance characteristics determined by the reporting laboratory.  This laboratory is certified under the Clinical Laboratory Improvement Amendments CLIA as qualified to perform high complexity clinical laboratory testing.    CIPROFLOXACIN  Value in next row Resistant      2 SENSITIVEThis is Ameriah Lint modified FDA-approved test that has been validated and its performance characteristics determined by the reporting laboratory.  This laboratory is certified under the Clinical Laboratory Improvement Amendments CLIA as qualified to perform high complexity clinical laboratory testing.    GENTAMICIN  Value in next row Sensitive      2 SENSITIVEThis is Kveon Casanas modified FDA-approved test that has been validated and its performance characteristics determined by the reporting laboratory.  This laboratory is certified under the Clinical Laboratory Improvement Amendments CLIA as qualified to perform high complexity clinical laboratory testing.    NITROFURANTOIN  Value in next row Intermediate      2 SENSITIVEThis is Trimaine Maser modified FDA-approved test that has been validated and its performance characteristics determined by the reporting laboratory.  This laboratory is certified under the Clinical Laboratory Improvement Amendments CLIA as qualified to perform high complexity clinical laboratory testing.    TRIMETH/SULFA Value in next row Sensitive      2 SENSITIVEThis is Maximo Spratling modified FDA-approved test that has been validated and its performance characteristics determined by the reporting laboratory.  This laboratory is certified under the Clinical Laboratory Improvement Amendments CLIA as qualified to perform high complexity clinical  laboratory testing.    AMPICILLIN/SULBACTAM Value in next row Sensitive      2 SENSITIVEThis is Mendell Bontempo modified FDA-approved test that has been validated and its performance characteristics determined by the reporting laboratory.  This laboratory is certified under the Clinical Laboratory Improvement Amendments CLIA as qualified to perform high complexity clinical laboratory testing.    PIP/TAZO Value in next row Sensitive      <=4 SENSITIVEThis is Javis Abboud modified FDA-approved test that has been validated and its performance characteristics determined by the reporting laboratory.  This laboratory is certified under the Clinical Laboratory Improvement Amendments CLIA as qualified to perform high complexity clinical laboratory testing.    MEROPENEM Value in next row Sensitive      <=4 SENSITIVEThis is Byrant Valent modified FDA-approved test that has been validated and its performance characteristics determined by the reporting laboratory.  This laboratory is certified under the Clinical Laboratory Improvement Amendments CLIA as qualified to perform high complexity clinical laboratory testing.    * >=100,000 COLONIES/mL KLEBSIELLA PNEUMONIAE  Blood Culture (routine  x 2)     Status: None (Preliminary result)   Collection Time: 03/17/24 10:45 AM   Specimen: BLOOD  Result Value Ref Range Status   Specimen Description   Final    BLOOD LEFT ANTECUBITAL Performed at [facility], [address], [place], [place] [address]    Special Requests   Final    BOTTLES DRAWN AEROBIC AND ANAEROBIC Blood Culture adequate volume Performed at [facility], [address], [place], [place] [address]    Culture  Setup Time   Final    GRAM NEGATIVE RODS AEROBIC BOTTLE ONLY Organism ID to follow Performed at [facility], [address] [place], [place] [address]    Culture GRAM NEGATIVE RODS  Final   Report Status PENDING  Incomplete  Blood Culture (routine x 2)     Status: None  (Preliminary result)   Collection Time: 03/17/24 10:53 AM   Specimen: BLOOD RIGHT ARM  Result Value Ref Range Status   Specimen Description   Final    BLOOD RIGHT ARM Performed at [facility], [address] [place], [place] [address]    Special Requests   Final    BOTTLES DRAWN AEROBIC AND ANAEROBIC Blood Culture adequate volume Performed at [facility], [address], [place], [place] [address]    Culture   Final    NO GROWTH 2 DAYS Performed at [facility], [address], [place], [place] [address]    Report Status PENDING  Incomplete  Resp panel by RT-PCR (RSV, Flu Mackey Varricchio&B, Covid) Anterior Nasal Swab     Status: None   Collection Time: 03/17/24 11:53 AM   Specimen: Anterior Nasal Swab  Result Value Ref Range Status   SARS Coronavirus 2 by RT PCR NEGATIVE NEGATIVE Final    Comment: (NOTE) SARS-CoV-2 target nucleic acids are NOT DETECTED.  The SARS-CoV-2 RNA is generally detectable in upper respiratory specimens during the acute phase of infection. The lowest concentration of SARS-CoV-2 viral copies this assay can detect is 138 copies/mL. Danita Proud negative result does not preclude SARS-Cov-2 infection and should not be used as the sole basis for treatment or other patient management decisions. Julienne Vogler negative result may occur with  improper specimen collection/handling, submission of specimen other than nasopharyngeal swab, presence of viral mutation(s) within the areas targeted by this assay, and inadequate number of viral copies(<138 copies/mL). Amrie Gurganus negative result must be combined with clinical observations, patient history, and epidemiological information. The expected result is Negative.  Fact Sheet for Patients:  bloggercourse.com  Fact Sheet for Healthcare Providers:  seriousbroker.it  This test is no t yet approved or cleared by the [place]  FDA and  has been authorized for detection and/or  diagnosis of SARS-CoV-2 by FDA under an Emergency Use Authorization (EUA). This EUA will remain  in effect (meaning this test can be used) for the duration of the COVID-19 declaration under Section 564(b)(1) of the Act, 21 U.S.C.section 360bbb-3(b)(1), unless the authorization is terminated  or revoked sooner.       Influenza Yunus Stoklosa by PCR NEGATIVE NEGATIVE Final   Influenza B by PCR NEGATIVE NEGATIVE Final    Comment: (NOTE) The Xpert Xpress SARS-CoV-2/FLU/RSV plus assay is intended as an aid in the diagnosis of influenza from Nasopharyngeal swab specimens and should not be used as Zawadi Aplin sole basis for treatment. Nasal washings and aspirates are unacceptable for Xpert Xpress SARS-CoV-2/FLU/RSV testing.  Fact Sheet for Patients: bloggercourse.com  Fact  Sheet for Healthcare Providers: seriousbroker.it  This test is not yet approved or cleared by the [place]  FDA and has been authorized for detection and/or diagnosis of SARS-CoV-2 by FDA under an Emergency Use Authorization (EUA). This EUA will remain in effect (meaning this test can be used) for the duration of the COVID-19 declaration under Section 564(b)(1) of the Act, 21 U.S.C. section 360bbb-3(b)(1), unless the authorization is terminated or revoked.     Resp Syncytial Virus by PCR NEGATIVE NEGATIVE Final    Comment: (NOTE) Fact Sheet for Patients: bloggercourse.com  Fact Sheet for Healthcare Providers: seriousbroker.it  This test is not yet approved or cleared by the [place]  FDA and has been authorized for detection and/or diagnosis of SARS-CoV-2 by FDA under an Emergency Use Authorization (EUA). This EUA will remain in effect (meaning this test can be used) for the duration of the COVID-19 declaration under Section 564(b)(1) of the Act, 21 U.S.C. section 360bbb-3(b)(1), unless the authorization is terminated  or revoked.  Performed at [facility], [address] [place], [place] [address]   Urine Culture     Status: Abnormal   Collection Time: 03/17/24 11:54 AM   Specimen: Urine, Random  Result Value Ref Range Status   Specimen Description   Final    URINE, RANDOM Performed at [facility], [address], [place], [place] [address]    Special Requests   Final    NONE Reflexed from [id number] Performed at [facility], [address], [place], [place] [address]    Culture (Finnick Orosz)  Final    <10,000 COLONIES/mL INSIGNIFICANT GROWTH Performed at [facility], [address], [place], [place] [address]    Report Status 03/19/2024 FINAL  Final     Labs: Basic Metabolic Panel: Recent Labs  Lab 03/15/24 1625 03/17/24 1045 03/18/24 0600 03/18/24 0800 03/19/24 0551  NA 143 137 136  --  139  K 3.2* 2.8* 3.3*  --  3.4*  CL 103 101 103  --  102  CO2 27 27 27   --  26  GLUCOSE 114* 131* 121*  --  102*  BUN 9 8 <5*  --  <5*  CREATININE 0.85 0.78 0.62  --  0.64  CALCIUM 9.9 9.1 7.7*  --  8.2*  MG  --   --   --  1.7 1.8  PHOS  --   --   --   --  1.6*   Liver Function Tests: Recent Labs  Lab 03/15/24 1625 03/17/24 1045 03/19/24 0551  AST 35 51* 30  ALT 41 62* 38  ALKPHOS 57 59 53  BILITOT 0.4 0.9 0.3  PROT 7.6 6.7 5.8*  ALBUMIN 4.5 3.8 3.1*   Recent Labs  Lab 03/15/24 1625  LIPASE 26   No results for input(s): AMMONIA in the last 168 hours. CBC: Recent Labs  Lab 03/15/24 1625 03/17/24 1045 03/18/24 0600 03/19/24 0551  WBC 3.7* 10.0 5.9 4.5  NEUTROABS  --  9.0*  --  3.3  HGB 15.0 12.9 10.8* 11.6*  HCT 42.1 36.8 31.5* 33.6*  MCV 75.7* 75.6* 76.6* 75.7*  PLT 274 226 185 194   Cardiac Enzymes: No results for input(s): CKTOTAL, CKMB, CKMBINDEX, TROPONINI in the last 168 hours. BNP: BNP (last 3 results) No results for input(s): BNP in the last 8760 hours.  ProBNP (last 3 results) No results  for input(s): PROBNP in the last 8760 hours.  CBG: No results for input(s): GLUCAP  in the last 168 hours.     Signed:  Meliton Monte MD.  [facility] 03/19/2024, 2:08 PM       [1]  Allergies Allergen Reactions   Nitrofurantoin  Anaphylaxis, Shortness Of Breath, Nausea And Vomiting and Other (See Comments)    Difficulty breathing with subsequent ER visit- Unable to tolerate higher dose than 50 mg (per pt)   Penicillins Itching and Dermatitis   Sulfa Antibiotics Anaphylaxis, Shortness Of Breath and Itching   Sulfamethizole Anaphylaxis, Shortness Of Breath and Itching   Keflex  [Cephalexin ] Nausea And Vomiting

## 2024-03-20 LAB — CULTURE, BLOOD (ROUTINE X 2)
Culture  Setup Time: NO GROWTH
Culture: NO GROWTH
Special Requests: ADEQUATE
Special Requests: ADEQUATE
